# Patient Record
Sex: Male | Born: 1965
Health system: Southern US, Community
[De-identification: ages and names within clinical notes are randomized; demographics above are authoritative.]

## PROBLEM LIST (undated history)

## (undated) DIAGNOSIS — I4891 Unspecified atrial fibrillation: Secondary | ICD-10-CM

## (undated) DIAGNOSIS — F1411 Cocaine abuse, in remission: Secondary | ICD-10-CM

## (undated) DIAGNOSIS — Z720 Tobacco use: Secondary | ICD-10-CM

## (undated) DIAGNOSIS — F1010 Alcohol abuse, uncomplicated: Secondary | ICD-10-CM

## (undated) DIAGNOSIS — F32A Depression, unspecified: Secondary | ICD-10-CM

## (undated) DIAGNOSIS — I480 Paroxysmal atrial fibrillation: Secondary | ICD-10-CM

## (undated) DIAGNOSIS — I639 Cerebral infarction, unspecified: Secondary | ICD-10-CM

## (undated) DIAGNOSIS — I10 Essential (primary) hypertension: Secondary | ICD-10-CM

## (undated) DIAGNOSIS — E119 Type 2 diabetes mellitus without complications: Secondary | ICD-10-CM

## (undated) DIAGNOSIS — F319 Bipolar disorder, unspecified: Secondary | ICD-10-CM

## (undated) DIAGNOSIS — I209 Angina pectoris, unspecified: Secondary | ICD-10-CM

## (undated) DIAGNOSIS — F329 Major depressive disorder, single episode, unspecified: Secondary | ICD-10-CM

## (undated) DIAGNOSIS — E7800 Pure hypercholesterolemia, unspecified: Secondary | ICD-10-CM

## (undated) HISTORY — PX: INGUINAL HERNIA REPAIR: SUR1180

## (undated) HISTORY — PX: CYSTECTOMY: SUR359

---

## 2002-06-18 DIAGNOSIS — I639 Cerebral infarction, unspecified: Secondary | ICD-10-CM

## 2002-06-18 HISTORY — DX: Cerebral infarction, unspecified: I63.9

## 2002-08-27 ENCOUNTER — Ambulatory Visit (HOSPITAL_BASED_OUTPATIENT_CLINIC_OR_DEPARTMENT_OTHER): Admission: RE | Admit: 2002-08-27 | Discharge: 2002-08-27 | Payer: Self-pay | Admitting: General Surgery

## 2002-08-27 ENCOUNTER — Encounter (INDEPENDENT_AMBULATORY_CARE_PROVIDER_SITE_OTHER): Payer: Self-pay | Admitting: Specialist

## 2004-03-26 ENCOUNTER — Emergency Department (HOSPITAL_COMMUNITY): Admission: EM | Admit: 2004-03-26 | Discharge: 2004-03-26 | Payer: Self-pay | Admitting: Emergency Medicine

## 2005-08-18 ENCOUNTER — Ambulatory Visit: Payer: Self-pay | Admitting: Cardiology

## 2005-08-18 ENCOUNTER — Inpatient Hospital Stay (HOSPITAL_COMMUNITY): Admission: EM | Admit: 2005-08-18 | Discharge: 2005-08-22 | Payer: Self-pay | Admitting: Emergency Medicine

## 2005-08-20 ENCOUNTER — Encounter (INDEPENDENT_AMBULATORY_CARE_PROVIDER_SITE_OTHER): Payer: Self-pay | Admitting: *Deleted

## 2005-12-14 ENCOUNTER — Emergency Department (HOSPITAL_COMMUNITY): Admission: EM | Admit: 2005-12-14 | Discharge: 2005-12-14 | Payer: Self-pay | Admitting: Emergency Medicine

## 2010-03-09 ENCOUNTER — Emergency Department (HOSPITAL_COMMUNITY): Admission: EM | Admit: 2010-03-09 | Discharge: 2010-03-09 | Payer: Self-pay | Admitting: Emergency Medicine

## 2010-05-24 ENCOUNTER — Emergency Department (HOSPITAL_COMMUNITY)
Admission: EM | Admit: 2010-05-24 | Discharge: 2010-05-24 | Payer: Self-pay | Source: Home / Self Care | Admitting: Family Medicine

## 2010-06-25 ENCOUNTER — Observation Stay (HOSPITAL_COMMUNITY)
Admission: EM | Admit: 2010-06-25 | Discharge: 2010-06-28 | Payer: Self-pay | Source: Home / Self Care | Attending: Internal Medicine | Admitting: Internal Medicine

## 2010-07-03 LAB — POCT I-STAT, CHEM 8
BUN: 15 mg/dL (ref 6–23)
Calcium, Ion: 1.1 mmol/L — ABNORMAL LOW (ref 1.12–1.32)
Chloride: 101 mEq/L (ref 96–112)
Creatinine, Ser: 1.1 mg/dL (ref 0.4–1.5)
Glucose, Bld: 104 mg/dL — ABNORMAL HIGH (ref 70–99)
HCT: 54 % — ABNORMAL HIGH (ref 39.0–52.0)
Hemoglobin: 18.4 g/dL — ABNORMAL HIGH (ref 13.0–17.0)
Potassium: 4.1 mEq/L (ref 3.5–5.1)
Sodium: 137 mEq/L (ref 135–145)
TCO2: 30 mmol/L (ref 0–100)

## 2010-07-03 LAB — BASIC METABOLIC PANEL
BUN: 14 mg/dL (ref 6–23)
BUN: 7 mg/dL (ref 6–23)
CO2: 31 mEq/L (ref 19–32)
CO2: 27 mEq/L (ref 19–32)
Calcium: 9.4 mg/dL (ref 8.4–10.5)
Calcium: 9.1 mg/dL (ref 8.4–10.5)
Chloride: 100 mEq/L (ref 96–112)
Chloride: 106 mEq/L (ref 96–112)
Creatinine, Ser: 0.82 mg/dL (ref 0.4–1.5)
Creatinine, Ser: 0.74 mg/dL (ref 0.4–1.5)
GFR calc Af Amer: >60 mL/min (ref >60)
GFR calc Af Amer: >60 mL/min (ref >60)
GFR calc non Af Amer: >60 mL/min (ref >60)
GFR calc non Af Amer: >60 mL/min (ref >60)
Glucose, Bld: 148 mg/dL — ABNORMAL HIGH (ref 70–99)
Glucose, Bld: 115 mg/dL — ABNORMAL HIGH (ref 70–99)
Potassium: 3.6 mEq/L (ref 3.5–5.1)
Potassium: 4 mEq/L (ref 3.5–5.1)
Sodium: 139 mEq/L (ref 135–145)
Sodium: 140 mEq/L (ref 135–145)

## 2010-07-03 LAB — COMPREHENSIVE METABOLIC PANEL
ALT: 16 U/L (ref 0–53)
ALT: 17 U/L (ref 0–53)
AST: 18 U/L (ref 0–37)
AST: 18 U/L (ref 0–37)
Albumin: 4.1 g/dL (ref 3.5–5.2)
Albumin: 4 g/dL (ref 3.5–5.2)
Alkaline Phosphatase: 66 U/L (ref 39–117)
Alkaline Phosphatase: 63 U/L (ref 39–117)
BUN: 12 mg/dL (ref 6–23)
BUN: 11 mg/dL (ref 6–23)
CO2: 26 mEq/L (ref 19–32)
CO2: 28 mEq/L (ref 19–32)
Calcium: 9.5 mg/dL (ref 8.4–10.5)
Calcium: 9.3 mg/dL (ref 8.4–10.5)
Chloride: 99 mEq/L (ref 96–112)
Chloride: 102 mEq/L (ref 96–112)
Creatinine, Ser: 0.98 mg/dL (ref 0.4–1.5)
Creatinine, Ser: 0.86 mg/dL (ref 0.4–1.5)
GFR calc Af Amer: >60 mL/min (ref >60)
GFR calc Af Amer: >60 mL/min (ref >60)
GFR calc non Af Amer: >60 mL/min (ref >60)
GFR calc non Af Amer: >60 mL/min (ref >60)
Glucose, Bld: 102 mg/dL — ABNORMAL HIGH (ref 70–99)
Glucose, Bld: 115 mg/dL — ABNORMAL HIGH (ref 70–99)
Potassium: 4.2 mEq/L (ref 3.5–5.1)
Potassium: 4.1 mEq/L (ref 3.5–5.1)
Sodium: 134 mEq/L — ABNORMAL LOW (ref 135–145)
Sodium: 138 mEq/L (ref 135–145)
Total Bilirubin: 0.6 mg/dL (ref 0.3–1.2)
Total Bilirubin: 0.9 mg/dL (ref 0.3–1.2)
Total Protein: 7.3 g/dL (ref 6.0–8.3)
Total Protein: 7 g/dL (ref 6.0–8.3)

## 2010-07-03 LAB — CBC
HCT: 50.5 % (ref 39.0–52.0)
HCT: 50.4 % (ref 39.0–52.0)
HCT: 48.6 % (ref 39.0–52.0)
HCT: 47.8 % (ref 39.0–52.0)
Hemoglobin: 17.6 g/dL — ABNORMAL HIGH (ref 13.0–17.0)
Hemoglobin: 17.2 g/dL — ABNORMAL HIGH (ref 13.0–17.0)
Hemoglobin: 16.6 g/dL (ref 13.0–17.0)
Hemoglobin: 16 g/dL (ref 13.0–17.0)
MCH: 31.5 pg (ref 26.0–34.0)
MCH: 31 pg (ref 26.0–34.0)
MCH: 31.1 pg (ref 26.0–34.0)
MCH: 30.7 pg (ref 26.0–34.0)
MCHC: 34.9 g/dL (ref 30.0–36.0)
MCHC: 34.1 g/dL (ref 30.0–36.0)
MCHC: 34.2 g/dL (ref 30.0–36.0)
MCHC: 33.5 g/dL (ref 30.0–36.0)
MCV: 90.5 fL (ref 78.0–100.0)
MCV: 91 fL (ref 78.0–100.0)
MCV: 91.2 fL (ref 78.0–100.0)
MCV: 91.6 fL (ref 78.0–100.0)
Platelets: 293 10*3/uL (ref 150–400)
Platelets: 270 10*3/uL (ref 150–400)
Platelets: 234 10*3/uL (ref 150–400)
Platelets: 247 10*3/uL (ref 150–400)
RBC: 5.58 MIL/uL (ref 4.22–5.81)
RBC: 5.54 MIL/uL (ref 4.22–5.81)
RBC: 5.33 MIL/uL (ref 4.22–5.81)
RBC: 5.22 MIL/uL (ref 4.22–5.81)
RDW: 13 % (ref 11.5–15.5)
RDW: 13.2 % (ref 11.5–15.5)
RDW: 13.1 % (ref 11.5–15.5)
RDW: 12.9 % (ref 11.5–15.5)
WBC: 18.2 10*3/uL — ABNORMAL HIGH (ref 4.0–10.5)
WBC: 12.6 10*3/uL — ABNORMAL HIGH (ref 4.0–10.5)
WBC: 11 10*3/uL — ABNORMAL HIGH (ref 4.0–10.5)
WBC: 11.6 10*3/uL — ABNORMAL HIGH (ref 4.0–10.5)

## 2010-07-03 LAB — LIPASE, BLOOD: Lipase: 19 U/L (ref 11–59)

## 2010-07-03 LAB — URINALYSIS, ROUTINE W REFLEX MICROSCOPIC
Bilirubin Urine: NEGATIVE
Hgb urine dipstick: NEGATIVE
Ketones, ur: NEGATIVE mg/dL
Nitrite: NEGATIVE
Protein, ur: NEGATIVE mg/dL
Specific Gravity, Urine: 1.018 (ref 1.005–1.030)
Urine Glucose, Fasting: NEGATIVE mg/dL
Urobilinogen, UA: 1 mg/dL (ref 0.0–1.0)
pH: 6 (ref 5.0–8.0)

## 2010-07-03 LAB — CK TOTAL AND CKMB (NOT AT ARMC)
CK, MB: 1.6 ng/mL (ref 0.3–4.0)
CK, MB: 2.6 ng/mL (ref 0.3–4.0)
Relative Index: INVALID (ref 0.0–2.5)
Relative Index: INVALID (ref 0.0–2.5)
Total CK: 76 U/L (ref 7–232)
Total CK: 79 U/L (ref 7–232)

## 2010-07-03 LAB — CARDIAC PANEL(CRET KIN+CKTOT+MB+TROPI)
CK, MB: 2.4 ng/mL (ref 0.3–4.0)
CK, MB: 1.8 ng/mL (ref 0.3–4.0)
Relative Index: INVALID (ref 0.0–2.5)
Relative Index: INVALID (ref 0.0–2.5)
Total CK: 68 U/L (ref 7–232)
Total CK: 64 U/L (ref 7–232)
Troponin I: 0.16 ng/mL — ABNORMAL HIGH (ref 0.00–0.06)
Troponin I: 0.14 ng/mL — ABNORMAL HIGH (ref 0.00–0.06)

## 2010-07-03 LAB — RAPID URINE DRUG SCREEN, HOSP PERFORMED
Amphetamines: NOT DETECTED
Barbiturates: NOT DETECTED
Benzodiazepines: NOT DETECTED
Cocaine: NOT DETECTED
Opiates: NOT DETECTED
Tetrahydrocannabinol: POSITIVE — AB

## 2010-07-03 LAB — LIPID PANEL
Cholesterol: 225 mg/dL — ABNORMAL HIGH (ref 0–200)
HDL: 44 mg/dL (ref >39)
LDL Cholesterol: 159 mg/dL — ABNORMAL HIGH (ref 0–99)
Total CHOL/HDL Ratio: 5.1 RATIO
Triglycerides: 108 mg/dL (ref <150)
VLDL: 22 mg/dL (ref 0–40)

## 2010-07-03 LAB — HEPARIN LEVEL (UNFRACTIONATED)
Heparin Unfractionated: <0.1 IU/mL — ABNORMAL LOW (ref 0.30–0.70)
Heparin Unfractionated: 0.15 IU/mL — ABNORMAL LOW (ref 0.30–0.70)
Heparin Unfractionated: <0.1 IU/mL — ABNORMAL LOW (ref 0.30–0.70)

## 2010-07-03 LAB — POCT CARDIAC MARKERS
CKMB, poc: <1 ng/mL — ABNORMAL LOW (ref 1.0–8.0)
Myoglobin, poc: 36.3 ng/mL (ref 12–200)
Troponin i, poc: <0.05 ng/mL (ref 0.00–0.09)

## 2010-07-03 LAB — TROPONIN I
Troponin I: 0.06 ng/mL (ref 0.00–0.06)
Troponin I: 0.15 ng/mL — ABNORMAL HIGH (ref 0.00–0.06)

## 2010-07-03 LAB — HEMOGLOBIN A1C
Hgb A1c MFr Bld: 6.1 % — ABNORMAL HIGH (ref <5.7)
Mean Plasma Glucose: 128 mg/dL — ABNORMAL HIGH (ref <117)

## 2010-07-03 LAB — PROTIME-INR
INR: 0.92 (ref 0.00–1.49)
Prothrombin Time: 12.6 seconds (ref 11.6–15.2)

## 2010-07-03 LAB — TSH: TSH: 0.934 u[IU]/mL (ref 0.350–4.500)

## 2010-07-03 LAB — GLUCOSE, CAPILLARY: Glucose-Capillary: 171 mg/dL — ABNORMAL HIGH (ref 70–99)

## 2010-07-06 ENCOUNTER — Emergency Department (HOSPITAL_COMMUNITY)
Admission: EM | Admit: 2010-07-06 | Discharge: 2010-07-07 | Payer: Self-pay | Source: Home / Self Care | Admitting: Emergency Medicine

## 2010-07-10 LAB — DIFFERENTIAL
Basophils Absolute: 0 10*3/uL (ref 0.0–0.1)
Basophils Relative: 0 % (ref 0–1)
Eosinophils Absolute: 0.2 10*3/uL (ref 0.0–0.7)
Eosinophils Relative: 1 % (ref 0–5)
Lymphocytes Relative: 34 % (ref 12–46)
Lymphs Abs: 5.2 10*3/uL — ABNORMAL HIGH (ref 0.7–4.0)
Monocytes Absolute: 1.2 10*3/uL — ABNORMAL HIGH (ref 0.1–1.0)
Monocytes Relative: 8 % (ref 3–12)
Neutro Abs: 8.8 10*3/uL — ABNORMAL HIGH (ref 1.7–7.7)
Neutrophils Relative %: 57 % (ref 43–77)

## 2010-07-10 LAB — POCT I-STAT, CHEM 8
BUN: 14 mg/dL (ref 6–23)
Calcium, Ion: 1.09 mmol/L — ABNORMAL LOW (ref 1.12–1.32)
Chloride: 97 mEq/L (ref 96–112)
Creatinine, Ser: 1 mg/dL (ref 0.4–1.5)
Glucose, Bld: 112 mg/dL — ABNORMAL HIGH (ref 70–99)
HCT: 52 % (ref 39.0–52.0)
Hemoglobin: 17.7 g/dL — ABNORMAL HIGH (ref 13.0–17.0)
Potassium: 3.6 mEq/L (ref 3.5–5.1)
Sodium: 135 mEq/L (ref 135–145)
TCO2: 30 mmol/L (ref 0–100)

## 2010-07-10 LAB — CBC
HCT: 49.1 % (ref 39.0–52.0)
Hemoglobin: 16.7 g/dL (ref 13.0–17.0)
MCH: 30.6 pg (ref 26.0–34.0)
MCHC: 34 g/dL (ref 30.0–36.0)
MCV: 90.1 fL (ref 78.0–100.0)
Platelets: 303 10*3/uL (ref 150–400)
RBC: 5.45 MIL/uL (ref 4.22–5.81)
RDW: 13 % (ref 11.5–15.5)
WBC: 15.5 10*3/uL — ABNORMAL HIGH (ref 4.0–10.5)

## 2010-07-10 LAB — POCT CARDIAC MARKERS
CKMB, poc: <1 ng/mL — ABNORMAL LOW (ref 1.0–8.0)
CKMB, poc: <1 ng/mL — ABNORMAL LOW (ref 1.0–8.0)
CKMB, poc: <1 ng/mL — ABNORMAL LOW (ref 1.0–8.0)
Myoglobin, poc: 29.9 ng/mL (ref 12–200)
Myoglobin, poc: 28.3 ng/mL (ref 12–200)
Myoglobin, poc: 34.3 ng/mL (ref 12–200)
Troponin i, poc: <0.05 ng/mL (ref 0.00–0.09)
Troponin i, poc: <0.05 ng/mL (ref 0.00–0.09)
Troponin i, poc: <0.05 ng/mL (ref 0.00–0.09)

## 2010-07-27 NOTE — Procedures (Signed)
  NAME:  Gary Buck, Gary Buck NO.:  [id number]  MEDICAL RECORD NO.:  [id number]          PATIENT TYPE:  OBV  LOCATION:  3740                         FACILITY:  [facility]  PHYSICIAN:  Marca Ancona, MD      DATE OF BIRTH:  Jan 29, 1966  DATE OF PROCEDURE: DATE OF DISCHARGE:                           CARDIAC CATHETERIZATION   PROCEDURE: 1. Left heart catheterization. 2. Coronary angiography. 3. Left ventriculography.  INDICATIONS:  This is a 45 year old with history of cocaine and alcohol abuse as well as smoking who presented with syncope and chest pain and had a mildly elevated troponin level consistent with possible subendocardial MI.  PROCEDURE NOTE:  After informed consent was obtained, the patient underwent Allen testing on his right wrist.  This was positive, signifying good collateral circulation from the ulnar artery to the radial side of the hand.  The right wrist was then sterilely prepped and draped; 1% lidocaine was used to locally anesthetize the right radial area.  The right radial artery was entered using modified Seldinger technique, and a 5-French arterial sheath was placed.  The right coronary artery was engaged using the JR-4 catheter.  The left coronary artery was engaged using the JL-3.5 catheter, and the left ventricle was entered using angled pigtail catheter.  There were no complications.  FINDINGS: 1. Hemodynamics.  Aorta 133/92, LV 134/18. 2. Left ventriculography.  EF was estimated to be 60% with no regional     wall motion abnormalities. 3. Right coronary artery.  The right coronary was a dominant vessel     with only luminal irregularities. 4. Left main.  Left main had no angiographic disease. 5. Left circumflex system.  The left circumflex gave rise to a large     first obtuse marginal, and the remainder of the AV circumflex was     small.  There were mild luminal irregularities in the circumflex     system. 6. LAD system.  There was a  smooth 30% proximal LAD stenosis,     otherwise mild luminal irregularities in the LAD.  IMPRESSION:  Nonobstructive coronary disease.  The elevated troponin may be secondary to cocaine-induced vasospasm.  We will continue aspirin and avoid beta blocker.     Marca Ancona, MD     DM/MEDQ  D:  06/27/2010  T:  06/28/2010  Job:  [id number]  Electronically Signed by Marca Ancona MD on 07/27/2010 08:29:06 AM

## 2010-08-31 LAB — BASIC METABOLIC PANEL
BUN: 12 mg/dL (ref 6–23)
CO2: 24 mEq/L (ref 19–32)
Calcium: 9.1 mg/dL (ref 8.4–10.5)
Chloride: 100 mEq/L (ref 96–112)
Creatinine, Ser: 1.09 mg/dL (ref 0.4–1.5)
GFR calc Af Amer: >60 mL/min (ref >60)
GFR calc non Af Amer: >60 mL/min (ref >60)
Glucose, Bld: 103 mg/dL — ABNORMAL HIGH (ref 70–99)
Potassium: 3.7 mEq/L (ref 3.5–5.1)
Sodium: 136 mEq/L (ref 135–145)
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
## 2010-11-03 NOTE — Consult Note (Signed)
NAME:  Gary Buck, Gary Buck NO.:  [id number]   MEDICAL RECORD NO.:  [id number]          PATIENT TYPE:  INP   LOCATION:  1408                         FACILITY:  [facility]   PHYSICIAN:  Rollene Rotunda, M.D.   DATE OF BIRTH:  05/16/1966   DATE OF CONSULTATION:  DATE OF DISCHARGE:                                   CONSULTATION   Gary Buck is a 45 year old male with no known history of coronary artery  disease and with a previous normal myocardial perfusion study in 2003, now  referred for evaluation of chest pain. The patient is awaiting cardiac  clearance prior to being transferred to [facility].   The patient presents with an admitted history of polysubstance abuse  including cocaine and was found to have positive cocaine by urine drug study  this admission.   The patient reports a history of essentially a stable pattern of angina for  the past year. He describes substernal chest pressure which is moderate in  intensity with radiation down the left arm, and which happens on a  consistent basis if the crack is pure. He does admit that the pain goes  once he stops using the cocaine.   The patient also describes a history of chest discomfort with either extreme  emotional or physical exertion. However, he denies any resting chest pain.  His chest pain does resolve with rest and has no associated symptoms.  Additionally, there has been no increase in frequency or severity in the  recent past.   The patient denies any dyspnea, paroxysmal nocturnal dyspnea, palpitations,  presyncope, or syncope.   The patient was admitted after a recent binge and was to have gone to  [facility] facility, but complained of chest pain. Admission EKG  suggests early repolarization pattern with no acute changes. Serial cardiac  markers have been negative.   ALLERGIES:  No known drug allergies.   MEDICATIONS PRIOR TO ADMISSION:  None.   PAST MEDICAL HISTORY:  1.   Hypertension.  2.  Bipolar disorder.  3.  Asthma.   SOCIAL HISTORY:  The patient is not married but does have two daughters. He  is currently unemployed. He has been smoking tobacco since a young age and  drinks as often as he can. He also admits to crack cocaine use as well as  marijuana.   FAMILY HISTORY:  The patient does not know his biological father. His mother  is still alive and has diabetes with no known heart disease. He has a  brother who has a heart murmur.   REVIEW OF SYSTEMS:  Negative except as stated in HPI.   LABORATORY DATA:  Sodium 139, potassium 3.4, glucose 139, BUN 7, creatinine  0.8. Liver enzymes normal. CPK-MB and troponin I markers normal (two sets),  and one set of negative (POC) cardiac markers. Lipid profile:  Total  cholesterol 133, triglyceride 80, HDL 45, LDL 72. Urine drug screen:  Positive for cocaine and cannabinoid. Alcohol level less than 5. Admission  chest x-ray:  Cardiomegaly, no acute cardiopulmonary disease.   PHYSICAL EXAMINATION:  VITAL SIGNS:  Blood pressure  138/85, temperature 98,  pulse 62, respirations 20, saturations 99% on room air.  GENERAL:  A 45 year old male, sitting upright, in no apparent.  HEENT:  Normocephalic, atraumatic. PERRL. Sclerae are clear.  NECK:  Preserved bilateral carotid pulses without bruit; no thyromegaly; no  JVD.  LYMPHATIC:  No adenopathy.  LUNGS:  No wheezes or crackles.  HEART:  Regular rate and rhythm (S1, S2). No murmurs, rubs, or gallops.  Physiologic split S2.  ABDOMEN:  Soft, nontender, intact bowel sounds. No rebound, guarding, or  organomegaly.  EXTREMITIES:  Show 2+ bilateral femoral pulses without bruits, no peripheral  edema.  MUSCULOSKELETAL:  No CVA tenderness.  NEUROLOGIC:  Cranial nerves II-XII grossly intact. Motor function intact  bilaterally. Alert and oriented.   IMPRESSION:  Gary Buck is a 45 year old male with no known coronary  artery disease and a normal adenosine perfusion  study in 2003 who is an  admitted crack cocaine user, who now is referred for evaluation of chest  pain.   Since admission, serial cardiac markers have been normal and  electrocardiogram is suggestive of early repolarization with no acute  changes.   PLAN:  The patient's chest pain is associated with crack cocaine use but  has a stable pattern. We reviewed the FAX results of the adenosine stress  test from 2003 [facility]) which showed normal perfusion and normal  left ventricular function. Given that his symptoms remain stable and have  not progressed, I would not suggest any further stress testing at this time.  However, he is at very high risk for a myocardial infarction in the future  with continued cocaine use. We also advise avoiding beta blockers.   Admission chest x-ray did suggest cardiomegaly and we will therefore check a  2-D echocardiogram.      Gary Buck, P.A. [facility]    ______________________________  Rollene Rotunda, M.D.    GS/MEDQ  D:  08/20/2005  T:  08/20/2005  Job:  [id number]   cc:   Lonia Blood, M.D.

## 2010-11-03 NOTE — Op Note (Signed)
   NAME:  Gary Buck, Gary Buck NO.:  [id number]   MEDICAL RECORD NO.:  [id number]                   PATIENT TYPE:  AMB   LOCATION:  DSC                                  FACILITY:  [facility]   PHYSICIAN:  Ollen Gross. Vernell Morgans, M.D.              DATE OF BIRTH:  May 21, 1966   DATE OF PROCEDURE:  08/27/2002  DATE OF DISCHARGE:                                 OPERATIVE REPORT   PREOPERATIVE DIAGNOSIS:  Mass on the forehead.   POSTOPERATIVE DIAGNOSIS:  Mass on the forehead.   PROCEDURE:  Excision of mass on the forehead.   SURGEON:  Ollen Gross. Carolynne Edouard, M.D.   ANESTHESIA:  General via LMA.   DESCRIPTION OF PROCEDURE:  After informed consent was obtained the patient  was brought to the operating room and placed in the supine position on the  operating table. After adequate induction  of general anesthesia the  patient's forehead was scrubbed with Betadine and draped in the usual  sterile manner. The area in question was infiltrated with 1% Lidocaine with  epinephrine.   An elliptical incision was made overlying the mass. This was incision was  carried down through the skin and subcutaneous tissues sharply using a #15  blade knife and  Metzenbaum scissors. The mass was then excised sharply with  the scissors until it was completely free from the patient. The mass was  then sent to pathology for further evaluation.   The wound was inspected and found to be hemostatic. The incision was then  closed with a running 3-0 nylon stitch. Neosporin and sterile dressings were  applied.   The patient tolerated the procedure well. All sponge, instrument and needle  count at the end of the case. The patient was  then awakened and taken to  the recovery room in stable condition.                                               Ollen Gross. Vernell Morgans, M.D.    PST/MEDQ  D:  08/28/2002  T:  08/29/2002  Job:  [id number]

## 2014-12-21 ENCOUNTER — Emergency Department (HOSPITAL_COMMUNITY): Payer: 59

## 2014-12-21 ENCOUNTER — Encounter (HOSPITAL_COMMUNITY): Payer: Self-pay | Admitting: Emergency Medicine

## 2014-12-21 ENCOUNTER — Observation Stay (HOSPITAL_COMMUNITY)
Admission: EM | Admit: 2014-12-21 | Discharge: 2014-12-24 | Disposition: A | Discharge status: Home / Self Care | Payer: 59 | Attending: Internal Medicine | Admitting: Internal Medicine

## 2014-12-21 DIAGNOSIS — I10 Essential (primary) hypertension: Secondary | ICD-10-CM | POA: Insufficient documentation

## 2014-12-21 DIAGNOSIS — I25118 Atherosclerotic heart disease of native coronary artery with other forms of angina pectoris: Secondary | ICD-10-CM

## 2014-12-21 DIAGNOSIS — I2510 Atherosclerotic heart disease of native coronary artery without angina pectoris: Secondary | ICD-10-CM | POA: Diagnosis present

## 2014-12-21 DIAGNOSIS — R778 Other specified abnormalities of plasma proteins: Secondary | ICD-10-CM | POA: Insufficient documentation

## 2014-12-21 DIAGNOSIS — Z8673 Personal history of transient ischemic attack (TIA), and cerebral infarction without residual deficits: Secondary | ICD-10-CM

## 2014-12-21 DIAGNOSIS — R0602 Shortness of breath: Secondary | ICD-10-CM | POA: Diagnosis not present

## 2014-12-21 DIAGNOSIS — F1910 Other psychoactive substance abuse, uncomplicated: Secondary | ICD-10-CM | POA: Diagnosis present

## 2014-12-21 DIAGNOSIS — Z720 Tobacco use: Secondary | ICD-10-CM | POA: Insufficient documentation

## 2014-12-21 DIAGNOSIS — G930 Cerebral cysts: Secondary | ICD-10-CM | POA: Diagnosis not present

## 2014-12-21 DIAGNOSIS — R7989 Other specified abnormal findings of blood chemistry: Secondary | ICD-10-CM

## 2014-12-21 DIAGNOSIS — I4891 Unspecified atrial fibrillation: Secondary | ICD-10-CM

## 2014-12-21 DIAGNOSIS — Z79899 Other long term (current) drug therapy: Secondary | ICD-10-CM | POA: Insufficient documentation

## 2014-12-21 DIAGNOSIS — R5383 Other fatigue: Secondary | ICD-10-CM | POA: Insufficient documentation

## 2014-12-21 DIAGNOSIS — R072 Precordial pain: Secondary | ICD-10-CM

## 2014-12-21 DIAGNOSIS — R079 Chest pain, unspecified: Principal | ICD-10-CM

## 2014-12-21 HISTORY — DX: Depression, unspecified: F32.A

## 2014-12-21 HISTORY — DX: Alcohol abuse, uncomplicated: F10.10

## 2014-12-21 HISTORY — DX: Cerebral infarction, unspecified: I63.9

## 2014-12-21 HISTORY — DX: Essential (primary) hypertension: I10

## 2014-12-21 HISTORY — DX: Cocaine abuse, in remission: F14.11

## 2014-12-21 HISTORY — DX: Pure hypercholesterolemia, unspecified: E78.00

## 2014-12-21 HISTORY — DX: Type 2 diabetes mellitus without complications: E11.9

## 2014-12-21 HISTORY — DX: Tobacco use: Z72.0

## 2014-12-21 HISTORY — DX: Major depressive disorder, single episode, unspecified: F32.9

## 2014-12-21 HISTORY — DX: Paroxysmal atrial fibrillation: I48.0

## 2014-12-21 HISTORY — DX: Angina pectoris, unspecified: I20.9

## 2014-12-21 HISTORY — DX: Bipolar disorder, unspecified: F31.9

## 2014-12-21 LAB — URINALYSIS, ROUTINE W REFLEX MICROSCOPIC
Bilirubin Urine: NEGATIVE
GLUCOSE, UA: NEGATIVE mg/dL
Ketones, ur: 40 mg/dL — AB
LEUKOCYTES UA: NEGATIVE
NITRITE: NEGATIVE
PROTEIN: 100 mg/dL — AB
Specific Gravity, Urine: 1.012 (ref 1.005–1.030)
Urobilinogen, UA: 0.2 mg/dL (ref 0.0–1.0)
pH: 7 (ref 5.0–8.0)

## 2014-12-21 LAB — URINE MICROSCOPIC-ADD ON

## 2014-12-21 LAB — BASIC METABOLIC PANEL
Anion gap: 15 (ref 5–15)
BUN: 10 mg/dL (ref 6–20)
CHLORIDE: 106 mmol/L (ref 101–111)
CO2: 21 mmol/L — ABNORMAL LOW (ref 22–32)
Calcium: 9.1 mg/dL (ref 8.9–10.3)
Creatinine, Ser: 1.45 mg/dL — ABNORMAL HIGH (ref 0.61–1.24)
GFR calc non Af Amer: 56 mL/min — ABNORMAL LOW (ref >60)
GLUCOSE: 86 mg/dL (ref 65–99)
Potassium: 4 mmol/L (ref 3.5–5.1)
Sodium: 142 mmol/L (ref 135–145)

## 2014-12-21 LAB — CBC WITH DIFFERENTIAL/PLATELET
Basophils Absolute: 0 10*3/uL (ref 0.0–0.1)
Basophils Relative: 0 % (ref 0–1)
Eosinophils Absolute: 0.1 10*3/uL (ref 0.0–0.7)
Eosinophils Relative: 0 % (ref 0–5)
HEMATOCRIT: 53 % — AB (ref 39.0–52.0)
HEMOGLOBIN: 17.9 g/dL — AB (ref 13.0–17.0)
LYMPHS ABS: 2.1 10*3/uL (ref 0.7–4.0)
LYMPHS PCT: 10 % — AB (ref 12–46)
MCH: 30.8 pg (ref 26.0–34.0)
MCHC: 33.8 g/dL (ref 30.0–36.0)
MCV: 91.1 fL (ref 78.0–100.0)
MONO ABS: 1.6 10*3/uL — AB (ref 0.1–1.0)
Monocytes Relative: 8 % (ref 3–12)
NEUTROS ABS: 16.4 10*3/uL — AB (ref 1.7–7.7)
Neutrophils Relative %: 82 % — ABNORMAL HIGH (ref 43–77)
Platelets: 338 10*3/uL (ref 150–400)
RBC: 5.82 MIL/uL — ABNORMAL HIGH (ref 4.22–5.81)
RDW: 14 % (ref 11.5–15.5)
WBC: 20.1 10*3/uL — AB (ref 4.0–10.5)

## 2014-12-21 LAB — RAPID URINE DRUG SCREEN, HOSP PERFORMED
Amphetamines: NOT DETECTED
Barbiturates: NOT DETECTED
Benzodiazepines: NOT DETECTED
COCAINE: NOT DETECTED
OPIATES: NOT DETECTED
Tetrahydrocannabinol: NOT DETECTED

## 2014-12-21 LAB — I-STAT TROPONIN, ED: TROPONIN I, POC: 0.04 ng/mL (ref 0.00–0.08)

## 2014-12-21 LAB — ETHANOL

## 2014-12-21 LAB — TSH: TSH: 0.53 u[IU]/mL (ref 0.350–4.500)

## 2014-12-21 LAB — TROPONIN I: Troponin I: 0.37 ng/mL — ABNORMAL HIGH (ref <0.031)

## 2014-12-21 MED ORDER — SODIUM CHLORIDE 0.9 % IV SOLN: 250.0000 mL | INTRAVENOUS | Status: DC

## 2014-12-21 MED ORDER — SODIUM CHLORIDE 0.9 % IJ SOLN
3.0000 mL | Freq: Two times a day (BID) | INTRAMUSCULAR | Status: DC
  Administered 2014-12-22 – 2014-12-24 (×4): 3 mL via INTRAVENOUS

## 2014-12-21 MED ORDER — ACETAMINOPHEN 325 MG PO TABS
650.0000 mg | ORAL_TABLET | Freq: Four times a day (QID) | ORAL | Status: DC | PRN
  Administered 2014-12-22: 650 mg via ORAL
  Filled 2014-12-21: qty 2

## 2014-12-21 MED ORDER — ONDANSETRON HCL 4 MG/2ML IJ SOLN: 4.0000 mg | Freq: Four times a day (QID) | INTRAMUSCULAR | Status: DC | PRN

## 2014-12-21 MED ORDER — NITROGLYCERIN 0.4 MG SL SUBL
SUBLINGUAL_TABLET | SUBLINGUAL | Status: AC
  Administered 2014-12-21: 0.4 mg
  Filled 2014-12-21: qty 1

## 2014-12-21 MED ORDER — SIMVASTATIN 20 MG PO TABS
20.0000 mg | ORAL_TABLET | Freq: Every day | ORAL | Status: DC
  Administered 2014-12-22: 20 mg via ORAL
  Filled 2014-12-21 (×2): qty 1

## 2014-12-21 MED ORDER — APIXABAN 5 MG PO TABS
5.0000 mg | ORAL_TABLET | Freq: Two times a day (BID) | ORAL | Status: DC
  Administered 2014-12-21 – 2014-12-22 (×2): 5 mg via ORAL
  Filled 2014-12-21 (×2): qty 1

## 2014-12-21 MED ORDER — DILTIAZEM LOAD VIA INFUSION
20.0000 mg | Freq: Once | INTRAVENOUS | Status: AC
  Administered 2014-12-21: 20 mg via INTRAVENOUS
  Filled 2014-12-21: qty 20

## 2014-12-21 MED ORDER — SODIUM CHLORIDE 0.9 % IJ SOLN
3.0000 mL | INTRAMUSCULAR | Status: DC | PRN
  Administered 2014-12-22: 3 mL via INTRAVENOUS
  Filled 2014-12-21: qty 3

## 2014-12-21 MED ORDER — DILTIAZEM HCL 100 MG IV SOLR
5.0000 mg/h | INTRAVENOUS | Status: DC
  Administered 2014-12-21: 10 mg/h via INTRAVENOUS
  Administered 2014-12-21: 15 mg/h via INTRAVENOUS

## 2014-12-21 MED ORDER — HYDROCODONE-ACETAMINOPHEN 7.5-325 MG/15ML PO SOLN: 15.0000 mL | Freq: Three times a day (TID) | ORAL | Status: DC | PRN

## 2014-12-21 NOTE — ED Provider Notes (Signed)
CSN: [id number]     Arrival date & time 12/21/14  1306 History   First MD Initiated Contact with Patient 12/21/14 1310     Chief Complaint  Patient presents with  . Chest Pain     (Consider location/radiation/quality/duration/timing/severity/associated sxs/prior Treatment) HPI Comments: Patient with history of hypertension and stroke presents to the emergency department with chief complaint of chest pain, shortness of breath, and weakness that started early this morning. He states that he got up to use the bathroom, but felt very weak. He states that he has some associated nausea and diaphoresis. He has not vomited. His symptoms worsen with exertion. He denies any aggravating or alleviating factors. He states that he has not been taking his lisinopril.  The history is provided by the patient. No language interpreter was used.    Past Medical History  Diagnosis Date  . Hypertension   . Stroke 2004    pt sts about 12 years ago.   . Cyst of brain     removed   Past Surgical History  Procedure Laterality Date  . Brain surgery      cyst removed    History reviewed. No pertinent family history. History  Substance Use Topics  . Smoking status: Current Every Day Smoker  . Smokeless tobacco: Not on file  . Alcohol Use: Yes    Review of Systems  Constitutional: Positive for fatigue. Negative for fever and chills.  Respiratory: Positive for shortness of breath.   Cardiovascular: Positive for chest pain.  Gastrointestinal: Negative for nausea, vomiting, diarrhea and constipation.  Genitourinary: Negative for dysuria.  All other systems reviewed and are negative.     Allergies  Review of patient's allergies indicates no known allergies.  Home Medications   Prior to Admission medications   Medication Sig Start Date End Date Taking? Authorizing Provider  lisinopril-hydrochlorothiazide (PRINZIDE,ZESTORETIC) 20-25 MG per tablet Take 1 tablet by mouth daily.   Yes Historical  Provider, MD  simvastatin (ZOCOR) 20 MG tablet Take 20 mg by mouth daily.   Yes Historical Provider, MD   BP 129/105 mmHg  Pulse 135  Resp 27  SpO2 97% Physical Exam  Constitutional: He is oriented to person, place, and time. He appears well-developed and well-nourished.  HENT:  Head: Normocephalic and atraumatic.  Eyes: Conjunctivae and EOM are normal. Pupils are equal, round, and reactive to light. Right eye exhibits no discharge. Left eye exhibits no discharge. No scleral icterus.  Neck: Normal range of motion. Neck supple. No JVD present.  Cardiovascular: Normal heart sounds.  Exam reveals no gallop and no friction rub.   No murmur heard. Irregularly irregular, tachycardic  Pulmonary/Chest: Effort normal and breath sounds normal. No respiratory distress. He has no wheezes. He has no rales. He exhibits no tenderness.  Abdominal: Soft. He exhibits no distension and no mass. There is no tenderness. There is no rebound and no guarding.  Musculoskeletal: Normal range of motion. He exhibits no edema or tenderness.  Neurological: He is alert and oriented to person, place, and time.  Skin: Skin is warm and dry.  Psychiatric: He has a normal mood and affect. His behavior is normal. Judgment and thought content normal.  Nursing note and vitals reviewed.   ED Course  Procedures (including critical care time) Labs Review Labs Reviewed  CBC WITH DIFFERENTIAL/PLATELET  BASIC METABOLIC PANEL  Rosezena Sensor, ED    Imaging Review Dg Chest Port 1 View  12/21/2014   CLINICAL DATA:  49 year old male with chest pain  since last night. Nausea and diaphoresis. Initial encounter.  EXAM: PORTABLE CHEST - 1 VIEW  COMPARISON:  07/07/2010.  FINDINGS: Portable AP semi upright view at 1337 hrs. Lordotic view. Stable cardiac and mediastinal contours allowing for positioning and portable technique. Allowing for portable technique, the lungs are clear. No pneumothorax.  IMPRESSION: No acute cardiopulmonary  abnormality.   Electronically Signed   By: Odessa FlemingH  Hall M.D.   On: 12/21/2014 13:44     EKG Interpretation None      MDM   Final diagnoses:  Chest pain  Atrial fibrillation, unspecified    Patient with chest pain, SOB, and fatigue.  Will check labs, EKG, and CXR.  EKG remarkable for a-fib.  Likely new onset.  No hx of the same.  Not anticoagulated.  Hx of stroke and htn.  Will start cardizem drip.  Patient discussed with Dr. Jeraldine LootsLockwood, who agrees with the plan.   Medications  diltiazem (CARDIZEM) 1 mg/mL load via infusion 20 mg (20 mg Intravenous Given 12/21/14 1354)    And  diltiazem (CARDIZEM) 100 mg in dextrose 5 % 100 mL (1 mg/mL) infusion (10 mg/hr Intravenous New Bag/Given 12/21/14 1354)     CRITICAL CARE Performed by: Roxy HorsemanBROWNING, Abdulraheem Pineo   Total critical care time: 45  Critical care time was exclusive of separately billable procedures and treating other patients.  Critical care was necessary to treat or prevent imminent or life-threatening deterioration.  Critical care was time spent personally by me on the following activities: development of treatment plan with patient and/or surrogate as well as nursing, discussions with consultants, evaluation of patient's response to treatment, examination of patient, obtaining history from patient or surrogate, ordering and performing treatments and interventions, ordering and review of laboratory studies, ordering and review of radiographic studies, pulse oximetry and re-evaluation of patient's condition.    Roxy Horsemanobert Christinia Lambeth, PA-C 12/21/14 1455  Roxy Horsemanobert Yacob Wilkerson, PA-C 12/21/14 1456  Gerhard Munchobert Lockwood, MD 12/22/14 [phone]

## 2014-12-21 NOTE — ED Notes (Signed)
Pt reports chest pain that began last night.  Pain worsens with movement.  Pt also reports some nausea and diaphoresis.  Pt has hx of htn but reports he has been out of his Lisinopril.

## 2014-12-21 NOTE — ED Notes (Signed)
Port X-ray at bedside.  

## 2014-12-21 NOTE — ED Notes (Signed)
Pt given sprite. Md at bedside

## 2014-12-21 NOTE — H&P (Signed)
Patient ID: Gary Buck MRN: [id number], DOB/AGE: [birth year]   Admit date: 12/21/2014   Primary Physician: No primary care provider on file. Primary Cardiologist: new  Pt. Profile:  Gary Buck is obese 49 year old African-American male with past medical history of HTN, HLD, stroke 12 yrs ago, however no past cardiac history who present with CP and newly diagnosed a-fib with RVR since AM of 7/5.  Problem List  Past Medical History  Diagnosis Date  . Hypertension   . Stroke 2004    pt sts about 12 years ago.   . Cyst of brain     removed  . ETOH abuse   . Tobacco abuse   . H/O cocaine abuse     Past Surgical History  Procedure Laterality Date  . Brain surgery      cyst removed   . Hernia repair       Allergies  No Known Allergies  HPI  Gary Buck is obese 49 year old African-American male with past medical history of HTN, HLD, stroke 12 yrs ago, however no past cardiac history. He takes 20/25mg  lisinopril/hydrochlorothiazide combo and 20 mg simvastatin at home. Patient also have history of polysubstance abuse. He drinks about 2-3 beers per day on work days, and additional 6 24 ounce beers on weekends. He occasionally does cocaine, the last use was 2-3 weeks ago during a party. He smokes on a daily basis. His family history was significant for DM on his mother's side, however he does not know his biological father. Patient is obese and does not exercise regularly. He does however go to party frequently. 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 Several CT of brain in 2005, 2011 and 2012 did not reveal intracranial etiology, stroke or vascular abnormalities. He denies any recent exertional chest pain, lower extremity edema, orthopnea or paroxysmal nocturnal dyspnea.  He went to his friend's party on Independence Day  July 4 and had unknown amount of alcohol. He did not take his blood pressure medication that day. He was in his usual state of health and felt he was doing well until the morning after on 12/21/2014. He started noticing exertional shortness breath, fatigue, and intermittent left-sided substernal chest burning sensation around 10:00. The symptom seems to be related to exertion. He also endorsed dizziness, presyncope, nausea and diaphoresis. He tried to take his blood pressure medication and the simvastatin this morning without relief.   He eventually sought medical attention at [facility]. On arrival significant laboratory finding include creatinine of 1.45, negative troponin, white blood cell count 20.1, hemoglobin 17.9. Chest x-ray showed no acute process. CT showed atrial fibrillation with RVR, elevated J-point in anterior lead likely early report, mild ST elevation in lead 1 and aVL with ST depression in V5 through V6 while the heart rate was 130s. Cardiology has been consulted for newly diagnosed atrial fibrillation with RVR.   Home Medications  Prior to Admission medications   Medication Sig Start Date End Date Taking? Authorizing Provider  acetaminophen (TYLENOL) 325 MG tablet Take 650 mg by mouth every 6 (six) hours as needed for mild pain or headache.   Yes Historical Provider, MD  ibuprofen (ADVIL,MOTRIN) 200 MG tablet Take 200 mg by mouth every 6 (six) hours as needed for fever or moderate pain.   Yes Historical Provider,  MD  lisinopril-hydrochlorothiazide (PRINZIDE,ZESTORETIC) 20-25 MG per tablet Take 1 tablet by mouth daily.   Yes Historical Provider, MD  simvastatin (ZOCOR) 20 MG tablet Take 20 mg by mouth daily.   Yes Historical Provider, MD    Family History  Family History  Problem Relation Age of Onset  . Diabetes Mellitus II Mother     Social History  History   Social History  . Marital Status: Single    Spouse Name: N/A  . Number of Children: N/A  . Years of  Education: N/A   Occupational History  . Not on file.   Social History Main Topics  . Smoking status: Current Every Day Smoker  . Smokeless tobacco: Not on file  . Alcohol Use: 0.0 oz/week    0 Standard drinks or equivalent per week     Comment: 2-3 beers per night during work nights, 6 24oz beer per day on the weekends  . Drug Use: Yes    Special: Cocaine     Comment: occasional cocaine  . Sexual Activity: Not on file   Other Topics Concern  . Not on file   Social History Narrative  . No narrative on file     Review of Systems General:  No chills, fever, night sweats or weight changes.  Cardiovascular:  No edema, orthopnea, palpitations, paroxysmal nocturnal dyspnea. +chest pain, dyspnea on exertion Dermatological: No rash, lesions/masses Respiratory: No cough Urologic: No hematuria, dysuria Abdominal:   No vomiting, diarrhea, bright red blood per rectum, melena, or hematemesis +nausea Neurologic:  No visual changes, changes in mental status. +fatigue, weakness, dizziness this morning.  All other systems reviewed and are otherwise negative except as noted above.  Physical Exam  Blood pressure 135/76, pulse 81, temperature 97.9 F (36.6 C), temperature source Oral, resp. rate 23, SpO2 99 %.  General: Pleasant, NAD Psych: Normal affect. Neuro: Alert and oriented X 3. Moves all extremities spontaneously. HEENT: Normal  Neck: Supple without bruits or JVD. Lungs:  Resp regular and unlabored, CTA. Heart: irregular. no s3, s4, or murmurs. Abdomen: Soft, non-tender, non-distended, BS + x 4.  Extremities: No clubbing, cyanosis or edema. DP/PT/Radials 2+ and equal bilaterally.  Labs  Troponin [facility](Point of Care Test)  Recent Labs  12/21/14 1416  TROPIPOC 0.04   No results for input(s): CKTOTAL, CKMB, TROPONINI in the last 72 hours. Lab Results  Component Value Date   WBC 20.1* 12/21/2014   HGB 17.9* 12/21/2014   HCT 53.0* 12/21/2014   MCV 91.1 12/21/2014   PLT 338  12/21/2014     Recent Labs Lab 12/21/14 1412  NA 142  K 4.0  CL 106  CO2 21*  BUN 10  CREATININE 1.45*  CALCIUM 9.1  GLUCOSE 86   Lab Results  Component Value Date   CHOL * 06/26/2010    225        ATP III CLASSIFICATION:  <200     mg/dL   Desirable  [id number]  mg/dL   Borderline High  >=914>=240    mg/dL   High          HDL 44 06/26/2010   LDLCALC * 06/26/2010    159        Total Cholesterol/HDL:CHD Risk Coronary Heart Disease Risk Table                     Men   Women  1/2 Average Risk   3.4   3.3  Average Risk  5.0   4.4  2 X Average Risk   9.6   7.1  3 X Average Risk  23.4   11.0        Use the calculated Patient Ratio above and the CHD Risk Table to determine the patient's CHD Risk.        ATP III CLASSIFICATION (LDL):  <100     mg/dL   Optimal  161-096  mg/dL   Near or Above                    Optimal  130-159  mg/dL   Borderline  045-409  mg/dL   High  >811     mg/dL   Very High   TRIG 914 06/26/2010    Radiology/Studies  Dg Chest Port 1 View  12/21/2014   CLINICAL DATA:  49 year old male with chest pain since last night. Nausea and diaphoresis. Initial encounter.  EXAM: PORTABLE CHEST - 1 VIEW  COMPARISON:  07/07/2010.  FINDINGS: Portable AP semi upright view at 1337 hrs. Lordotic view. Stable cardiac and mediastinal contours allowing for positioning and portable technique. Allowing for portable technique, the lungs are clear. No pneumothorax.  IMPRESSION: No acute cardiopulmonary abnormality.   Electronically Signed   By: Odessa Fleming M.D.   On: 12/21/2014 13:44    ECG  A-fib with RVR with diffuse ST changes improved on repeat EKG after IV diltiazem  Echocardiogram 08/20/2005  Normal EF, LV wall thickness upper limits of normal, no significant valvular dx    ASSESSMENT AND PLAN  1. Newly diagnosed PAF with RVR  - obtain TSH and echo. Urine drug test  - CHA2DS2-VASC score  1-3 (HTN +/- h/o stroke), potentially start on NOAC  - continue diltiazem gtt,  transition to diltiazem vs BB tomorrow.   2. Chest pain with EKG changes during a-fib with RVR  - unclear if CP is related to a-fib  - trend trop for now, likely need ischemic workup given ST changes during a-fib. Need UDT to make sure he has not consumed cocaine which may cause similar ST changes  3. Polysubstance abuse: ETOH abuse, tobacco and occasional cocaine  - heavy party and drinking on 7/4 may have contributed further to afib onset  - CIWA protocol  4. ?dehydration: hydrate overnight, check BMET in AM  - Cr 1.45, it was normal 4 yrs ago, will try to hydrate to see if any improvement  5. HTN: previously on lisinopril/HCTZ at home, now on diltiazem gtt  6. Leukocytosis: unclear etiology, check Echo for pericardial effusion. Recheck in AM to see if going down  Signed, Azalee Course, PA-C 12/21/2014, 3:18 PM

## 2014-12-22 ENCOUNTER — Encounter (HOSPITAL_COMMUNITY): Payer: Self-pay | Admitting: Certified Registered Nurse Anesthetist

## 2014-12-22 ENCOUNTER — Observation Stay (HOSPITAL_COMMUNITY): Payer: 59

## 2014-12-22 ENCOUNTER — Ambulatory Visit (HOSPITAL_COMMUNITY): Payer: 59

## 2014-12-22 DIAGNOSIS — I2510 Atherosclerotic heart disease of native coronary artery without angina pectoris: Secondary | ICD-10-CM

## 2014-12-22 DIAGNOSIS — R7989 Other specified abnormal findings of blood chemistry: Secondary | ICD-10-CM

## 2014-12-22 DIAGNOSIS — R55 Syncope and collapse: Secondary | ICD-10-CM

## 2014-12-22 DIAGNOSIS — R0602 Shortness of breath: Secondary | ICD-10-CM | POA: Diagnosis not present

## 2014-12-22 DIAGNOSIS — R079 Chest pain, unspecified: Secondary | ICD-10-CM | POA: Diagnosis not present

## 2014-12-22 DIAGNOSIS — I10 Essential (primary) hypertension: Secondary | ICD-10-CM | POA: Diagnosis not present

## 2014-12-22 DIAGNOSIS — I4891 Unspecified atrial fibrillation: Secondary | ICD-10-CM | POA: Diagnosis not present

## 2014-12-22 DIAGNOSIS — R778 Other specified abnormalities of plasma proteins: Secondary | ICD-10-CM | POA: Insufficient documentation

## 2014-12-22 DIAGNOSIS — R001 Bradycardia, unspecified: Secondary | ICD-10-CM

## 2014-12-22 LAB — CBC
HEMATOCRIT: 50.7 % (ref 39.0–52.0)
Hemoglobin: 17.1 g/dL — ABNORMAL HIGH (ref 13.0–17.0)
MCH: 30.5 pg (ref 26.0–34.0)
MCHC: 33.7 g/dL (ref 30.0–36.0)
MCV: 90.4 fL (ref 78.0–100.0)
Platelets: 349 10*3/uL (ref 150–400)
RBC: 5.61 MIL/uL (ref 4.22–5.81)
RDW: 14 % (ref 11.5–15.5)
WBC: 16.7 10*3/uL — ABNORMAL HIGH (ref 4.0–10.5)

## 2014-12-22 LAB — TROPONIN I
TROPONIN I: 0.48 ng/mL — AB (ref <0.031)
Troponin I: 0.39 ng/mL — ABNORMAL HIGH (ref <0.031)

## 2014-12-22 LAB — BASIC METABOLIC PANEL
ANION GAP: 10 (ref 5–15)
BUN: 16 mg/dL (ref 6–20)
CALCIUM: 8.9 mg/dL (ref 8.9–10.3)
CHLORIDE: 103 mmol/L (ref 101–111)
CO2: 26 mmol/L (ref 22–32)
Creatinine, Ser: 1.13 mg/dL (ref 0.61–1.24)
GFR calc Af Amer: >60 mL/min (ref >60)
GFR calc non Af Amer: >60 mL/min (ref >60)
GLUCOSE: 143 mg/dL — AB (ref 65–99)
Potassium: 3.4 mmol/L — ABNORMAL LOW (ref 3.5–5.1)
Sodium: 139 mmol/L (ref 135–145)

## 2014-12-22 LAB — LIPID PANEL
CHOL/HDL RATIO: 3.6 ratio
CHOLESTEROL: 169 mg/dL (ref 0–200)
HDL: 47 mg/dL (ref >40)
LDL CALC: 90 mg/dL (ref 0–99)
Triglycerides: 158 mg/dL — ABNORMAL HIGH (ref <150)
VLDL: 32 mg/dL (ref 0–40)

## 2014-12-22 MED ORDER — FOLIC ACID 1 MG PO TABS
1.0000 mg | ORAL_TABLET | Freq: Every day | ORAL | Status: DC
  Administered 2014-12-22 – 2014-12-24 (×3): 1 mg via ORAL
  Filled 2014-12-22 (×3): qty 1

## 2014-12-22 MED ORDER — ADULT MULTIVITAMIN W/MINERALS CH
1.0000 | ORAL_TABLET | Freq: Every day | ORAL | Status: DC
  Administered 2014-12-22 – 2014-12-24 (×3): 1 via ORAL
  Filled 2014-12-22 (×3): qty 1

## 2014-12-22 MED ORDER — DILTIAZEM HCL ER COATED BEADS 180 MG PO CP24
180.0000 mg | ORAL_CAPSULE | Freq: Every day | ORAL | Status: DC
  Administered 2014-12-23 – 2014-12-24 (×2): 180 mg via ORAL
  Filled 2014-12-22 (×2): qty 1

## 2014-12-22 MED ORDER — SODIUM CHLORIDE 0.9 % IV SOLN: INTRAVENOUS | Status: DC

## 2014-12-22 MED ORDER — LISINOPRIL 20 MG PO TABS
20.0000 mg | ORAL_TABLET | Freq: Every day | ORAL | Status: DC
  Administered 2014-12-23 – 2014-12-24 (×2): 20 mg via ORAL
  Filled 2014-12-22 (×2): qty 1

## 2014-12-22 MED ORDER — DILTIAZEM HCL 30 MG PO TABS
30.0000 mg | ORAL_TABLET | Freq: Four times a day (QID) | ORAL | Status: AC
  Administered 2014-12-22 – 2014-12-23 (×4): 30 mg via ORAL
  Filled 2014-12-22 (×4): qty 1

## 2014-12-22 MED ORDER — LORAZEPAM 2 MG/ML IJ SOLN
1.0000 mg | Freq: Four times a day (QID) | INTRAMUSCULAR | Status: DC | PRN
  Administered 2014-12-24: 1 mg via INTRAVENOUS
  Filled 2014-12-22: qty 1

## 2014-12-22 MED ORDER — VITAMIN B-1 100 MG PO TABS
100.0000 mg | ORAL_TABLET | Freq: Every day | ORAL | Status: DC
  Administered 2014-12-22 – 2014-12-24 (×3): 100 mg via ORAL
  Filled 2014-12-22 (×3): qty 1

## 2014-12-22 MED ORDER — TECHNETIUM TC 99M SESTAMIBI GENERIC - CARDIOLITE
30.0000 | Freq: Once | INTRAVENOUS | Status: AC | PRN
  Administered 2014-12-22: 30 via INTRAVENOUS

## 2014-12-22 MED ORDER — THIAMINE HCL 100 MG/ML IJ SOLN
100.0000 mg | Freq: Every day | INTRAMUSCULAR | Status: DC
  Filled 2014-12-22: qty 2

## 2014-12-22 MED ORDER — LISINOPRIL 10 MG PO TABS
10.0000 mg | ORAL_TABLET | Freq: Once | ORAL | Status: AC
  Administered 2014-12-22: 10 mg via ORAL
  Filled 2014-12-22: qty 1

## 2014-12-22 MED ORDER — POTASSIUM CHLORIDE CRYS ER 20 MEQ PO TBCR
20.0000 meq | EXTENDED_RELEASE_TABLET | Freq: Once | ORAL | Status: AC
  Administered 2014-12-22: 20 meq via ORAL
  Filled 2014-12-22: qty 1

## 2014-12-22 MED ORDER — LORAZEPAM 1 MG PO TABS
1.0000 mg | ORAL_TABLET | Freq: Four times a day (QID) | ORAL | Status: DC | PRN
  Administered 2014-12-22: 1 mg via ORAL
  Filled 2014-12-22 (×2): qty 1

## 2014-12-22 NOTE — Progress Notes (Signed)
Patient Profile: Hilery Wintle is a 49 year old black male with a PMH of nonobstructive CAD, HTN, tobacco abuse, CVA with no focal deficits, and polysubstance abuse presenting with weakness, dyspnea, and mild chest pain.   Subjective: No acute complaints. Mild chest pain around 8 PM last night which lasted for about 10 minutes and spontaneously resolved. Otherwise felt well overnight.   Objective: Vital signs in last 24 hours: Temp:  [97.5 F (36.4 C)-98.6 F (37 C)] 97.9 F (36.6 C) (07/06 0715) Pulse Rate:  [56-135] 69 (07/06 0715) Resp:  [16-27] 19 (07/06 0715) BP: (117-180)/(69-107) 155/89 mmHg (07/06 0715) SpO2:  [97 %-100 %] 100 % (07/06 0715) Weight:  [146.829 kg (323 lb 11.2 oz)] 146.829 kg (323 lb 11.2 oz) (07/06 0426) Last BM Date: 12/20/14  Intake/Output from previous day: 07/05 0701 - 07/06 0700 In: 554.7 [I.V.:554.7] Out: -   Medications Current Facility-Administered Medications  Medication Dose Route Frequency Provider Last Rate Last Dose  . 0.9 %  sodium chloride infusion  250 mL Intravenous Continuous Chrystie Nose, MD 30 mL/hr at 12/21/14 2142 250 mL at 12/21/14 2142  . acetaminophen (TYLENOL) tablet 650 mg  650 mg Oral Q6H PRN Azalee Course, PA   650 mg at 12/22/14 0318  . apixaban (ELIQUIS) tablet 5 mg  5 mg Oral BID Azalee Course, PA   5 mg at 12/22/14 0800  . diltiazem (CARDIZEM) 100 mg in dextrose 5 % 100 mL (1 mg/mL) infusion  5-15 mg/hr Intravenous Continuous Roxy Horseman, PA-C 5 mL/hr at 12/22/14 0445 5 mg/hr at 12/22/14 0445  . ondansetron (ZOFRAN) injection 4 mg  4 mg Intravenous Q6H PRN Azalee Course, PA      . simvastatin (ZOCOR) tablet 20 mg  20 mg Oral q1800 Azalee Course, [place]      . sodium chloride 0.9 % injection 3 mL  3 mL Intravenous Q12H Chrystie Nose, MD   3 mL at 12/22/14 1000  . sodium chloride 0.9 % injection 3 mL  3 mL Intravenous PRN Chrystie Nose, MD   3 mL at 12/22/14 0800    PE: General appearance: alert, distracted and no distress Neck:  no adenopathy, no carotid bruit, no JVD, supple, symmetrical, trachea midline and thyroid not enlarged, symmetric, no tenderness/mass/nodules Lungs: clear to auscultation bilaterally Heart: regularly irregular rhythm and S1, S2 normal Abdomen: soft, non-tender; bowel sounds normal; no masses,  no organomegaly Extremities: extremities normal, atraumatic, no cyanosis or edema Pulses: 2+ and symmetric Skin: Skin color, texture, turgor normal. No rashes or lesions Neurologic: Grossly normal  Lab Results:   Recent Labs  12/21/14 1412 12/22/14 0500  WBC 20.1* 16.7*  HGB 17.9* 17.1*  HCT 53.0* 50.7  PLT 338 349   BMET  Recent Labs  12/21/14 1412 12/22/14 0500  NA 142 139  K 4.0 3.4*  CL 106 103  CO2 21* 26  GLUCOSE 86 143*  BUN 10 16  CREATININE 1.45* 1.13  CALCIUM 9.1 8.9   Cholesterol  Recent Labs  12/22/14 0500  CHOL 169   Cardiac Enzymes 7/5 @ 1935 - 0.37 7/5 @ 2313 - 0.48 7/6 @ 0500 - 0.39  TELE: NSR at rate of high 50s to 60s. Occasional PVCs.   Studies/Results: CXR 12/21/14: No acute cardiopulmonary abnormality.  Assessment/Plan Principal Problem:   Atrial fibrillation with RVR Active Problems:   Chest pain   Essential hypertension   Remote history of stroke   Polysubstance abuse   CAD (coronary artery  disease)   1. PAF with RVR: - No prior hx of AF.  Unknown duration.   - Now rate controlled on diltiazem gtt 5 mg/hr.  - Utox negative.  - ETOH negative, but evidence of alcoholic ketoacidosis on urinalysis.  - Appears hypovolemic on exam. HCT is hemoconcentrated and initial BMET showed AKI. - Start NS @ 100 mL/hr.  - CHA2DS2-VASc 3 (HTN, CVA). Eliquis 5 mg BID started yesterday. - Add Lopressor 25 mg BID. SBP 140-160. If HR < 60, can DC diltiazem gtt.    2. Chest pain with elevated troponin: - No active chest pain.  - Mild, flat troponin elevation in the setting of new a-fib and AKI.  - EKG showing a-fib with nonspecific repolarization  abnormalities. T-wave changes are not consistent between ECGs with varying HR.  - HEART score 6. Will need ischemic evaluation. ?Myoview prior to DC?     3. AKI: - Cr initially 1.45. Trending down today to 1.13.  - Likely related to hypovolemia secondary to binge drinking over the weekend.  - Starting NS @ 100 mL/hr.   4. Moderate ketoacidosis: - Urine ketones 40.  - Most likely related to binge drinking over the weekend, although possibly a hyperglycemic component.  - Check HgbA1c.  - Starting NS @ 100 mL/hr.   5. Essential HTN: - Adding Lopressor 25 mg BID.  - Was on lisinopril/HCTZ as an outpatient. Hold for now.   6. Polysubstance abuse: - Utox negative.  - Denies recent illegal drug use. Admits to binge drinking this past weekend.   Franne Fortsyler Crane, NP student   12/22/2014 8:26 AM   I have seen and examined the patient. Agreed with assessment and plan discussed by Franne Fortsyler Crane, NP student.  Correction made as necessary.  1. afib with RVR - TEE/DCCV cancelled today as patient had only two dose of Eliquis. Tele showed patient converted to NSR spontaneously at 8:50am 12/22/14. Will get 12 lead EKG.  - Discontinue dilt gtt and will start on Cardizem 30mg  q6 hours. If he tolerates this, will switch to long acting tomorrow.  - Continue Eliquis. At length discussion to avoid alcohol drinking.  - TSH, UDS and alcohol negative.   2. Chest pain - yesterday around 8pm pt had a 4/10 chest pressure and heaviness while laying in bed. Pain did not improved on 1 X SL nitro. Improved on oxygen. No episode since then. Trop trend 0.37-->0.49-->0.38. He will need ischemic eval. He as eaten today. Likely treadmill Myoview tomorrow vs outpatient. Md to decide.   3. AKI - Creatinine improved to 1.13 on hydration.   4. Hypokalemia - K of 3.4 today. Will give supplement.   5. Moderate ketoacidosis - Urine ketone of 40. Will get HgbA1C with hyperglycemia.   6. HTN - BP in 150s/80s. Added lopressor  25mg  BID on admission. Lisinopril/HCTZ on hold due to AKI, consider resuming.    Crystallynn Noorani, PA-C [facility]

## 2014-12-22 NOTE — Progress Notes (Signed)
HR 70s-80s, A-fib on telemetry.  Cardizem gtt weaned down to 5 mg/hr.  VSS.  NAD.  Denies pain.  Will continue to monitor.  Gary Buck, Gary Buck

## 2014-12-22 NOTE — Progress Notes (Signed)
Contacted by nursing re BP WIll Rx lisinopril 10 tonight IN AM switch dilt to 180 CD Add back lisinopril alone at 20.  Check BMET in AM

## 2014-12-22 NOTE — Progress Notes (Signed)
Pt c/o left CP 4/10, described as pressure and heaviness.  Lying in bed watching TV with NAD.  Denies nausea, diaphoresis, or SOB.  O2 applied via [place] @ 2L/min.  SL NTG given x 1 with no relief.  SBP dropped from 150s to 117 after NTG.  Pt reports relief from CP after being on O2 for 10 mins, and is requesting a [place] sandwich.  Troponin slightly elevated.  MD paged.  Will continue to monitor.  Alonza Bogusuvall, Cherish Runde Gray

## 2014-12-22 NOTE — Progress Notes (Signed)
  Echocardiogram 2D Echocardiogram has been performed.  Arvil ChacoFoster, Charnetta Wulff 12/22/2014, 1:49 PM

## 2014-12-22 NOTE — Interval H&P Note (Signed)
History and Physical Interval Note:  12/22/2014 7:50 AM  Gary Buck  has presented today for surgery, with the diagnosis of AFIB  The various methods of treatment have been discussed with the patient and family. After consideration of risks, benefits and other options for treatment, the patient has consented to  Procedure(s): CARDIOVERSION (N/A) as a surgical intervention .  The patient's history has been reviewed, patient examined, no change in status, stable for surgery.  I have reviewed the patient's chart and labs.  Questions were answered to the patient's satisfaction.     Charlton HawsPeter Bailie Christenbury

## 2014-12-22 NOTE — Progress Notes (Signed)
Patient ID: Gary Buck, male   DOB: 16-Sep-1965, 49 y.o.   MRN: [id number] Discussed with Dr Rennis GoldenHilty Not clear to me when afib started Only got his 2nd dose of Eliquis this am  Have scheduled TEE/DCC for 12/22/14 Ok to eat now And orders for TEE/DCC written  Charlton HawsPeter Nishan

## 2014-12-22 NOTE — Progress Notes (Signed)
Pt converted to NSR circa 9am. VSS. MD aware. Will continue to monitor

## 2014-12-23 ENCOUNTER — Encounter (HOSPITAL_COMMUNITY): Payer: 59

## 2014-12-23 ENCOUNTER — Encounter (HOSPITAL_COMMUNITY)
Admission: EM | Disposition: A | Discharge status: Home / Self Care | Payer: Self-pay | Source: Home / Self Care | Attending: Emergency Medicine

## 2014-12-23 DIAGNOSIS — R079 Chest pain, unspecified: Secondary | ICD-10-CM

## 2014-12-23 DIAGNOSIS — I4891 Unspecified atrial fibrillation: Secondary | ICD-10-CM | POA: Insufficient documentation

## 2014-12-23 LAB — BASIC METABOLIC PANEL WITH GFR
Anion gap: 7 (ref 5–15)
BUN: 10 mg/dL (ref 6–20)
CO2: 27 mmol/L (ref 22–32)
Calcium: 8.9 mg/dL (ref 8.9–10.3)
Chloride: 106 mmol/L (ref 101–111)
Creatinine, Ser: 0.67 mg/dL (ref 0.61–1.24)
GFR calc Af Amer: >60 mL/min
GFR calc non Af Amer: >60 mL/min
Glucose, Bld: 118 mg/dL — ABNORMAL HIGH (ref 65–99)
Potassium: 3.9 mmol/L (ref 3.5–5.1)
Sodium: 140 mmol/L (ref 135–145)

## 2014-12-23 LAB — NM MYOCAR MULTI W/SPECT W/WALL MOTION / EF
CHL CUP NUCLEAR SDS: 1
CHL CUP NUCLEAR SRS: 1
CHL CUP RESTING HR STRESS: 89 {beats}/min
CSEPPHR: 89 {beats}/min
LV sys vol: 116 mL
LVDIAVOL: 198 mL
RATE: 0.36
SSS: 2
TID: 1.06

## 2014-12-23 LAB — HEMOGLOBIN A1C
Hgb A1c MFr Bld: 6.2 % — ABNORMAL HIGH (ref 4.8–5.6)
MEAN PLASMA GLUCOSE: 131 mg/dL

## 2014-12-23 SURGERY — ECHOCARDIOGRAM, TRANSESOPHAGEAL
Anesthesia: Monitor Anesthesia Care

## 2014-12-23 MED ORDER — REGADENOSON 0.4 MG/5ML IV SOLN
INTRAVENOUS | Status: AC
  Administered 2014-12-23: 0.4 mg via INTRAVENOUS
  Filled 2014-12-23: qty 5

## 2014-12-23 MED ORDER — TECHNETIUM TC 99M SESTAMIBI GENERIC - CARDIOLITE
30.0000 | Freq: Once | INTRAVENOUS | Status: AC | PRN
  Administered 2014-12-23: 30 via INTRAVENOUS

## 2014-12-23 MED ORDER — NICOTINE 21 MG/24HR TD PT24
21.0000 mg | MEDICATED_PATCH | Freq: Every day | TRANSDERMAL | Status: DC
  Administered 2014-12-23: 21 mg via TRANSDERMAL
  Filled 2014-12-23: qty 1

## 2014-12-23 MED ORDER — ATORVASTATIN CALCIUM 10 MG PO TABS
10.0000 mg | ORAL_TABLET | Freq: Every day | ORAL | Status: DC
  Administered 2014-12-23: 10 mg via ORAL
  Filled 2014-12-23: qty 1

## 2014-12-23 MED ORDER — METOPROLOL TARTRATE 12.5 MG HALF TABLET
12.5000 mg | ORAL_TABLET | Freq: Two times a day (BID) | ORAL | Status: DC
  Administered 2014-12-23 – 2014-12-24 (×2): 12.5 mg via ORAL
  Filled 2014-12-23 (×3): qty 1

## 2014-12-23 MED ORDER — REGADENOSON 0.4 MG/5ML IV SOLN
0.4000 mg | Freq: Once | INTRAVENOUS | Status: AC
  Administered 2014-12-23: 0.4 mg via INTRAVENOUS

## 2014-12-23 MED ORDER — LISINOPRIL 20 MG PO TABS
20.0000 mg | ORAL_TABLET | Freq: Once | ORAL | Status: AC
  Administered 2014-12-23: 20 mg via ORAL
  Filled 2014-12-23: qty 1

## 2014-12-23 NOTE — Progress Notes (Signed)
BP 192/95, MD notified. pt asymtomatic. Will continue to monitor and await orders. Pt removed own IV circa 1745, IV team consulted

## 2014-12-23 NOTE — Progress Notes (Signed)
Patient Name: Gary Buck Date of Encounter: 12/23/2014  Principal Problem:   Atrial fibrillation with RVR Active Problems:   Chest pain   Essential hypertension   Remote history of stroke   Polysubstance abuse   CAD (coronary artery disease)   Elevated troponin  SUBJECTIVE  Feels better, slept well. Denies chest pain, sob or palpitation.   CURRENT MEDS . diltiazem  180 mg Oral Daily  . folic acid  1 mg Oral Daily  . lisinopril  20 mg Oral Daily  . multivitamin with minerals  1 tablet Oral Daily  . simvastatin  20 mg Oral q1800  . sodium chloride  3 mL Intravenous Q12H  . thiamine  100 mg Oral Daily   Or  . thiamine  100 mg Intravenous Daily    OBJECTIVE  Filed Vitals:   12/22/14 1112 12/22/14 2031 12/22/14 2357 12/23/14 0605  BP: 169/91 189/102 158/86 166/91  Pulse: 73 65 66 76  Temp: 98.2 F (36.8 C) 98.5 F (36.9 C) 98.3 F (36.8 C) 98.1 F (36.7 C)  TempSrc: Oral Oral Oral Oral  Resp: Height:      Weight:    327 lb 4.8 oz (148.462 kg)  SpO2: 100% 100% 98% 97%    Intake/Output Summary (Last 24 hours) at 12/23/14 0829 Last data filed at 12/23/14 0000  Gross per 24 hour  Intake   1220 ml  Output      3 ml  Net   1217 ml   Filed Weights   12/22/14 0426 12/23/14 0605  Weight: 323 lb 11.2 oz (146.829 kg) 327 lb 4.8 oz (148.462 kg)    PHYSICAL EXAM  General: Pleasant, NAD. Neuro: Alert and oriented X 3. Moves all extremities spontaneously. Psych: Normal affect. HEENT:  Normal  Neck: Supple without bruits or JVD. Lungs:  Resp regular and unlabored, CTA. Heart: RRR no s3, s4, or murmurs. Abdomen: Soft, non-tender, non-distended, BS + x 4.  Extremities: No clubbing, cyanosis or edema. DP/PT/Radials 2+ and equal bilaterally.  Accessory Clinical Findings  CBC  Recent Labs  12/21/14 1412 12/22/14 0500  WBC 20.1* 16.7*  NEUTROABS 16.4*  --   HGB 17.9* 17.1*  HCT 53.0* 50.7  MCV 91.1 90.4  PLT 338 349   Basic Metabolic  Panel  Recent Labs  16/10/96 1412 12/22/14 0500  NA 142 139  K 4.0 3.4*  CL 106 103  CO2 21* 26  GLUCOSE 86 143*  BUN 10 16  CREATININE 1.45* 1.13  CALCIUM 9.1 8.9   Cardiac Enzymes  Recent Labs  12/21/14 1935 12/21/14 2313 12/22/14 0500  TROPONINI 0.37* 0.48* 0.39*   Hemoglobin A1C  Recent Labs  12/22/14 0500  HGBA1C 6.2*   Fasting Lipid Panel  Recent Labs  12/22/14 0500  CHOL 169  HDL 47  LDLCALC 90  TRIG 158*  CHOLHDL 3.6   Thyroid Function Tests  Recent Labs  12/21/14 1935  TSH 0.530    TELE  NSR at rate of 60-80s.   Radiology/Studies  Dg Chest Port 1 View  12/21/2014   CLINICAL DATA:  49 year old male with chest pain since last night. Nausea and diaphoresis. Initial encounter.  EXAM: PORTABLE CHEST - 1 VIEW  COMPARISON:  07/07/2010.  FINDINGS: Portable AP semi upright view at 1337 hrs. Lordotic view. Stable cardiac and mediastinal contours allowing for positioning and portable technique. Allowing for portable technique, the lungs are clear. No pneumothorax.  IMPRESSION: No acute cardiopulmonary abnormality.   Electronically  Signed   By: Odessa FlemingH  Hall M.D.   On: 12/21/2014 13:44    Echo 12/22/14 LV EF: 60% -  65%  ------------------------------------------------------------------- Indications:   Atrial fibrillation - 427.31.  ------------------------------------------------------------------- History:  PMH:  Chest pain. Coronary artery disease. Stroke. Risk factors: Hypertension.  ------------------------------------------------------------------- Study Conclusions  - Left ventricle: The cavity size was normal. There was moderate concentric hypertrophy. Systolic function was normal. The estimated ejection fraction was in the range of 60% to 65%. Although no diagnostic regional wall motion abnormality was identified, this possibility cannot be completely excluded on the basis of this study. - Left atrium: The atrium was  moderately dilated.  Impressions:  - Compared to the prior study, there has been no significant interval change.  ASSESSMENT AND PLAN    1. Afib with RVR - Converted to NSR spontaneously at 8:50am yesterday 12/22/14, maintaining NSR.  - Started on Cardizem CD 180mg  today.    - TSH, UDS and alcohol negative.  - Echo 12/22/14 showed LV EF of 60-65%,moderate LVH.   2. Chest pain - No further episode. Trop trend 0.37-->0.49-->0.38. Day two of Myoview study. Eliquis on hold for possible cath if indicated. If his stress test is negative, would need to resume Eliquis on discharge.   3. AKI - Creatinine improved to 1.13 on hydration. Will get BEMT today.   4. Hypokalemia - K of 3.4 yesterday, supplement given. Pending Bmet today.   5. Moderate ketoacidosis - Urine ketone of 40. HgbA1C of 6.2. Encouraged lifestyle modification. F/u with PCP after discharge.   6. HTN - BP was 189/102 yesterday. Given lisinopril 10mg  last night and today schedule to lisinopril 20mg . Pending BMET. BP of 166/91 this AM. will continue to monitor.  Manson PasseyBhagat,Cylus Douville, PA-C Pager [phone]

## 2014-12-23 NOTE — Progress Notes (Signed)
The patient tolerated the Lexiscan well.  Alisyn Lequire, PAC 

## 2014-12-23 NOTE — Progress Notes (Signed)
Noted patient never started on BB as noted on 12/22/14 note. His BP is 174/103. Pulse of 60. Will add lopressor 12.5mg  BID including now. Plan to titrate further as pulse allows. Myoview low risk. May be able to discharge tomorrow.

## 2014-12-23 NOTE — Progress Notes (Signed)
No call back from MD about BP, paged PA, awaiting call back. Pt asymtomatic.

## 2014-12-23 NOTE — Progress Notes (Signed)
BP elevated 192/95.  The patient just pulled out his IV.  Will give another  lisinopril now.    Gary Buck, PAC

## 2014-12-24 DIAGNOSIS — I4891 Unspecified atrial fibrillation: Secondary | ICD-10-CM | POA: Diagnosis not present

## 2014-12-24 DIAGNOSIS — R079 Chest pain, unspecified: Secondary | ICD-10-CM | POA: Diagnosis not present

## 2014-12-24 DIAGNOSIS — R0602 Shortness of breath: Secondary | ICD-10-CM | POA: Diagnosis not present

## 2014-12-24 DIAGNOSIS — I10 Essential (primary) hypertension: Secondary | ICD-10-CM | POA: Diagnosis not present

## 2014-12-24 LAB — BASIC METABOLIC PANEL
Anion gap: 7 (ref 5–15)
BUN: 11 mg/dL (ref 6–20)
CO2: 27 mmol/L (ref 22–32)
CREATININE: 0.77 mg/dL (ref 0.61–1.24)
Calcium: 9 mg/dL (ref 8.9–10.3)
Chloride: 107 mmol/L (ref 101–111)
GFR calc Af Amer: >60 mL/min (ref >60)
GLUCOSE: 136 mg/dL — AB (ref 65–99)
POTASSIUM: 3.9 mmol/L (ref 3.5–5.1)
SODIUM: 141 mmol/L (ref 135–145)

## 2014-12-24 MED ORDER — APIXABAN 5 MG PO TABS: 5.0000 mg | ORAL_TABLET | Freq: Two times a day (BID) | ORAL | Status: DC

## 2014-12-24 MED ORDER — METOPROLOL TARTRATE 12.5 MG HALF TABLET
12.5000 mg | ORAL_TABLET | Freq: Once | ORAL | Status: AC
  Administered 2014-12-24: 12.5 mg via ORAL
  Filled 2014-12-24: qty 1

## 2014-12-24 MED ORDER — PANTOPRAZOLE SODIUM 40 MG PO TBEC: 40.0000 mg | DELAYED_RELEASE_TABLET | Freq: Every day | ORAL | Status: DC

## 2014-12-24 MED ORDER — METOPROLOL TARTRATE 25 MG PO TABS: 25.0000 mg | ORAL_TABLET | Freq: Two times a day (BID) | ORAL | Status: DC

## 2014-12-24 MED ORDER — DILTIAZEM HCL ER COATED BEADS 180 MG PO CP24: 180.0000 mg | ORAL_CAPSULE | Freq: Every day | ORAL | Status: DC

## 2014-12-24 MED ORDER — PANTOPRAZOLE SODIUM 40 MG PO TBEC
40.0000 mg | DELAYED_RELEASE_TABLET | Freq: Every day | ORAL | Status: DC
  Administered 2014-12-24: 40 mg via ORAL
  Filled 2014-12-24: qty 1

## 2014-12-24 NOTE — Hospital Discharge Follow-Up (Signed)
Received call from Tomi BambergerBrenda-Graves Bigelow, RN CM that hospital follow-up appointment needed for patient.  Patient uninsured and without a PCP.  Patient admitted for atrial fibrillation with RVR-discharging on Eliquis. Hospital follow-up appointment obtained on 01/04/15 at 0900 with Dr. Venetia NightAmao. Appointment placed on AVS.  Informed Tomi BambergerBrenda Graves-Bigelow, RN CM of appointment. She indicates patient will be given a card to get 30 days of Eliquis free. She indicates patient assistance form already started for medication. Spoke with Geraldine Contrasee, [facility] tech, who indicates [facility] and get medication as well as other medications filled.  Patient will then be given an appointment to meet [facility] Coord[facility].  Steward DroneBrenda updated. No other needs identified.

## 2014-12-24 NOTE — Care Management Note (Addendum)
Case Management Note  Patient Details  Name: Gary Buck MRN: [id number] Date of Birth: 1965-08-23  Subjective/Objective:     Pt admitted for Afib RVR. Plan for d/c home once medically stable. Pt had orange card and it expired. Pt is working at [employer]. He gets [facility] if needed for medications. Pt can be contacted @ [phone] sister Al DecantJuanita Gayle Gelardi #.      Action/Plan: CM did call the [facility] (Transitional Care Liaison) to see if can assist with Hospital F/u and PCP needs. Pt assistance form for Eliquis is on the front of chart to be completed by MD. RN aware to provide pt with the patient assistance form once completed. CM did discuss [facility] and [employer] for cheaper medications. MD please consider all generics ($4.00 Med list) if possible for d/c medications. CSW to provide pt with bus pass before d/c. Awaiting call back from the (Transitional Care Nurse). No further needs from CM at this time.   Expected Discharge Date:                  Expected Discharge Plan:  Home/Self Care  In-House Referral:     Discharge planning Services  CM Consult  Post Acute Care Choice:    Choice offered to:  NA  DME Arranged:  N/A DME Agency:  NA  HH Arranged:  NA HH Agency:  NA  Status of Service:  Completed, signed off  Medicare Important Message Given:    Date Medicare IM Given:    Medicare IM give by:    Date Additional Medicare IM Given:    Additional Medicare Important Message give by:     If discussed at Long Length of Stay Meetings, dates discussed:    Additional Comments:  1243 12-24-14 Tomi BambergerBrenda Graves-Bigelow, RN,BSN [phone] Transitional Care Nurse Peterson Lomb[place]Jessica Beck did call back in ref to: Hospital follow-up appointment obtained on 01/04/15 at 0900 with Dr. Venetia NightAmao. Pt will be [facility]. No further needs at this time.   Gala LewandowskyGraves-Bigelow, Avabella Wailes Kaye, RN 12/24/2014, 12:00 PM

## 2014-12-24 NOTE — Discharge Summary (Signed)
Discharge Summary   Patient ID: Gary Buck,  MRN: [id number], DOB/AGE: April 23, 1966 49 y.o.  Admit date: 12/21/2014 Discharge date: 12/24/2014  Primary Care Provider: No PCP Per Patient Primary Cardiologist: NEW (Dr. Rennis Golden)  Discharge Diagnoses Principal Problem:   Atrial fibrillation with RVR Active Problems:   Chest pain   Essential hypertension   Remote history of stroke   Polysubstance abuse   CAD (coronary artery disease)   Elevated troponin   Atrial fibrillation, unspecified   Allergies No Known Allergies  Procedures  Echo 12/22/14 LV EF: 60% -  65%  ------------------------------------------------------------------- Indications:   Atrial fibrillation - 427.31.  ------------------------------------------------------------------- History:  PMH:  Chest pain. Coronary artery disease. Stroke. Risk factors: Hypertension.  ------------------------------------------------------------------- Study Conclusions  - Left ventricle: The cavity size was normal. There was moderate concentric hypertrophy. Systolic function was normal. The estimated ejection fraction was in the range of 60% to 65%. Although no diagnostic regional wall motion abnormality was identified, this possibility cannot be completely excluded on the basis of this study. - Left atrium: The atrium was moderately dilated.  Impressions:  - Compared to the prior study, there has been no significant interval change.  Show images for NM Myocar Multi W/Spect W/Wall Motion / EF 12/22/14 - 12/23/14 - Two day study     Study Result      There was no ST segment deviation noted during stress.  No T wave inversion was noted during stress.  The study is normal.  This is a low risk study.  The left ventricular ejection fraction is moderately decreased (30-44%).  Low risk stress nuclear study with normal perfusion and mild to moderate reduction in left ventricular global systolic  function. 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 "Balanced ischemia" cannot be entirely excluded.     History of Present Illness  Gary Buck is obese 49 year old African-American male with past medical history of HTN, HLD, stroke 12 yrs ago, however no past cardiac history who presented to [facility] ED 12/21/14 for evaluation of SOB and chest pain.   In 2012, he had chest pain and troponin elevation in the setting of cocaine use, but LHC demonstrated only a 30% proximal LAD stenosis, otherwise no CAD.  He takes 20/25mg  lisinopril/hydrochlorothiazide combo and 20 mg simvastatin at home. Patient also have history of polysubstance abuse. He drinks about 2-3 beers per day on work days, and additional 6 24 ounce beers on weekends. He occasionally does cocaine, the last use was 2-3 weeks ago during a party. He smokes on a daily basis. His family history was significant for DM on his mother's side, however he does not know his biological father. Patient is obese and does not exercise regularly. He does however go to party frequently. 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 Several CT of brain in 2005, 2011 and 2012 did not reveal intracranial etiology, stroke or vascular abnormalities. He denies any recent exertional chest pain, lower extremity edema, orthopnea or paroxysmal nocturnal dyspnea.  He went to his friend's party on Independence Day July 4 and had unknown amount of alcohol. He did not take his blood pressure medication that day. He was in his usual state of health and felt he was doing well until the morning after on 12/21/2014. He started noticing exertional shortness breath, fatigue, and intermittent  left-sided substernal chest burning sensation around 10:00. The symptom seems to be related to exertion. He also endorsed  dizziness, presyncope, nausea and diaphoresis. He tried to take his blood pressure medication and the simvastatin this morning without relief.   He eventually sought medical attention at [facility] 12/21/14. On arrival significant laboratory finding include creatinine of 1.45, negative troponin, white blood cell count 20.1, hemoglobin 17.9. Chest x-ray showed no acute process. EKG showed atrial fibrillation with RVR, elevated J-point in anterior lead likely early report, mild ST elevation in lead 1 and aVL with ST depression in V5 through V6 while the heart rate was 130s. Cardiology has been consulted for newly diagnosed atrial fibrillation with RVR.  Hospital Course  He was started on eliquis for anticoagulation, dilt gtt for rate controlled and hydration for AKI. CHA2DS2-VASC score 1-3 (HTN +/- h/o stroke). He kept NPO for possible TEE/ cardioversion in morning. Over night patient had a 4/10 chest pain. SL NTG given x 1 with no relief. SBP dropped from 150s to 117 after NTG. Pt reports relief from CP after being on O2 for 10 mins.  12/22/14 - TEE/DCCV was got cancelled as patient only had 2 dose of eliquis. Patient converted to NSR spontaneously. Discontinued dilt gtt, started Cardizem 30mg  q6 hours. TSH, UDS and alcohol negative. No further chest pain. Troponin trend was 0.37-->0.49-->0.38.  Held Eliquis, given the short duration of a-fib to allow for possible cath in a couple of days. Started on 2 day Myoview. In evening his BP went up and giving lisinopril 10mg  and plan to resume home dose next day. Echo 12/22/14 showed LV EF of 60-65%, moderate LVH.  12/23/14 - Pt started on lisinopril 20mg  daily. Switched to Cardizem CD 180mg , maintaining NSR. Two day Myoview was low risk, EF of 30-44%. His weight is 238lb. His EF was 60-65% on echo. Supplement given for low potassium.  No further chest pain. Creatinine improved on IV hydration. Given lopressor 12.5mg  in evening and plan was to titrate further.  He was given extra dose of lisinopril 20mg  for BP of 192/95 later.   12/24/14- Patient had L sided chest pain, similar to initial presentation. Described as dull, like a "indigestion". He was started on Protonix. His Eliquis was resumed. His hypokalemia and acute kidney injury was resolved. This metoprolol increased to 25mg  BID.   He was discharged stably. Given script for 1 month supply of Eliquis. Advice to resume home Zocor and lisinopril/HCTZ, can be switch to generics if needed.  Plan to f/u with Cardiology APP in 4 week and then with Dr. Rennis Golden. [facility].   Discharge Vitals Blood pressure 162/76, pulse 65, temperature 97.7 F (36.5 C), temperature source Oral, resp. rate 18, height 6' 5.5" (1.969 m), weight 328 lb 1.6 oz (148.825 kg), SpO2 100 %.  Filed Weights   12/22/14 0426 12/23/14 0605 12/24/14 0511  Weight: 323 lb 11.2 oz (146.829 kg) 327 lb 4.8 oz (148.462 kg) 328 lb 1.6 oz (148.825 kg)    CBC  Recent Labs  12/21/14 1412 12/22/14 0500  WBC 20.1* 16.7*  NEUTROABS 16.4*  --   HGB 17.9* 17.1*  HCT 53.0* 50.7  MCV 91.1 90.4  PLT 338 349   Basic Metabolic Panel  Recent Labs  12/23/14 0950 12/24/14 0339  NA 140 141  K 3.9 3.9  CL 106 107  CO2 27 27  GLUCOSE 118* 136*  BUN 10 11  CREATININE 0.67 0.77  CALCIUM 8.9 9.0  Cardiac Enzymes  Recent Labs  12/21/14 1935 12/21/14 2313 12/22/14 0500  TROPONINI 0.37* 0.48* 0.39*   Hemoglobin A1C  Recent Labs  12/22/14 0500  HGBA1C 6.2*   Fasting Lipid Panel  Recent Labs  12/22/14 0500  CHOL 169  HDL 47  LDLCALC 90  TRIG 158*  CHOLHDL 3.6   Thyroid Function Tests  Recent Labs  12/21/14 1935  TSH 0.530    Disposition  Pt is being discharged home today in good condition.  Follow-up Plans & Appointments  Follow-up Information    Follow up with HAGER, BRYAN, PA-C On 01/31/2015.   Specialties:  Physician Assistant, Radiology, Interventional Cardiology    Why:  @9 :30 post hospital   Contact information:   [address] [place] [address] [phone]       [facility]     On 01/04/2015.   Why:  Appointment on 01/04/15 at 9:00 am with Dr. Venetia NightAmao.   Contact information:   [address] [place] [id number] [phone]      Follow up with PCP.   Contact information:   HgbA1C of 6.2 and hyperglycemia. needs significant lifestyle modification.          Discharge Instructions    Diet - low sodium heart healthy    Complete by:  As directed      Discharge instructions    Complete by:  As directed   Patients taking eliquis should generally stay away from medicines like ibuprofen, Advil, Motrin, naproxen, and Aleve due to risk of stomach bleeding. You may take Tylenol as directed or talk to your primary doctor about alternatives.  - Avoid drinking alcohol while taking eqliuis for bleeding risk.     Increase activity slowly    Complete by:  As directed          F/u Labs/Studies: None  Discharge Medications    Medication List    STOP taking these medications        ibuprofen 200 MG tablet  Commonly known as:  ADVIL,MOTRIN      TAKE these medications        acetaminophen 325 MG tablet  Commonly known as:  TYLENOL  Take 650 mg by mouth every 6 (six) hours as needed for mild pain or headache.     apixaban 5 MG Tabs tablet  Commonly known as:  ELIQUIS  Take 1 tablet (5 mg total) by mouth 2 (two) times daily.     diltiazem 180 MG 24 hr capsule  Commonly known as:  CARDIZEM CD  Take 1 capsule (180 mg total) by mouth daily.     lisinopril-hydrochlorothiazide 20-25 MG per tablet  Commonly known as:  PRINZIDE,ZESTORETIC  Take 1 tablet by mouth daily.     metoprolol tartrate 25 MG tablet  Commonly known as:  LOPRESSOR  Take 1 tablet (25 mg total) by mouth 2 (two) times daily.     pantoprazole 40 MG tablet  Commonly known as:  PROTONIX  Take 1  tablet (40 mg total) by mouth daily.     simvastatin 20 MG tablet  Commonly known as:  ZOCOR  Take 20 mg by mouth daily.        Duration of Discharge Encounter   Greater than 30 minutes including physician time.  Signed, Hady Niemczyk PA-C 12/24/2014, 1:22 PM

## 2014-12-24 NOTE — Progress Notes (Signed)
Patient Name: Gary Buck Date of Encounter: 12/24/2014  Principal Problem:   Atrial fibrillation with RVR Active Problems:   Chest pain   Essential hypertension   Remote history of stroke   Polysubstance abuse   CAD (coronary artery disease)   Elevated troponin  SUBJECTIVE  Patient had L sided chest pain, similar to initial presentation. Describes the pain as dull, like a "indigestion". Currently 2/10 chest pain. Stable SOB. Denies palpitation.   CURRENT MEDS . atorvastatin  10 mg Oral q1800  . diltiazem  180 mg Oral Daily  . folic acid  1 mg Oral Daily  . lisinopril  20 mg Oral Daily  . metoprolol tartrate  12.5 mg Oral BID  . multivitamin with minerals  1 tablet Oral Daily  . nicotine  21 mg Transdermal QHS  . sodium chloride  3 mL Intravenous Q12H  . thiamine  100 mg Oral Daily   Or  . thiamine  100 mg Intravenous Daily    OBJECTIVE  Filed Vitals:   12/24/14 0444 12/24/14 0511 12/24/14 0512 12/24/14 0535  BP: 178/95 174/93 174/93 159/97  Pulse:  65    Temp:  97.7 F (36.5 C)    TempSrc:  Oral    Resp:      Height:      Weight:  328 lb 1.6 oz (148.825 kg)    SpO2:  100%  98%    Intake/Output Summary (Last 24 hours) at 12/24/14 0838 Last data filed at 12/24/14 0029  Gross per 24 hour  Intake    720 ml  Output      0 ml  Net    720 ml   Filed Weights   12/22/14 0426 12/23/14 0605 12/24/14 0511  Weight: 323 lb 11.2 oz (146.829 kg) 327 lb 4.8 oz (148.462 kg) 328 lb 1.6 oz (148.825 kg)    PHYSICAL EXAM  General: Pleasant, NAD. Nasal cannula in place.  Neuro: Alert and oriented X 3. Moves all extremities spontaneously. Psych: Normal affect. HEENT:  Normal  Neck: Supple without bruits or JVD. Lungs:  Resp regular and unlabored, CTA. Heart: RRR no s3, s4, or murmurs. Abdomen: Soft, non-tender, non-distended, BS + x 4.  Extremities: No clubbing, cyanosis or edema. DP/PT/Radials 2+ and equal bilaterally.  Accessory Clinical  Findings  CBC  Recent Labs  12/21/14 1412 12/22/14 0500  WBC 20.1* 16.7*  NEUTROABS 16.4*  --   HGB 17.9* 17.1*  HCT 53.0* 50.7  MCV 91.1 90.4  PLT 338 349   Basic Metabolic Panel  Recent Labs  12/23/14 0950 12/24/14 0339  NA 140 141  K 3.9 3.9  CL 106 107  CO2 27 27  GLUCOSE 118* 136*  BUN 10 11  CREATININE 0.67 0.77  CALCIUM 8.9 9.0   Cardiac Enzymes  Recent Labs  12/21/14 1935 12/21/14 2313 12/22/14 0500  TROPONINI 0.37* 0.48* 0.39*   Hemoglobin A1C  Recent Labs  12/22/14 0500  HGBA1C 6.2*   Fasting Lipid Panel  Recent Labs  12/22/14 0500  CHOL 169  HDL 47  LDLCALC 90  TRIG 158*  CHOLHDL 3.6   Thyroid Function Tests  Recent Labs  12/21/14 1935  TSH 0.530    TELE  NSR at rate of 60-80s.   Radiology/Studies  Nm Myocar Multi W/spect W/wall Motion / Ef  12/23/2014    There was no ST segment deviation noted during stress.  No T wave inversion was noted during stress.  The study is normal.  This is  a low risk study.  The left ventricular ejection fraction is moderately decreased (30-44%).   Low risk stress nuclear study with normal perfusion and mild to moderate  reduction in left ventricular global systolic function. Findings appear  most consistent with nonischemic cardiomyopathy. "Balanced ischemia"  cannot be entirely excluded.    Dg Chest Port 1 View  12/21/2014   CLINICAL DATA:  49 year old male with chest pain since last night. Nausea and diaphoresis. Initial encounter.  EXAM: PORTABLE CHEST - 1 VIEW  COMPARISON:  07/07/2010.  FINDINGS: Portable AP semi upright view at 1337 hrs. Lordotic view. Stable cardiac and mediastinal contours allowing for positioning and portable technique. Allowing for portable technique, the lungs are clear. No pneumothorax.  IMPRESSION: No acute cardiopulmonary abnormality.   Electronically Signed   By: Odessa FlemingH  Hall M.D.   On: 12/21/2014 13:44    Echo 12/22/14 LV EF: 60% -   65%  ------------------------------------------------------------------- Indications:   Atrial fibrillation - 427.31.  ------------------------------------------------------------------- History:  PMH:  Chest pain. Coronary artery disease. Stroke. Risk factors: Hypertension.  ------------------------------------------------------------------- Study Conclusions  - Left ventricle: The cavity size was normal. There was moderate concentric hypertrophy. Systolic function was normal. The estimated ejection fraction was in the range of 60% to 65%. Although no diagnostic regional wall motion abnormality was identified, this possibility cannot be completely excluded on the basis of this study. - Left atrium: The atrium was moderately dilated.  Impressions:  - Compared to the prior study, there has been no significant interval change.  ASSESSMENT AND PLAN    1. Afib with RVR - Converted to NSR spontaneously at 8:50am7/6/16, maintaining NSR.  - Continue Cardizem CD 180mg  today.     - Echo 12/22/14 showed LV EF of 60-65%,moderate LVH.  - Eliquis on hold, plan to resume on discharge.   2. Chest pain - No further episode. Trop trend 0.37-->0.49-->0.39.  - Two day Myoview was low risk, EF of 30-44%. His weight is 238lb. His EF was 60-65% on echo. Eliquis on hold for possible cath if indicated. Plan resume Eliquis on discharge.  Repeat EKG this AM without acute abnormality. Further management per MD.  - He had left sided chest pain this AM, describes it as "indigestion". Will start Protonix.   3. AKI - Resolved.    4. Hypokalemia - Resolved  5. Moderate ketoacidosis - Urine ketone of 40. HgbA1C of 6.2. Encouraged lifestyle modification. F/u with PCP after discharge.   6. HTN - still elevated. 159/97. Given extra dose of lisinopril 20mg  last evening. Started on metoprolol 12.5mg  BID (see yesterday's progress note). Will give additional metoprolol 12.5 mg now. Pulse  high 50s to 70s. Further management per MD. Likely need metoprolol 25mg  BID.   Manson PasseyBhagat,Zamya Culhane, PA-C Pager [phone]

## 2014-12-27 ENCOUNTER — Telehealth: Payer: Self-pay | Admitting: Internal Medicine

## 2014-12-27 NOTE — Telephone Encounter (Signed)
Needs a D/ C phone call .Marland Kitchen. Appt on 01/31/15 with Wilburt FinlayBryan Hager.   Thanks

## 2014-12-27 NOTE — Telephone Encounter (Signed)
Pt. Has been called and is doing ok and his medications are good , Wilburt FinlayBryan Hager PA will see him on 01/31/15 at 9:30

## 2015-01-04 ENCOUNTER — Inpatient Hospital Stay: Payer: Self-pay | Admitting: Family Medicine

## 2015-01-26 ENCOUNTER — Telehealth: Payer: Self-pay | Admitting: Physician Assistant

## 2015-01-27 NOTE — Telephone Encounter (Signed)
Close encounter 

## 2015-01-31 ENCOUNTER — Ambulatory Visit: Payer: 59 | Admitting: Physician Assistant

## 2015-01-31 ENCOUNTER — Ambulatory Visit: Payer: Self-pay | Admitting: Physician Assistant

## 2015-05-20 DIAGNOSIS — I10 Essential (primary) hypertension: Secondary | ICD-10-CM

## 2015-05-24 DIAGNOSIS — I10 Essential (primary) hypertension: Secondary | ICD-10-CM

## 2015-05-30 NOTE — Congregational Nurse Program (Signed)
Congregational Nurse Program Note  Date of Encounter: 05/24/2015  Past Medical History: Past Medical History  Diagnosis Date  . Hypertension   . ETOH abuse   . Tobacco abuse   . H/O cocaine abuse   . Hypercholesterolemia   . Anginal pain   . Type II diabetes mellitus dx'd 12/21/2014  . PAF (paroxysmal atrial fibrillation)     Gary Buck/notes 12/21/2014  . Stroke 2004    pt sts about 12 years ago.   . Depression   . Bipolar disorder     Encounter Details:     CNP Questionnaire - 05/24/15 1543    Patient Demographics   Is this a new or existing patient? Existing   Patient is considered a/an Not Applicable   Patient Assistance   Patient's financial/insurance status Uninsured;Low Income   Patient referred to apply for the following financial assistance [employer]   Food insecurities addressed Provided food supplies   Transportation assistance No   Assistance securing medications No   Educational health offerings Not Applicable   Encounter Details   Primary purpose of visit Chronic Illness/Condition Visit   Was an Emergency Department visit averted? Not Applicable   Does patient have a medical provider? Yes   Patient referred to Not Applicable   Was a mental health screening completed? (GAINS tool) No   Does patient have dental issues? No   Since previous encounter, have you referred patient for abnormal blood pressure that resulted in a new diagnosis or medication change? No   Since previous encounter, have you referred patient for abnormal blood glucose that resulted in a new diagnosis or medication change? No   For Abstraction Use Only   Does patient have insurance? No     Clinic visit for follow-up with B/P.  Was able to obtain medications.  Instructed to RTC in one week to monitor B/P

## 2015-05-30 NOTE — Congregational Nurse Program (Signed)
Congregational Nurse Program Note  Date of Encounter: 05/20/2015  Past Medical History: Past Medical History  Diagnosis Date  . Hypertension   . ETOH abuse   . Tobacco abuse   . H/O cocaine abuse   . Hypercholesterolemia   . Anginal pain   . Type II diabetes mellitus dx'd 12/21/2014  . PAF (paroxysmal atrial fibrillation)     [name]/notes 12/21/2014  . Stroke 2004    pt sts about 12 years ago.   . Depression   . Bipolar disorder     Encounter Details:     CNP Questionnaire - 05/19/15 1534    Patient Demographics   Is this a new or existing patient? Existing   Patient is considered a/an Not Applicable   Patient Assistance   Patient's financial/insurance status Uninsured;Low Income   Patient referred to apply for the following financial assistance [employer]   Food insecurities addressed Provided food supplies   Transportation assistance No   Assistance securing medications No   Educational health offerings Not Applicable   Encounter Details   Primary purpose of visit Chronic Illness/Condition Visit   Was an Emergency Department visit averted? Yes   Does patient have a medical provider? Yes   Patient referred to Clinic   Was a mental health screening completed? (GAINS tool) No   Does patient have dental issues? No   Since previous encounter, have you referred patient for abnormal blood pressure that resulted in a new diagnosis or medication change? No   Since previous encounter, have you referred patient for abnormal blood glucose that resulted in a new diagnosis or medication change? No   For Abstraction Use Only   Does patient have insurance? No     clinic visit for B/P check.  States has not been taking B/P medication.  Is out of meds.  States will pick up today.  Discussed with him the importance of getting meds today.  Discussed S/S of stroke.  Provided bus passes to pick up meds

## 2015-06-16 ENCOUNTER — Inpatient Hospital Stay: Payer: 59 | Admitting: Family Medicine

## 2015-06-19 DIAGNOSIS — I4891 Unspecified atrial fibrillation: Secondary | ICD-10-CM

## 2015-06-19 HISTORY — DX: Unspecified atrial fibrillation: I48.91

## 2015-08-02 ENCOUNTER — Emergency Department (HOSPITAL_COMMUNITY): Payer: 59

## 2015-08-02 ENCOUNTER — Emergency Department (HOSPITAL_COMMUNITY)
Admission: EM | Admit: 2015-08-02 | Discharge: 2015-08-02 | Disposition: A | Discharge status: Home / Self Care | Payer: 59 | Attending: Emergency Medicine | Admitting: Emergency Medicine

## 2015-08-02 ENCOUNTER — Encounter (HOSPITAL_COMMUNITY): Payer: Self-pay

## 2015-08-02 DIAGNOSIS — R531 Weakness: Secondary | ICD-10-CM | POA: Insufficient documentation

## 2015-08-02 DIAGNOSIS — I10 Essential (primary) hypertension: Secondary | ICD-10-CM | POA: Insufficient documentation

## 2015-08-02 DIAGNOSIS — R079 Chest pain, unspecified: Secondary | ICD-10-CM | POA: Insufficient documentation

## 2015-08-02 DIAGNOSIS — E119 Type 2 diabetes mellitus without complications: Secondary | ICD-10-CM | POA: Insufficient documentation

## 2015-08-02 DIAGNOSIS — F17210 Nicotine dependence, cigarettes, uncomplicated: Secondary | ICD-10-CM | POA: Insufficient documentation

## 2015-08-02 DIAGNOSIS — I480 Paroxysmal atrial fibrillation: Secondary | ICD-10-CM | POA: Insufficient documentation

## 2015-08-02 DIAGNOSIS — I209 Angina pectoris, unspecified: Secondary | ICD-10-CM | POA: Insufficient documentation

## 2015-08-02 LAB — BASIC METABOLIC PANEL WITH GFR
Anion gap: 11 (ref 5–15)
BUN: 12 mg/dL (ref 6–20)
CO2: 29 mmol/L (ref 22–32)
Calcium: 9.5 mg/dL (ref 8.9–10.3)
Chloride: 102 mmol/L (ref 101–111)
Creatinine, Ser: 0.84 mg/dL (ref 0.61–1.24)
GFR calc Af Amer: >60 mL/min
GFR calc non Af Amer: >60 mL/min
Glucose, Bld: 126 mg/dL — ABNORMAL HIGH (ref 65–99)
Potassium: 3.4 mmol/L — ABNORMAL LOW (ref 3.5–5.1)
Sodium: 142 mmol/L (ref 135–145)

## 2015-08-02 LAB — CBC
HCT: 49.5 % (ref 39.0–52.0)
Hemoglobin: 16.7 g/dL (ref 13.0–17.0)
MCH: 30.5 pg (ref 26.0–34.0)
MCHC: 33.7 g/dL (ref 30.0–36.0)
MCV: 90.3 fL (ref 78.0–100.0)
PLATELETS: 228 10*3/uL (ref 150–400)
RBC: 5.48 MIL/uL (ref 4.22–5.81)
RDW: 13.5 % (ref 11.5–15.5)
WBC: 13.7 10*3/uL — ABNORMAL HIGH (ref 4.0–10.5)

## 2015-08-02 LAB — PROTIME-INR
INR: 1.11 (ref 0.00–1.49)
Prothrombin Time: 14.5 s (ref 11.6–15.2)

## 2015-08-02 LAB — I-STAT TROPONIN, ED: Troponin i, poc: 0 ng/mL (ref 0.00–0.08)

## 2015-08-02 NOTE — ED Notes (Signed)
Called X4 and no answer. Pt not in x-ray, called to verify.

## 2015-08-02 NOTE — ED Notes (Signed)
Patient here with weakness and chst tightness that started after climbing steps at work, now no pain but feels tired

## 2015-08-02 NOTE — ED Notes (Signed)
Called x 2 no answer

## 2015-08-04 DIAGNOSIS — I10 Essential (primary) hypertension: Secondary | ICD-10-CM

## 2015-08-10 NOTE — Congregational Nurse Program (Signed)
Congregational Nurse Program Note  Date of Encounter: 08/04/2015  Past Medical History: Past Medical History  Diagnosis Date  . Hypertension   . ETOH abuse   . Tobacco abuse   . H/O cocaine abuse   . Hypercholesterolemia   . Anginal pain (HCC)   . Type II diabetes mellitus (HCC) dx'd 12/21/2014  . PAF (paroxysmal atrial fibrillation) (HCC)     [name] 12/21/2014  . Stroke [facility]) 2004    pt sts about 12 years ago.   . Depression   . Bipolar disorder [facility])     Encounter Details:     CNP Questionnaire - 08/04/15 1931    Patient Demographics   Is this a new or existing patient? Existing   Patient is considered a/an Not Applicable   Patient Assistance   Location of Patient Assistance Not Applicable   Patient's financial/insurance status Low Income;Self-Pay   Uninsured Patient Yes   Interventions Counseled to make appt. with provider   Patient referred to apply for the following financial assistance [employer] insecurities addressed Provided food supplies   Transportation assistance No   Assistance securing medications No   Educational health offerings Not Applicable   Encounter Details   Primary purpose of visit Chronic Illness/Condition Visit   Was an Emergency Department visit averted? Not Applicable   Does patient have a medical provider? Yes   Patient referred to Not Applicable   Was a mental health screening completed? (GAINS tool) No   Does patient have dental issues? No   Does patient have vision issues? No   Since previous encounter, have you referred patient for abnormal blood pressure that resulted in a new diagnosis or medication change? No   Since previous encounter, have you referred patient for abnormal blood glucose that resulted in a new diagnosis or medication change? No   For Abstraction Use Only   Does patient have insurance? No     B/P check.  Missed appointment with Lavinia Sharps NP at the [facility].  Encouraged to reschedule

## 2016-06-28 ENCOUNTER — Encounter (HOSPITAL_COMMUNITY): Payer: Self-pay | Admitting: Emergency Medicine

## 2016-06-28 ENCOUNTER — Inpatient Hospital Stay (HOSPITAL_COMMUNITY)
Admission: AD | Admit: 2016-06-28 | Discharge: 2016-07-03 | DRG: 885 | Disposition: A | Discharge status: Home / Self Care | Payer: Federal, State, Local not specified - Other | Attending: Psychiatry | Admitting: Psychiatry

## 2016-06-28 ENCOUNTER — Encounter (HOSPITAL_COMMUNITY): Payer: Self-pay | Admitting: *Deleted

## 2016-06-28 ENCOUNTER — Emergency Department (HOSPITAL_COMMUNITY)
Admission: EM | Admit: 2016-06-28 | Discharge: 2016-06-28 | Disposition: A | Discharge status: Other Acute Inpatient Hospital | Payer: 59 | Attending: Emergency Medicine | Admitting: Emergency Medicine

## 2016-06-28 DIAGNOSIS — I10 Essential (primary) hypertension: Secondary | ICD-10-CM | POA: Insufficient documentation

## 2016-06-28 DIAGNOSIS — G4700 Insomnia, unspecified: Secondary | ICD-10-CM | POA: Diagnosis present

## 2016-06-28 DIAGNOSIS — F17210 Nicotine dependence, cigarettes, uncomplicated: Secondary | ICD-10-CM | POA: Insufficient documentation

## 2016-06-28 DIAGNOSIS — F419 Anxiety disorder, unspecified: Secondary | ICD-10-CM | POA: Diagnosis present

## 2016-06-28 DIAGNOSIS — Z833 Family history of diabetes mellitus: Secondary | ICD-10-CM

## 2016-06-28 DIAGNOSIS — F1910 Other psychoactive substance abuse, uncomplicated: Secondary | ICD-10-CM | POA: Diagnosis present

## 2016-06-28 DIAGNOSIS — F3160 Bipolar disorder, current episode mixed, unspecified: Secondary | ICD-10-CM | POA: Diagnosis present

## 2016-06-28 DIAGNOSIS — Z79899 Other long term (current) drug therapy: Secondary | ICD-10-CM

## 2016-06-28 DIAGNOSIS — F1010 Alcohol abuse, uncomplicated: Secondary | ICD-10-CM | POA: Diagnosis not present

## 2016-06-28 DIAGNOSIS — F3163 Bipolar disorder, current episode mixed, severe, without psychotic features: Secondary | ICD-10-CM | POA: Diagnosis present

## 2016-06-28 DIAGNOSIS — E119 Type 2 diabetes mellitus without complications: Secondary | ICD-10-CM | POA: Insufficient documentation

## 2016-06-28 DIAGNOSIS — R45850 Homicidal ideations: Secondary | ICD-10-CM | POA: Diagnosis present

## 2016-06-28 DIAGNOSIS — Z7901 Long term (current) use of anticoagulants: Secondary | ICD-10-CM | POA: Diagnosis not present

## 2016-06-28 DIAGNOSIS — Z8673 Personal history of transient ischemic attack (TIA), and cerebral infarction without residual deficits: Secondary | ICD-10-CM | POA: Insufficient documentation

## 2016-06-28 DIAGNOSIS — I2510 Atherosclerotic heart disease of native coronary artery without angina pectoris: Secondary | ICD-10-CM | POA: Diagnosis present

## 2016-06-28 DIAGNOSIS — Z9889 Other specified postprocedural states: Secondary | ICD-10-CM | POA: Diagnosis not present

## 2016-06-28 DIAGNOSIS — Z7982 Long term (current) use of aspirin: Secondary | ICD-10-CM | POA: Insufficient documentation

## 2016-06-28 DIAGNOSIS — Z590 Homelessness: Secondary | ICD-10-CM | POA: Diagnosis not present

## 2016-06-28 LAB — CBC
HEMATOCRIT: 46.8 % (ref 39.0–52.0)
Hemoglobin: 15.7 g/dL (ref 13.0–17.0)
MCH: 31 pg (ref 26.0–34.0)
MCHC: 33.5 g/dL (ref 30.0–36.0)
MCV: 92.3 fL (ref 78.0–100.0)
Platelets: 256 10*3/uL (ref 150–400)
RBC: 5.07 MIL/uL (ref 4.22–5.81)
RDW: 14 % (ref 11.5–15.5)
WBC: 12 10*3/uL — AB (ref 4.0–10.5)

## 2016-06-28 LAB — COMPREHENSIVE METABOLIC PANEL
ALT: 14 U/L — ABNORMAL LOW (ref 17–63)
ANION GAP: 7 (ref 5–15)
AST: 19 U/L (ref 15–41)
Albumin: 3.8 g/dL (ref 3.5–5.0)
Alkaline Phosphatase: 90 U/L (ref 38–126)
BUN: 13 mg/dL (ref 6–20)
CHLORIDE: 104 mmol/L (ref 101–111)
CO2: 26 mmol/L (ref 22–32)
Calcium: 8.6 mg/dL — ABNORMAL LOW (ref 8.9–10.3)
Creatinine, Ser: 0.67 mg/dL (ref 0.61–1.24)
GFR calc Af Amer: >60 mL/min (ref >60)
GFR calc non Af Amer: >60 mL/min (ref >60)
Glucose, Bld: 160 mg/dL — ABNORMAL HIGH (ref 65–99)
Potassium: 3.6 mmol/L (ref 3.5–5.1)
SODIUM: 137 mmol/L (ref 135–145)
TOTAL PROTEIN: 6.6 g/dL (ref 6.5–8.1)
Total Bilirubin: 0.7 mg/dL (ref 0.3–1.2)

## 2016-06-28 LAB — RAPID URINE DRUG SCREEN, HOSP PERFORMED
AMPHETAMINES: NOT DETECTED
BARBITURATES: NOT DETECTED
BENZODIAZEPINES: NOT DETECTED
Cocaine: NOT DETECTED
Opiates: NOT DETECTED
TETRAHYDROCANNABINOL: POSITIVE — AB

## 2016-06-28 LAB — SALICYLATE LEVEL: Salicylate Lvl: <7 mg/dL (ref 2.8–30.0)

## 2016-06-28 LAB — ACETAMINOPHEN LEVEL: Acetaminophen (Tylenol), Serum: <10 ug/mL — ABNORMAL LOW (ref 10–30)

## 2016-06-28 LAB — ETHANOL: Alcohol, Ethyl (B): <5 mg/dL (ref <5)

## 2016-06-28 MED ORDER — LISINOPRIL-HYDROCHLOROTHIAZIDE 20-25 MG PO TABS: 1.0000 | ORAL_TABLET | Freq: Every day | ORAL | Status: DC

## 2016-06-28 MED ORDER — OXCARBAZEPINE 300 MG PO TABS
300.0000 mg | ORAL_TABLET | Freq: Two times a day (BID) | ORAL | Status: DC
  Administered 2016-06-29 – 2016-07-03 (×9): 300 mg via ORAL
  Filled 2016-06-28: qty 1
  Filled 2016-06-28: qty 28
  Filled 2016-06-28 (×8): qty 1
  Filled 2016-06-28: qty 28
  Filled 2016-06-28 (×3): qty 1

## 2016-06-28 MED ORDER — METOPROLOL TARTRATE 25 MG PO TABS
25.0000 mg | ORAL_TABLET | Freq: Two times a day (BID) | ORAL | Status: DC
Start: 2016-06-28 — End: 2016-07-03
  Administered 2016-06-28 – 2016-07-03 (×10): 25 mg via ORAL
  Filled 2016-06-28 (×7): qty 1
  Filled 2016-06-28: qty 28
  Filled 2016-06-28 (×4): qty 1
  Filled 2016-06-28: qty 28
  Filled 2016-06-28 (×2): qty 1

## 2016-06-28 MED ORDER — METOPROLOL TARTRATE 25 MG PO TABS
25.0000 mg | ORAL_TABLET | Freq: Two times a day (BID) | ORAL | Status: DC
  Administered 2016-06-28: 25 mg via ORAL
  Filled 2016-06-28: qty 1

## 2016-06-28 MED ORDER — ATORVASTATIN CALCIUM 10 MG PO TABS
10.0000 mg | ORAL_TABLET | Freq: Every day | ORAL | Status: DC
  Administered 2016-06-29 – 2016-07-02 (×4): 10 mg via ORAL
  Filled 2016-06-28 (×3): qty 1
  Filled 2016-06-28: qty 14
  Filled 2016-06-28 (×2): qty 1

## 2016-06-28 MED ORDER — MAGNESIUM HYDROXIDE 400 MG/5ML PO SUSP: 30.0000 mL | Freq: Every day | ORAL | Status: DC | PRN

## 2016-06-28 MED ORDER — ALUM & MAG HYDROXIDE-SIMETH 200-200-20 MG/5ML PO SUSP: 30.0000 mL | ORAL | Status: DC | PRN

## 2016-06-28 MED ORDER — LISINOPRIL 20 MG PO TABS
20.0000 mg | ORAL_TABLET | Freq: Every day | ORAL | Status: DC
  Filled 2016-06-28: qty 1

## 2016-06-28 MED ORDER — PANTOPRAZOLE SODIUM 40 MG PO TBEC: 40.0000 mg | DELAYED_RELEASE_TABLET | Freq: Every day | ORAL | Status: DC

## 2016-06-28 MED ORDER — DILTIAZEM HCL ER COATED BEADS 180 MG PO CP24
180.0000 mg | ORAL_CAPSULE | Freq: Every day | ORAL | Status: DC
  Administered 2016-06-28: 180 mg via ORAL
  Filled 2016-06-28: qty 1

## 2016-06-28 MED ORDER — RISPERIDONE 1 MG PO TABS
1.0000 mg | ORAL_TABLET | Freq: Two times a day (BID) | ORAL | Status: DC
  Administered 2016-06-29 – 2016-07-03 (×9): 1 mg via ORAL
  Filled 2016-06-28 (×5): qty 1
  Filled 2016-06-28: qty 28
  Filled 2016-06-28 (×3): qty 1
  Filled 2016-06-28: qty 28
  Filled 2016-06-28 (×4): qty 1

## 2016-06-28 MED ORDER — SIMVASTATIN 20 MG PO TABS: 20.0000 mg | ORAL_TABLET | Freq: Every day | ORAL | Status: DC

## 2016-06-28 MED ORDER — RISPERIDONE 1 MG PO TABS
1.0000 mg | ORAL_TABLET | Freq: Two times a day (BID) | ORAL | Status: DC
  Administered 2016-06-28: 1 mg via ORAL
  Filled 2016-06-28: qty 1

## 2016-06-28 MED ORDER — ACETAMINOPHEN 325 MG PO TABS
650.0000 mg | ORAL_TABLET | Freq: Four times a day (QID) | ORAL | Status: DC | PRN
  Administered 2016-06-29 – 2016-07-02 (×4): 650 mg via ORAL
  Filled 2016-06-28 (×4): qty 2

## 2016-06-28 MED ORDER — ATORVASTATIN CALCIUM 10 MG PO TABS
10.0000 mg | ORAL_TABLET | Freq: Every day | ORAL | Status: DC
  Filled 2016-06-28: qty 1

## 2016-06-28 MED ORDER — OXCARBAZEPINE 300 MG PO TABS
300.0000 mg | ORAL_TABLET | Freq: Two times a day (BID) | ORAL | Status: DC
  Administered 2016-06-28: 300 mg via ORAL
  Filled 2016-06-28: qty 1

## 2016-06-28 MED ORDER — APIXABAN 5 MG PO TABS
5.0000 mg | ORAL_TABLET | Freq: Two times a day (BID) | ORAL | Status: DC
  Administered 2016-06-28: 5 mg via ORAL
  Filled 2016-06-28 (×2): qty 1

## 2016-06-28 MED ORDER — HYDROCHLOROTHIAZIDE 25 MG PO TABS
25.0000 mg | ORAL_TABLET | Freq: Every day | ORAL | Status: DC
  Filled 2016-06-28: qty 1

## 2016-06-28 MED ORDER — DILTIAZEM HCL ER COATED BEADS 180 MG PO CP24
180.0000 mg | ORAL_CAPSULE | Freq: Every day | ORAL | Status: DC
  Administered 2016-06-29 – 2016-07-03 (×5): 180 mg via ORAL
  Filled 2016-06-28 (×3): qty 1
  Filled 2016-06-28: qty 14
  Filled 2016-06-28 (×3): qty 1

## 2016-06-28 MED ORDER — APIXABAN 5 MG PO TABS
5.0000 mg | ORAL_TABLET | Freq: Two times a day (BID) | ORAL | Status: DC
  Administered 2016-06-29 – 2016-07-03 (×9): 5 mg via ORAL
  Filled 2016-06-28: qty 28
  Filled 2016-06-28 (×11): qty 1
  Filled 2016-06-28: qty 28
  Filled 2016-06-28: qty 1

## 2016-06-28 MED ORDER — NICOTINE 21 MG/24HR TD PT24
21.0000 mg | MEDICATED_PATCH | Freq: Every day | TRANSDERMAL | Status: DC
  Administered 2016-06-29 – 2016-07-03 (×6): 21 mg via TRANSDERMAL
  Filled 2016-06-28 (×2): qty 1
  Filled 2016-06-28: qty 14
  Filled 2016-06-28 (×4): qty 1

## 2016-06-28 NOTE — ED Notes (Signed)
Bed: [id number] Expected date:  Expected time:  Means of arrival:  [facility]

## 2016-06-28 NOTE — ED Notes (Signed)
Patient aware that urine sample is needed.
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
## 2016-06-28 NOTE — ED Notes (Signed)
Pharmacy notified that ED was unable to give medications due to meds not being verified.

## 2016-06-28 NOTE — ED Notes (Signed)
Charge nurse reported that patient was medically cleared and could go to behavioral health.  Report [facility], but [facility] needed to have [facility] results first.

## 2016-06-28 NOTE — H&P (Signed)
Behavioral Health Medical Screening Exam  Gary Buck is an 51 y.o. male.  Total Time spent with patient: 20 minutes  Psychiatric Specialty Exam: Physical Exam  Nursing note and vitals reviewed. Constitutional: He is oriented to person, place, and time. No distress.  HENT:  Head: Normocephalic.  Eyes: Conjunctivae are normal. Right eye exhibits no discharge. Left eye exhibits no discharge.  Neck: Normal range of motion. No JVD present. No tracheal deviation present. No thyromegaly present.  Cardiovascular: Normal rate, regular rhythm, normal heart sounds and intact distal pulses.  Exam reveals no gallop and no friction rub.   No murmur heard. Respiratory: Effort normal and breath sounds normal. No stridor. No respiratory distress. He has no wheezes. He has no rales. He exhibits no tenderness.  GI:  Deferred   Genitourinary:  Genitourinary Comments: deferred  Musculoskeletal: Normal range of motion. He exhibits edema. He exhibits no tenderness or deformity.  +2 Pitting edema left lower extremity   Lymphadenopathy:    He has no cervical adenopathy.  Neurological: He is alert and oriented to person, place, and time.  Skin: Skin is warm. No rash noted. He is not diaphoretic. No erythema. No pallor.    ROS  There were no vitals taken for this visit.There is no height or weight on file to calculate BMI.  General Appearance: Fairly Groomed  Eye Contact:  Good  Speech:  Clear and Coherent and Normal Rate  Volume:  Normal  Mood:  Angry and Irritable  Affect:  Congruent  Thought Process:  Coherent and Descriptions of Associations: Intact  Orientation:  Full (Time, Place, and Person)  Thought Content:  Hallucinations: Auditory  Suicidal Thoughts:  No  Homicidal Thoughts:  Yes.  without intent/plan  Memory:  Immediate;   Fair Recent;   Fair  Judgement:  Poor  Insight:  Lacking and Shallow  Psychomotor Activity:  Normal  Concentration: Concentration: Fair and Attention Span:  Fair  Recall:  [name]  Fund of Knowledge:Fair  Language: Good  Akathisia:  Negative  Handed:  Right  AIMS (if indicated):     Assets:  Communication Skills Desire for Improvement Leisure Time Resilience Social Support  Sleep:       Musculoskeletal: Strength & Muscle Tone: within normal limits Gait & Station: normal Patient leans: N/A  There were no vitals taken for this visit.  Recommendations:  Based on my evaluation the patient does not appear to have an emergency medical condition.  Denzil MagnusonLaShunda Kyandre Okray, NP 06/28/2016, 11:38 AM

## 2016-06-28 NOTE — BH Assessment (Signed)
[facility]  Per West CarboLashonda, NP - patient meets criteria for inpatient hospitalization.  [facility] Mard[facility] Layman(Lindsey) the Charge Nurse Misty Stanley(Stacey[facility] has been informed that the patient will be coming for medical clearance.

## 2016-06-28 NOTE — ED Notes (Signed)
Called report to behavioral health but patient's BP still not below 150/90.  Charge nurse notified.

## 2016-06-28 NOTE — ED Provider Notes (Addendum)
[facility]-EMERGENCY DEPT Provider Note   CSN: [id number] Arrival date & time: 06/28/16  1154     History   Chief Complaint Chief Complaint  Patient presents with  . Medical Clearance    HPI Gary Buck is a 51 y.o. male.  HPI 51 yo M with PMHx as below including AFib, T2DM, HTN, HLD who presents for medical clearance. Pt has h/o bipolar disorder and has been off meds for months. Using cocaine intermittently to "self-treat." Over the same time period, he has noticed increasing paranoia and depression. No current SI, HI, AVH. He has not been taking any other meds. He presented to [facility] today and was admitted, but sent here for medical eval.  Denies any CP, SOB, palpitations, HA, vision changes, flank pain.  Past Medical History:  Diagnosis Date  . Anginal pain (HCC)   . Bipolar disorder (HCC)   . Depression   . ETOH abuse   . H/O cocaine abuse   . Hypercholesterolemia   . Hypertension   . PAF (paroxysmal atrial fibrillation) (HCC)    [name] 12/21/2014  . Stroke [facility]) 2004   pt sts about 12 years ago.   . Tobacco abuse   . Type II diabetes mellitus (HCC) dx'd 12/21/2014    Patient Active Problem List   Diagnosis Date Noted  . Bipolar affective disorder, mixed, severe (HCC) 06/28/2016  . Atrial fibrillation, unspecified   . Elevated troponin   . Atrial fibrillation with RVR (HCC) 12/21/2014  . CAD (coronary artery disease) 12/21/2014  . Chest pain   . Essential hypertension   . Remote history of stroke   . Polysubstance abuse     Past Surgical History:  Procedure Laterality Date  . CYSTECTOMY  ~ 2004   "on the top of my forehead"  . INGUINAL HERNIA REPAIR Left ~ 2012       Home Medications    Prior to Admission medications   Medication Sig Start Date End Date Taking? Authorizing Provider  Aspirin-Acetaminophen-Caffeine (EXCEDRIN EXTRA STRENGTH PO) Take 2 tablets by mouth every 6 (six) hours as needed (For pain.).    Yes Historical Provider, MD  metoprolol  tartrate (LOPRESSOR) 25 MG tablet Take 1 tablet (25 mg total) by mouth 2 (two) times daily. 12/24/14  Yes Bhavinkumar Bhagat, PA  apixaban (ELIQUIS) 5 MG TABS tablet Take 1 tablet (5 mg total) by mouth 2 (two) times daily. 12/24/14   Bhavinkumar Bhagat, PA  diltiazem (CARDIZEM CD) 180 MG 24 hr capsule Take 1 capsule (180 mg total) by mouth daily. 12/24/14   Bhavinkumar Bhagat, PA  lisinopril-hydrochlorothiazide (PRINZIDE,ZESTORETIC) 20-25 MG per tablet Take 1 tablet by mouth daily.    Historical Provider, MD  simvastatin (ZOCOR) 20 MG tablet Take 20 mg by mouth daily.    Historical Provider, MD    Family History Family History  Problem Relation Age of Onset  . Diabetes Mellitus II Mother     Social History Social History  Substance Use Topics  . Smoking status: Current Every Day Smoker    Packs/day: 1.00    Years: 32.00    Types: Cigarettes  . Smokeless tobacco: Never Used  . Alcohol use 32.4 oz/week    54 Cans of beer per week     Comment: 12/21/2014 "2-3, 24oz  beers per night during work nights, 6 24oz beer per day on the weekends"     Allergies   Patient has no known allergies.   Review of Systems Review of Systems  Constitutional: Negative for  chills and fever.  HENT: Negative for ear pain and sore throat.   Eyes: Negative for pain and visual disturbance.  Respiratory: Negative for cough and shortness of breath.   Cardiovascular: Negative for chest pain and palpitations.  Gastrointestinal: Negative for abdominal pain and vomiting.  Genitourinary: Negative for dysuria and hematuria.  Musculoskeletal: Negative for arthralgias and back pain.  Skin: Negative for color change and rash.  Neurological: Negative for seizures and syncope.  All other systems reviewed and are negative.    Physical Exam Updated Vital Signs BP 194/90 (BP Location: Right Arm)   Pulse 79   Temp 98.4 F (36.9 C) (Oral)   Resp 20   Ht 6\' 5"  (1.956 m)   Wt 285 lb (129.3 kg)   SpO2 99%   BMI 33.80  kg/m   Physical Exam  Constitutional: He is oriented to person, place, and time. He appears well-developed and well-nourished. No distress.  HENT:  Head: Normocephalic and atraumatic.  Eyes: Conjunctivae are normal.  Neck: Neck supple.  Cardiovascular: Normal rate, regular rhythm and normal heart sounds.  Exam reveals no friction rub.   No murmur heard. Pulmonary/Chest: Effort normal and breath sounds normal. No respiratory distress. He has no wheezes. He has no rales.  Abdominal: He exhibits no distension.  Musculoskeletal: He exhibits no edema.  Neurological: He is alert and oriented to person, place, and time. He exhibits normal muscle tone.  Skin: Skin is warm. Capillary refill takes less than 2 seconds.  Psychiatric: He has a normal mood and affect. His speech is normal and behavior is normal. Thought content normal.  Nursing note and vitals reviewed.    ED Treatments / Results  Labs (all labs ordered are listed, but only abnormal results are displayed) Labs Reviewed  COMPREHENSIVE METABOLIC PANEL - Abnormal; Notable for the following:       Result Value   Glucose, Bld 160 (*)    Calcium 8.6 (*)    ALT 14 (*)    All other components within normal limits  ACETAMINOPHEN LEVEL - Abnormal; Notable for the following:    Acetaminophen (Tylenol), Serum <10 (*)    All other components within normal limits  CBC - Abnormal; Notable for the following:    WBC 12.0 (*)    All other components within normal limits  RAPID URINE DRUG SCREEN, HOSP PERFORMED - Abnormal; Notable for the following:    Tetrahydrocannabinol POSITIVE (*)    All other components within normal limits  ETHANOL  SALICYLATE LEVEL    EKG  EKG Interpretation  Date/Time:  Thursday June 28 2016 13:06:39 EST Ventricular Rate:  70 PR Interval:  190 QRS Duration: 112 QT Interval:  424 QTC Calculation: 457 R Axis:   -31 Text Interpretation:  Normal sinus rhythm with sinus arrhythmia Possible Left atrial  enlargement Left axis deviation Left ventricular hypertrophy Cannot rule out Septal infarct , age undetermined No significant change since last tracing Confirmed by Juno Alers MD, Sheria LangAMERON [phone]([id number]) on 06/28/2016 4:02:32 PM       Radiology No results found.  Procedures Procedures (including critical care time)  Medications Ordered in ED Medications  diltiazem (CARDIZEM CD) 24 hr capsule 180 mg (180 mg Oral Given 06/28/16 1522)  metoprolol tartrate (LOPRESSOR) tablet 25 mg (25 mg Oral Given 06/28/16 1601)  apixaban (ELIQUIS) tablet 5 mg (5 mg Oral Given 06/28/16 1522)  Oxcarbazepine (TRILEPTAL) tablet 300 mg (300 mg Oral Given 06/28/16 1602)  risperiDONE (RISPERDAL) tablet 1 mg (1 mg Oral Given 06/28/16  1602)  atorvastatin (LIPITOR) tablet 10 mg (not administered)     Initial Impression / Assessment and Plan / ED Course  I have reviewed the triage vital signs and the nursing notes.  Pertinent labs & imaging results that were available during my care of the patient were reviewed by me and considered in my medical decision making (see chart for details).  Clinical Course     51 yo M with PMHx as above including pAF, HTN, HLD, BPAD here for medical clearance. On arrival, pt hypertensive but o/w HDS. Currently asymptomatic - no CP, SOB, palpitations, HA, vision changes, or s/s hypertensive emergency or urgency. Lab work unremarkable. Of note, pt has not been on anti-HTN meds "for months." Will restart dilt and metoprolol for combined HTN/rate control, restart eliquis (no recent trauma, no contraindications), and clear once BP improved. Can re-start outpt meds as needed.  ADDENDUM: BP improving slowly on dilt and metoprolol. Goal SBP is 25% reduction, so he is now at goal. Will plan to continue these meds today, re-start lisinopril tomorrow if BP remains elevated.   Final Clinical Impressions(s) / ED Diagnoses   Final diagnoses:  Bipolar affective disorder, mixed, severe (HCC)  Asymptomatic  hypertension      Shaune Pollack, MD 06/28/16 1604    Shaune Pollack, MD 06/28/16 1711

## 2016-06-28 NOTE — Progress Notes (Signed)
Nursing Progress Note 7p-7a  D) Patient presents pleasant and cooperative. Patient denies SI/HI/AVH or pain. Patient contracts for safety at this time. Patient is seen interacting in the milieu. Patient states he is "doing okay so far".  A) Emotional support given. PM medications held due to patient receiving [facility] the ED [facility]. Lopressor given due to elevated BP 165/97. Medications reviewed with patient. Patient on q15 min safety checks. Opportunities for questions or concerns presented to patient. Patient encouraged to work on treatment goals.  R) Patient receptive to interaction with nurse. Patient remains safe on the unit at this time. Patient is resting in bed without complaints. Will continue to monitor.

## 2016-06-28 NOTE — Discharge Instructions (Signed)
We restarted Mr. Gary Buck metoprolol and diltiazem.  If blood pressure remains elevated tomorrow (>150/90), you can resume lisinopril 10 mg daily. Consider addition of HCTZ if this does not improve over the next 1-2 days, although his potassium will need to be monitored closely.

## 2016-06-28 NOTE — ED Provider Notes (Signed)
Patient is medically cleared. Pt has hx of HTN. He appears to have been non compliant with his meds. Pt also has hx of afib.  Pt came in with elevated BP. Dr. Erma HeritageIsaacs started pt on his anti-hypertensive. Labs show no new renal failure. The EKG has no new changes. Pt's BP has improved slowly after the meds started.  Pt can be restarted on his home BP meds. It seems like his lisinopril was held, and we will start that as well. Literature has never supported aggressive BP management in a patient without end organ damage. In fact it is dangerous to give iv meds to treat a number, and not the patient - as we place patients at risk for stroke by correcting BP too quickly.  This patient is medically cleared. We will not give iv antihypertensives, as appeasement of that request can lead to patient harm.   Derwood KaplanAnkit Dorion Petillo, MD 06/28/16 [phone]

## 2016-06-28 NOTE — ED Triage Notes (Signed)
Patient to the ED to get medical clearance for a bed waiting for him at behavioral health.  Patient reports he is easily agitated recently.  Patient reports he has bipolar disorder but has not taken medications for it in 15 years.  Patient calm and cooperative now.

## 2016-06-28 NOTE — Plan of Care (Signed)
Problem: Safety: Goal: Periods of time without injury will increase Outcome: Progressing Patient remains safe on the unit at this time. Patient contracts for safety and is on q15 minute safety checks. Patient is a low falls risk; fall safety reviewed with patient. Patient able to express needs.   

## 2016-06-28 NOTE — ED Notes (Signed)
Notified EDP concerning patient's BP.  EDP said patient was medically cleared and could be discharged to behavioral health hospital.  [facility]Lindsey [facility] and she okayed for patient to be sent over.  Charge nurse notified.
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
## 2016-06-28 NOTE — Tx Team (Signed)
Initial Treatment Plan 06/28/2016 9:25 PM Gary Buck ZOX:[id number]    PATIENT STRESSORS: Financial difficulties Occupational concerns   PATIENT STRENGTHS: Ability for insight Communication skills Motivation for treatment/growth Supportive family/friends   PATIENT IDENTIFIED PROBLEMS: Depression  Anxiety  Anger  "Be able to live life"  "Get back to some normalcy"             DISCHARGE CRITERIA:  Ability to meet basic life and health needs Adequate post-discharge living arrangements Improved stabilization in mood, thinking, and/or behavior Motivation to continue treatment in a less acute level of care Need for constant or close observation no longer present Verbal commitment to aftercare and medication compliance  PRELIMINARY DISCHARGE PLAN: Outpatient therapy  PATIENT/FAMILY INVOLVEMENT: This treatment plan has been presented to and reviewed with the patient, Gary Buck.  The patient and family have been given the opportunity to ask questions and make suggestions.  Gary Buck, Gary Brunetti Buck, Gary Buck 06/28/2016, 9:25 PM

## 2016-06-28 NOTE — ED Notes (Signed)
Patient denies suicidal or homicidal ideation.  Charge nurse reported that patient has a bed at behavioral health and just needs to be medically cleared then discharged.

## 2016-06-29 DIAGNOSIS — I4891 Unspecified atrial fibrillation: Secondary | ICD-10-CM

## 2016-06-29 DIAGNOSIS — F1010 Alcohol abuse, uncomplicated: Secondary | ICD-10-CM

## 2016-06-29 MED ORDER — ONDANSETRON 4 MG PO TBDP: 4.0000 mg | ORAL_TABLET | Freq: Four times a day (QID) | ORAL | Status: AC | PRN

## 2016-06-29 MED ORDER — LOPERAMIDE HCL 2 MG PO CAPS: 2.0000 mg | ORAL_CAPSULE | ORAL | Status: AC | PRN

## 2016-06-29 MED ORDER — ADULT MULTIVITAMIN W/MINERALS CH
1.0000 | ORAL_TABLET | Freq: Every day | ORAL | Status: DC
  Administered 2016-06-29 – 2016-07-03 (×5): 1 via ORAL
  Filled 2016-06-29 (×8): qty 1

## 2016-06-29 MED ORDER — LORAZEPAM 1 MG PO TABS: 1.0000 mg | ORAL_TABLET | Freq: Four times a day (QID) | ORAL | Status: AC | PRN

## 2016-06-29 MED ORDER — VITAMIN B-1 100 MG PO TABS
100.0000 mg | ORAL_TABLET | Freq: Every day | ORAL | Status: DC
  Administered 2016-06-30 – 2016-07-03 (×4): 100 mg via ORAL
  Filled 2016-06-29: qty 1
  Filled 2016-06-29: qty 14
  Filled 2016-06-29 (×4): qty 1

## 2016-06-29 MED ORDER — VITAMIN B-1 100 MG PO TABS
100.0000 mg | ORAL_TABLET | Freq: Once | ORAL | Status: AC
  Administered 2016-06-29: 100 mg via ORAL
  Filled 2016-06-29 (×2): qty 1

## 2016-06-29 MED ORDER — HYDROXYZINE HCL 25 MG PO TABS: 25.0000 mg | ORAL_TABLET | Freq: Four times a day (QID) | ORAL | Status: AC | PRN

## 2016-06-29 MED ORDER — THIAMINE HCL 100 MG/ML IJ SOLN: 100.0000 mg | Freq: Once | INTRAMUSCULAR | Status: DC

## 2016-06-29 NOTE — Progress Notes (Signed)
Pt's blood pressure continues to be elevated. Pt has no signs or symptoms of distress. Notified NP. No new orders at this time. Will continue to monitor. Safety maintained.

## 2016-06-29 NOTE — Progress Notes (Signed)
Adult Psychoeducational Group Note  Date:  06/29/2016 Time:  8:00 PM  Group Topic/Focus:  Wrap-Up Group:   The focus of this group is to help patients review their daily goal of treatment and discuss progress on daily workbooks.   Participation Level:  Active  Participation Quality:  Appropriate  Affect:  Appropriate  Cognitive:  Alert  Insight: Good  Engagement in Group:  Engaged  Modes of Intervention:  Discussion and Education  Additional Comments:  Patient reported that he wanted on communicating further with the Social Worker to obtain appropriate accommodations after his discharge.    Elmore GuiseSLOAN, Samad Thon N 06/29/2016, 9:55 PM

## 2016-06-29 NOTE — Consult Note (Signed)
Phone only Consult for medical management  51 year old male history of atrial fibrillation, coronary disease, hypertension, polysubstance abuse, noncompliance of medications, [facility] and alcohol abuse. It seems the patient has not been taking his medications for quite some time, length unknown. Patient does have uncontrolled hypertension, 170/89. Currently not hypoxic not tachycardic or tachypneic. No reason to believe the patient could have PE or DVT. Suspect patient was taking Eliquis for his atrial fibrillation. Given that patient is using this for prophylaxis, the dose will be 5 mg twice daily. Patient does not have any renal impairment. Patient may restart his Cardizem, metoprolol, lisinopril/HCTZ at his home doses.  Time spent: 10 minutes  Alexah Kivett D.O. [facility] Pager [phone]  If 7PM-7AM, please contact night-coverage [email or web address] Password [email or web address] 06/29/2016, 4:26 PM

## 2016-06-29 NOTE — Progress Notes (Signed)
Pt's blood glucose 160 from admission. Reported to MD. Pt states that he does not have diabetes. No new orders. Safety maintained.
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
## 2016-06-29 NOTE — Progress Notes (Signed)
Patient ID: Gary Buck, male   DOB: 1966-02-12, 51 y.o.   MRN: [id number]  D: Patient observed watching TV and interacting well with peers in the dayroom. Pt reports he had a good day. Pt c/o lower right toothache. Medication administered with relieve. Denies SI/HI/AVH.No behavioral issues noted.  A: Support and encouragement offered as needed. Medications administered as prescribed.  R: Patient cooperative and appropriate on unit. Pt attended evening wrap up group.

## 2016-06-29 NOTE — Progress Notes (Signed)
Recreation Therapy Notes  Date: 06/29/16 Time: 0930 Location: 300 Hall Dayroom  Group Topic: Stress Management  Goal Area(s) Addresses:  Patient will verbalize importance of using healthy stress management.  Patient will identify positive emotions associated with healthy stress management.   Intervention: Stress Management  Activity :  Choice Meditation.  LRT introduced the stress management technique of meditation.  LRT played a meditation focused on choices to allow patients the opportunity to engage in the activity.  Patients were to follow along with the meditation as it played.  Education:  Stress Management, Discharge Planning.   Education Outcome: Acknowledges edcuation/In group clarification offered/Needs additional education  Clinical Observations/Feedback: Pt did not attend group.    Elvie Maines, LRT/CTRS         Castulo Scarpelli A 06/29/2016 12:32 PM 
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
## 2016-06-29 NOTE — H&P (Addendum)
Psychiatric Admission Assessment Adult  Patient Identification: Gary Buck MRN:  [id number] Date of Evaluation:  06/29/2016 Chief Complaint:  " I get these episodes " Principal Diagnosis:  Bipolar Disorder Mixed , Alcohol Abuse  Patient Active Problem List   Diagnosis Date Noted  . Bipolar affective disorder, mixed, severe ([place]) [F31.63] 06/28/2016  . Bipolar affective disorder, current episode mixed, without psychotic features ([place]) [F31.60] 06/28/2016  . Atrial fibrillation, unspecified [I48.91]   . Elevated troponin [R74.8]   . Atrial fibrillation with RVR ([place]) [I48.91] 12/21/2014  . CAD (coronary artery disease) [I25.10] 12/21/2014  . Chest pain [R07.9]   . Essential hypertension [I10]   . Remote history of stroke [Z86.73]   . Polysubstance abuse [F19.10]    History of Present Illness: 51 year old male . Reports " I get these episodes where I get really irritable, angry". He presented to hospital voluntarily with family member, and reported homicidal ideations . At this time denies wanting to hurt anyone Specifically states " a long list of people I don't like ,but really  I just want to be left alone". States " I know I need help". Reports symptoms such as feeling vaguely agitated, " being confrontational with people". Of note , reports neuro-vegetative symptoms , feels subjectively depressed and endorses anhedonia .  Associated Signs/Symptoms: Depression Symptoms:  depressed mood, anhedonia, insomnia, suicidal thoughts without plan, loss of energy/fatigue, (Hypo) Manic Symptoms:  Irritability, lability, expresses sense of racing thoughts  Anxiety Symptoms:  Does not endorse  Psychotic Symptoms: occasionally hears his name being called- no current hallucinations, no delusions PTSD Symptoms:  At this time does not endorse any current PTSD type symptoms  Total Time spent with patient: 45 minutes  Past Psychiatric History: one prior psychiatric admission about 10 years  ago to [facility], for " agitation , flipping out, and cocaine " In the past has been diagnosed with Bipolar Disorder , endorses episodes of depression and endorses episodes of irritability and feeling vaguely agitated . He also reports history of explosiveness, angry outbursts , which have caused relationship, employment, legal issues  Denies history of psychosis, other than occasionally hearing his name being called   Is the patient at risk to self? Yes.    Has the patient been a risk to self in the past 6 months? Yes.    Has the patient been a risk to self within the distant past? No.  Is the patient a risk to others? Yes.    Has the patient been a risk to others in the past 6 months? Yes.    Has the patient been a risk to others within the distant past? No.   Prior Inpatient Therapy: Prior Inpatient Therapy: No Prior Therapy Dates: NA Prior Therapy Facilty/Provider(s): NA Reason for Treatment: NA Prior Outpatient Therapy: Prior Outpatient Therapy: No Prior Therapy Dates: NA Prior Therapy Facilty/Provider(s): NA Reason for Treatment: NA Does patient have an ACCT team?: No Does patient have Intensive In-House Services?  : No Does patient have [facility] services? : No Does patient have P4CC services?: No  Alcohol Screening: 1. How often do you have a drink containing alcohol?: 2 to 3 times a week 2. How many drinks containing alcohol do you have on a typical day when you are drinking?: 7, 8, or 9 3. How often do you have six or more drinks on one occasion?: Weekly Preliminary Score: 6 4. How often during the last year have you found that you were not able to stop drinking  once you had started?: Never 5. How often during the last year have you failed to do what was normally expected from you becasue of drinking?: Never 6. How often during the last year have you needed a first drink in the morning to get yourself going after a heavy drinking session?: Never 7. How often during the last year  have you had a feeling of guilt of remorse after drinking?: Weekly 8. How often during the last year have you been unable to remember what happened the night before because you had been drinking?: Never 9. Have you or someone else been injured as a result of your drinking?: No 10. Has a relative or friend or a doctor or another health worker been concerned about your drinking or suggested you cut down?: No Alcohol Use Disorder Identification Test Final Score (AUDIT): 12 Brief Intervention: Yes Substance Abuse History in the last 12 months:  History of cocaine abuse, states he has been sober x 8 months. He does endorse alcohol abuse - up to 4 40 ounces of beer several times a week.  Consequences of Substance Abuse:  Previous Psychotropic Medications: in the past has been on Depakote, Risperidone. He states he has not been on any psychiatric medications for years . States he felt Risperidone was helpful but thinks he may have had side effects on Depakote.  Psychological Evaluations:  No  Past Medical History:  Past Medical History:  Diagnosis Date  . Anginal pain ([place])   . Bipolar disorder ([place])   . Depression   . ETOH abuse   . H/O cocaine abuse   . Hypercholesterolemia   . Hypertension   . PAF (paroxysmal atrial fibrillation) ([place])    Archie Endo 12/21/2014  . Stroke [facility]) 2004   pt sts about 12 years ago.   . Tobacco abuse   . Type II diabetes mellitus ([place]) dx'd 12/21/2014    Past Surgical History:  Procedure Laterality Date  . CYSTECTOMY  ~ 2004   "on the top of my forehead"  . INGUINAL HERNIA REPAIR Left ~ 2012   Family History: mother is alive, no contact with/ knowledge of  biological father since childhood. Has two siblings .   Family History  Problem Relation Age of Onset  . Diabetes Mellitus II Mother    Family Psychiatric  History: states he has no knowledge of family psychiatric history - does state father was alcoholic . Tobacco Screening: Have you used any form of tobacco  in the last 30 days? (Cigarettes, Smokeless Tobacco, Cigars, and/or Pipes): Yes Tobacco use, Select all that apply: 5 or more cigarettes per day Are you interested in Tobacco Cessation Medications?: Yes, will notify MD for an order Counseled patient on smoking cessation including recognizing danger situations, developing coping skills and basic information about quitting provided: Yes Social History: single, has two adult daughters, currently homeless , denies legal issues, does temporary work at times.  History  Alcohol Use  . 32.4 oz/week  . 13 Cans of beer per week    Comment: 12/21/2014 "2-3, 24oz  beers per night during work nights, 6 24oz beer per day on the weekends"     History  Drug Use  . Types: Cocaine    Comment: 12/21/2014 "last cocaine was ~ 2 months ago"    Additional Social History: Marital status: Single    History of alcohol / drug use?: Yes Longest period of sobriety (when/how long): 1-2 days Negative Consequences of Use: Financial, Personal relationships, Work / [employer]  Withdrawal Symptoms:  (None Repoted) Name of Substance 1: Alcohol 1 - Age of First Use: 16 1 - Amount (size/oz): pt reports using as much as he can  1 - Frequency: Daily 1 - Duration: For the past 10 years  1 - Last Use / Amount: Last night patient does not remember how much he drank  Allergies:  No Known Allergies Lab Results:  Results for orders placed or performed during the hospital encounter of 06/28/16 (from the past 48 hour(s))  Comprehensive metabolic panel     Status: Abnormal   Collection Time: 06/28/16 12:53 PM  Result Value Ref Range   Sodium 137 135 - 145 mmol/L   Potassium 3.6 3.5 - 5.1 mmol/L   Chloride 104 101 - 111 mmol/L   CO2 26 22 - 32 mmol/L   Glucose, Bld 160 (H) 65 - 99 mg/dL   BUN 13 6 - 20 mg/dL   Creatinine, Ser 0.67 0.61 - 1.24 mg/dL   Calcium 8.6 (L) 8.9 - 10.3 mg/dL   Total Protein 6.6 6.5 - 8.1 g/dL   Albumin 3.8 3.5 - 5.0 g/dL   AST 19 15 - 41 U/L   ALT 14  (L) 17 - 63 U/L   Alkaline Phosphatase 90 38 - 126 U/L   Total Bilirubin 0.7 0.3 - 1.2 mg/dL   GFR calc non Af Amer >60 >60 mL/min   GFR calc Af Amer >60 >60 mL/min    Comment: (NOTE) The eGFR has been calculated using the CKD EPI equation. This calculation has not been validated in all clinical situations. eGFR's persistently <60 mL/min signify possible Chronic Kidney Disease.    Anion gap 7 5 - 15  Ethanol     Status: None   Collection Time: 06/28/16 12:53 PM  Result Value Ref Range   Alcohol, Ethyl (B) <5 <5 mg/dL    Comment:        LOWEST DETECTABLE LIMIT FOR SERUM ALCOHOL IS 5 mg/dL FOR MEDICAL PURPOSES ONLY   Salicylate level     Status: None   Collection Time: 06/28/16 12:53 PM  Result Value Ref Range   Salicylate Lvl <1.9 2.8 - 30.0 mg/dL  Acetaminophen level     Status: Abnormal   Collection Time: 06/28/16 12:53 PM  Result Value Ref Range   Acetaminophen (Tylenol), Serum <10 (L) 10 - 30 ug/mL    Comment:        THERAPEUTIC CONCENTRATIONS VARY SIGNIFICANTLY. A RANGE OF 10-30 ug/mL MAY BE AN EFFECTIVE CONCENTRATION FOR MANY PATIENTS. HOWEVER, SOME ARE BEST TREATED AT CONCENTRATIONS OUTSIDE THIS RANGE. ACETAMINOPHEN CONCENTRATIONS >150 ug/mL AT 4 HOURS AFTER INGESTION AND >50 ug/mL AT 12 HOURS AFTER INGESTION ARE OFTEN ASSOCIATED WITH TOXIC REACTIONS.   cbc     Status: Abnormal   Collection Time: 06/28/16 12:53 PM  Result Value Ref Range   WBC 12.0 (H) 4.0 - 10.5 K/uL   RBC 5.07 4.22 - 5.81 MIL/uL   Hemoglobin 15.7 13.0 - 17.0 g/dL   HCT 46.8 39.0 - 52.0 %   MCV 92.3 78.0 - 100.0 fL   MCH 31.0 26.0 - 34.0 pg   MCHC 33.5 30.0 - 36.0 g/dL   RDW 14.0 11.5 - 15.5 %   Platelets 256 150 - 400 K/uL  Rapid urine drug screen (hospital performed)     Status: Abnormal   Collection Time: 06/28/16  2:09 PM  Result Value Ref Range   Opiates NONE DETECTED NONE DETECTED   Cocaine NONE DETECTED NONE DETECTED  Benzodiazepines NONE DETECTED NONE DETECTED    Amphetamines NONE DETECTED NONE DETECTED   Tetrahydrocannabinol POSITIVE (A) NONE DETECTED   Barbiturates NONE DETECTED NONE DETECTED    Comment:        DRUG SCREEN FOR MEDICAL PURPOSES ONLY.  IF CONFIRMATION IS NEEDED FOR ANY PURPOSE, NOTIFY LAB WITHIN 5 DAYS.        LOWEST DETECTABLE LIMITS FOR URINE DRUG SCREEN Drug Class       Cutoff (ng/mL) Amphetamine      1000 Barbiturate      200 Benzodiazepine   938 Tricyclics       101 Opiates          300 Cocaine          300 THC              50     Blood Alcohol level:  Lab Results  Component Value Date   ETH <5 06/28/2016   ETH <5 75/03/2584    Metabolic Disorder Labs:  Lab Results  Component Value Date   HGBA1C 6.2 (H) 12/22/2014   MPG 131 12/22/2014   MPG 128 (H) 06/26/2010   No results found for: PROLACTIN Lab Results  Component Value Date   CHOL 169 12/22/2014   TRIG 158 (H) 12/22/2014   HDL 47 12/22/2014   CHOLHDL 3.6 12/22/2014   VLDL 32 12/22/2014   LDLCALC 90 12/22/2014   LDLCALC (H) 06/26/2010    159        Total Cholesterol/HDL:CHD Risk Coronary Heart Disease Risk Table                     Men   Women  1/2 Average Risk   3.4   3.3  Average Risk       5.0   4.4  2 X Average Risk   9.6   7.1  3 X Average Risk  23.4   11.0        Use the calculated Patient Ratio above and the CHD Risk Table to determine the patient's CHD Risk.        ATP III CLASSIFICATION (LDL):  <100     mg/dL   Optimal  100-129  mg/dL   Near or Above                    Optimal  130-159  mg/dL   Borderline  160-189  mg/dL   High  >190     mg/dL   Very High    Current Medications: Current Facility-Administered Medications  Medication Dose Route Frequency Provider Last Rate Last Dose  . acetaminophen (TYLENOL) tablet 650 mg  650 mg Oral Q6H PRN Patrecia Pour, NP      . alum & mag hydroxide-simeth (MAALOX/MYLANTA) 200-200-20 MG/5ML suspension 30 mL  30 mL Oral Q4H PRN Patrecia Pour, NP      . apixaban (ELIQUIS) tablet 5 mg   5 mg Oral BID Patrecia Pour, NP   5 mg at 06/29/16 0826  . atorvastatin (LIPITOR) tablet 10 mg  10 mg Oral q1800 Patrecia Pour, NP      . diltiazem (CARDIZEM CD) 24 hr capsule 180 mg  180 mg Oral Daily Patrecia Pour, NP   180 mg at 06/29/16 0826  . magnesium hydroxide (MILK OF MAGNESIA) suspension 30 mL  30 mL Oral Daily PRN Patrecia Pour, NP      . metoprolol tartrate (LOPRESSOR) tablet 25 mg  25 mg Oral BID Patrecia Pour, NP   25 mg at 06/29/16 4765  . nicotine (NICODERM CQ - dosed in mg/24 hours) patch 21 mg  21 mg Transdermal Daily Rozetta Nunnery, NP   21 mg at 06/29/16 0826  . Oxcarbazepine (TRILEPTAL) tablet 300 mg  300 mg Oral BID Patrecia Pour, NP   300 mg at 06/29/16 0826  . risperiDONE (RISPERDAL) tablet 1 mg  1 mg Oral BID Patrecia Pour, NP   1 mg at 06/29/16 4650   PTA Medications: Prescriptions Prior to Admission  Medication Sig Dispense Refill Last Dose  . apixaban (ELIQUIS) 5 MG TABS tablet Take 1 tablet (5 mg total) by mouth 2 (two) times daily. 60 tablet 12 6 months ago  . Aspirin-Acetaminophen-Caffeine (EXCEDRIN EXTRA STRENGTH PO) Take 2 tablets by mouth every 6 (six) hours as needed (For pain.).    2 weeks ago  . diltiazem (CARDIZEM CD) 180 MG 24 hr capsule Take 1 capsule (180 mg total) by mouth daily. 30 capsule 11 6 months ago  . lisinopril-hydrochlorothiazide (PRINZIDE,ZESTORETIC) 20-25 MG per tablet Take 1 tablet by mouth daily.   6 months ago  . metoprolol tartrate (LOPRESSOR) 25 MG tablet Take 1 tablet (25 mg total) by mouth 2 (two) times daily. 60 tablet 11 6 months ago  . simvastatin (ZOCOR) 20 MG tablet Take 20 mg by mouth daily.   6 months ago    Musculoskeletal: Strength & Muscle Tone: within normal limits Gait & Station: normal Patient leans: N/A  Psychiatric Specialty Exam: Physical Exam  Review of Systems  Constitutional: Negative.   HENT: Negative.   Eyes: Positive for blurred vision.  Respiratory: Negative.   Cardiovascular: Negative.    Gastrointestinal: Negative.   Genitourinary: Negative.   Musculoskeletal: Positive for joint pain.       Knee pain   Skin: Negative.   Neurological: Negative for seizures.  Endo/Heme/Allergies: Negative.   Psychiatric/Behavioral: Positive for depression.    Blood pressure (!) 170/89, pulse 62, temperature 98.7 F (37.1 C), temperature source Oral, resp. rate 18, height 6' 5.5" (1.969 m), weight 133.4 kg (294 lb).Body mass index is 34.41 kg/m.  General Appearance: Fairly Groomed  Eye Contact:  Good  Speech:  Normal Rate  Volume:  loud at times   Mood:  states he is feeling better today  Affect:  somewhat expansive, vaguely irritable at times   Thought Process:  Linear  Orientation:  Other:  fully alert and attentive   Thought Content:  Does not endorse any current hallucinations , does not appear internally preoccupied, no delusions expressed   Suicidal Thoughts:  No denies any suicidal or self injurious ideations, denies any homicidal or violent ideations  Homicidal Thoughts:  No  Memory:  recent and remote grossly intact   Judgement:  Fair  Insight:  Fair  Psychomotor Activity:  no psychomotor restlessness   Concentration:  Concentration: Good and Attention Span: Good  Recall:  Good  Fund of Knowledge:  Good  Language:  Good  Akathisia:  Negative  Handed:  Right  AIMS (if indicated):     Assets:  Desire for Improvement Resilience  ADL's:  Intact  Cognition:  WNL  Sleep:       Treatment Plan Summary: Daily contact with patient to assess and evaluate symptoms and progress in treatment, Medication management, Plan inpatient admission  and medications as below  Observation Level/Precautions:  15 minute checks  Laboratory: as needed - Hgb A1C, Lipid Panel, Prolactin,  EKG, TSH  Psychotherapy:  Group therapy, support   Medications:  Patient has been started on Trileptal 300 mgrs BID for mood disorder, and has been started on Risperidone 1 mgr BID for mood disorder, reported  halls. He denies side effects   Patient has history of CAD, A Fib. I have reviewed medication management with Hospitalist Consultant, recommended to continue current regimen at this time  Consultations:  As needed   Discharge Concerns:  - homelessness  Estimated LOS:- 6 days   Other:     Physician Treatment Plan for Primary Diagnosis:  Bipolar Disorder, Mixed  Long Term Goal(s): Improvement in symptoms so as ready for discharge  Short Term Goals: Ability to verbalize feelings will improve, Ability to disclose and discuss suicidal ideas, Ability to demonstrate self-control will improve, Ability to identify and develop effective coping behaviors will improve and Ability to maintain clinical measurements within normal limits will improve  Physician Treatment Plan for Secondary Diagnosis: Active Problems:   Bipolar affective disorder, current episode mixed, without psychotic features ([place])  Long Term Goal(s): Improvement in symptoms so as ready for discharge  Short Term Goals: Ability to verbalize feelings will improve, Ability to disclose and discuss suicidal ideas, Ability to demonstrate self-control will improve, Ability to identify and develop effective coping behaviors will improve, Ability to maintain clinical measurements within normal limits will improve, Compliance with prescribed medications will improve and Ability to identify triggers associated with substance abuse/mental health issues will improve  I certify that inpatient services furnished can reasonably be expected to improve the patient's condition.    Neita Garnet, MD 1/12/20181:02 PM

## 2016-06-29 NOTE — Plan of Care (Signed)
Problem: Activity: Goal: Sleeping patterns will improve Outcome: Progressing Pt reports sleeping fair last night.  Problem: Health Behavior/Discharge Planning: Goal: Compliance with treatment plan for underlying cause of condition will improve Outcome: Progressing Pt is taking medications as prescribed and maintaining self control on the unit.  Problem: Safety: Goal: Periods of time without injury will increase Outcome: Progressing No self injurious behaviors observed or expressed

## 2016-06-29 NOTE — Tx Team (Signed)
Interdisciplinary Treatment and Diagnostic Plan Update  06/29/2016 Time of Session: 5:42 PM  Gary Buck MRN: [id number]  Principal Diagnosis: Bipolar affective disorder, current episode mixed, without psychotic features ([place])  Secondary Diagnoses: Principal Problem:   Bipolar affective disorder, current episode mixed, without psychotic features ([place])   Current Medications:  Current Facility-Administered Medications  Medication Dose Route Frequency Provider Last Rate Last Dose  . acetaminophen (TYLENOL) tablet 650 mg  650 mg Oral Q6H PRN Patrecia Pour, NP   650 mg at 06/29/16 1411  . alum & mag hydroxide-simeth (MAALOX/MYLANTA) 200-200-20 MG/5ML suspension 30 mL  30 mL Oral Q4H PRN Patrecia Pour, NP      . apixaban (ELIQUIS) tablet 5 mg  5 mg Oral BID Patrecia Pour, NP   5 mg at 06/29/16 1701  . atorvastatin (LIPITOR) tablet 10 mg  10 mg Oral q1800 Patrecia Pour, NP   10 mg at 06/29/16 1702  . diltiazem (CARDIZEM CD) 24 hr capsule 180 mg  180 mg Oral Daily Patrecia Pour, NP   180 mg at 06/29/16 8416  . hydrOXYzine (ATARAX/VISTARIL) tablet 25 mg  25 mg Oral Q6H PRN Jenne Campus, MD      . loperamide (IMODIUM) capsule 2-4 mg  2-4 mg Oral PRN Jenne Campus, MD      . LORazepam (ATIVAN) tablet 1 mg  1 mg Oral Q6H PRN Myer Peer Cobos, MD      . magnesium hydroxide (MILK OF MAGNESIA) suspension 30 mL  30 mL Oral Daily PRN Patrecia Pour, NP      . metoprolol tartrate (LOPRESSOR) tablet 25 mg  25 mg Oral BID Patrecia Pour, NP   25 mg at 06/29/16 1702  . multivitamin with minerals tablet 1 tablet  1 tablet Oral Daily Jenne Campus, MD   1 tablet at 06/29/16 1700  . nicotine (NICODERM CQ - dosed in mg/24 hours) patch 21 mg  21 mg Transdermal Daily Rozetta Nunnery, NP   21 mg at 06/29/16 0826  . ondansetron (ZOFRAN-ODT) disintegrating tablet 4 mg  4 mg Oral Q6H PRN Jenne Campus, MD      . Oxcarbazepine (TRILEPTAL) tablet 300 mg  300 mg Oral BID Patrecia Pour, NP   300 mg at  06/29/16 1701  . risperiDONE (RISPERDAL) tablet 1 mg  1 mg Oral BID Patrecia Pour, NP   1 mg at 06/29/16 1701  . [START ON 06/30/2016] thiamine (VITAMIN B-1) tablet 100 mg  100 mg Oral Daily Jenne Campus, MD        PTA Medications: Prescriptions Prior to Admission  Medication Sig Dispense Refill Last Dose  . apixaban (ELIQUIS) 5 MG TABS tablet Take 1 tablet (5 mg total) by mouth 2 (two) times daily. 60 tablet 12 6 months ago  . Aspirin-Acetaminophen-Caffeine (EXCEDRIN EXTRA STRENGTH PO) Take 2 tablets by mouth every 6 (six) hours as needed (For pain.).    2 weeks ago  . diltiazem (CARDIZEM CD) 180 MG 24 hr capsule Take 1 capsule (180 mg total) by mouth daily. 30 capsule 11 6 months ago  . lisinopril-hydrochlorothiazide (PRINZIDE,ZESTORETIC) 20-25 MG per tablet Take 1 tablet by mouth daily.   6 months ago  . metoprolol tartrate (LOPRESSOR) 25 MG tablet Take 1 tablet (25 mg total) by mouth 2 (two) times daily. 60 tablet 11 6 months ago  . simvastatin (ZOCOR) 20 MG tablet Take 20 mg by mouth daily.   6 months ago  Treatment Modalities: Medication Management, Group therapy, Case management,  1 to 1 session with clinician, Psychoeducation, Recreational therapy.  Patient Stressors: Financial difficulties Occupational concerns  Patient Strengths: Ability for Estate manager/land agent for treatment/growth Supportive family/friends  Physician Treatment Plan for Primary Diagnosis: Bipolar affective disorder, current episode mixed, without psychotic features ([place]) Long Term Goal(s): Improvement in symptoms so as ready for discharge  Short Term Goals: Ability to verbalize feelings will improve Ability to disclose and discuss suicidal ideas Ability to demonstrate self-control will improve Ability to identify and develop effective coping behaviors will improve Ability to maintain clinical measurements within normal limits will improve Ability to verbalize feelings will  improve Ability to disclose and discuss suicidal ideas Ability to demonstrate self-control will improve Ability to identify and develop effective coping behaviors will improve Ability to maintain clinical measurements within normal limits will improve Compliance with prescribed medications will improve Ability to identify triggers associated with substance abuse/mental health issues will improve  Medication Management: Evaluate patient's response, side effects, and tolerance of medication regimen.  Therapeutic Interventions: 1 to 1 sessions, Unit Group sessions and Medication administration.  Evaluation of Outcomes: Not Met  Physician Treatment Plan for Secondary Diagnosis: Principal Problem:   Bipolar affective disorder, current episode mixed, without psychotic features ([place])   Long Term Goal(s): Improvement in symptoms so as ready for discharge  Short Term Goals: Ability to verbalize feelings will improve Ability to disclose and discuss suicidal ideas Ability to demonstrate self-control will improve Ability to identify and develop effective coping behaviors will improve Ability to maintain clinical measurements within normal limits will improve Ability to verbalize feelings will improve Ability to disclose and discuss suicidal ideas Ability to demonstrate self-control will improve Ability to identify and develop effective coping behaviors will improve Ability to maintain clinical measurements within normal limits will improve Compliance with prescribed medications will improve Ability to identify triggers associated with substance abuse/mental health issues will improve  Medication Management: Evaluate patient's response, side effects, and tolerance of medication regimen.  Therapeutic Interventions: 1 to 1 sessions, Unit Group sessions and Medication administration.  Evaluation of Outcomes: Not Met   RN Treatment Plan for Primary Diagnosis: Bipolar affective disorder,  current episode mixed, without psychotic features ([place]) Long Term Goal(s): Knowledge of disease and therapeutic regimen to maintain health will improve  Short Term Goals: Ability to verbalize feelings will improve, Ability to disclose and discuss suicidal ideas and Ability to identify and develop effective coping behaviors will improve  Medication Management: RN will administer medications as ordered by provider, will assess and evaluate patient's response and provide education to patient for prescribed medication. RN will report any adverse and/or side effects to prescribing provider.  Therapeutic Interventions: 1 on 1 counseling sessions, Psychoeducation, Medication administration, Evaluate responses to treatment, Monitor vital signs and CBGs as ordered, Perform/monitor CIWA, COWS, AIMS and Fall Risk screenings as ordered, Perform wound care treatments as ordered.  Evaluation of Outcomes: Not Met   LCSW Treatment Plan for Primary Diagnosis: Bipolar affective disorder, current episode mixed, without psychotic features ([place]) Long Term Goal(s): Safe transition to appropriate next level of care at discharge, Engage patient in therapeutic group addressing interpersonal concerns.  Short Term Goals: Engage patient in aftercare planning with referrals and resources, Facilitate patient progression through stages of change regarding substance use diagnoses and concerns and Increase skills for wellness and recovery  Therapeutic Interventions: Assess for all discharge needs, 1 to 1 time with Social worker, Explore available resources and support systems, Assess  for adequacy in community support network, Educate family and significant other(s) on suicide prevention, Complete Psychosocial Assessment, Interpersonal group therapy.  Evaluation of Outcomes: Not Met   Progress in Treatment: Attending groups: Pt is new to milieu, continuing to assess  Participating in groups: Pt is new to milieu, continuing to  assess  Taking medication as prescribed: Yes, MD continues to assess for medication changes as needed Toleration medication: Yes, no side effects reported at this time Family/Significant other contact made: No, CSW assessing for appropriate contact Patient understands diagnosis: Continuing to assess Discussing patient identified problems/goals with staff: Yes Medical problems stabilized or resolved: Yes Denies suicidal/homicidal ideation: Yes Issues/concerns per patient self-inventory: None Other: N/A  New problem(s) identified: None identified at this time.   New Short Term/Long Term Goal(s): None identified at this time.   Discharge Plan or Barriers: CSW will assess for appropriate discharge plan and relevant barriers.   Reason for Continuation of Hospitalization: Depression Hallucinations Medication stabilization Suicidal ideation Withdrawal symptoms  Estimated Length of Stay: 3-5 days  Attendees: Patient: 06/29/2016  5:42 PM  Physician: Dr. Parke Poisson 06/29/2016  5:42 PM  Nursing: Tilford Pillar, RN 06/29/2016  5:42 PM  RN Care Manager: Lars Pinks, RN 06/29/2016  5:42 PM  Social Worker: Adriana Reams, LCSW; [place], LCSW 06/29/2016  5:42 PM  Recreational Therapist:  06/29/2016  5:42 PM  Other: Lindell Spar, NP; Priscille Loveless, NP 06/29/2016  5:42 PM  Other:  06/29/2016  5:42 PM  Other: 06/29/2016  5:42 PM    Scribe for Treatment Team: Gladstone Lighter, LCSW 06/29/2016 5:42 PM
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
## 2016-06-30 LAB — LIPID PANEL
Cholesterol: 169 mg/dL (ref 0–200)
HDL: 62 mg/dL (ref >40)
LDL Cholesterol: 90 mg/dL (ref 0–99)
Total CHOL/HDL Ratio: 2.7 RATIO
Triglycerides: 83 mg/dL (ref <150)
VLDL: 17 mg/dL (ref 0–40)

## 2016-06-30 LAB — TSH: TSH: 2.104 u[IU]/mL (ref 0.350–4.500)

## 2016-06-30 NOTE — [facility] Group Notes (Signed)
Adult Therapy Group Note  Date:  06/30/2016 Time:  10:00-11:00AM Group Topic/Focus: Compare and contrast patients' current "I am...." statements to the visions patients identified as desirable for their lives one year from now.  An emphasis was placed on how negative self-talk significantly affects how we think, which influences how we behave. For a few minutes, patients were paired up to discuss what "I am..." statements they are hopeful they are making a year from now.   Many expressions of similarities and mutual support were provided among group members.    Patients were left with the task of observing themselves for the day as to any "I am...." statements they make.  Participation Level:  Active  Participation Quality:  Inattentive and Drowsy  Affect:  Blunted  Cognitive:  Oriented  Insight: Limited  Engagement in Group:  Limited  Modes of Intervention:  Activity, Discussion and Support  Additional Comments:  Gary Buck was not engaged in the discussion, had his eyes closed most of the time and left when CSW introduced the small group activity.  [name] 06/30/2016, 1:00 PM

## 2016-06-30 NOTE — Progress Notes (Signed)
[facility] Group Notes:  (Nursing/MHT/Case Management/Adjunct)  Date:  06/30/2016  Time:  10:28 PM  Type of Therapy:  Psychoeducational Skills  Participation Level:  Active  Participation Quality:  Attentive  Affect:  Appropriate  Cognitive:  Appropriate  Insight:  Appropriate  Engagement in Group:  Developing/Improving  Modes of Intervention:  Education  Summary of Progress/Problems: Patient states that he had a good family visit this evening. He also states that he is feeling better since he can focus enough to read. As for the theme of the day, his coping skill will be to use "deep breathing".   Gary Buck, Alder Murri S 06/30/2016, 10:28 PM

## 2016-06-30 NOTE — [facility] Group Notes (Signed)
Goals group  Date:  06/30/2016  Time:  0930  Type of Therapy:  Nurse Education  /  Goals group The group is focused on teaching patients how to set realistic and attainable goals that will help them in their walk to recovery.l  icipation Level:  Active  Participation Quality:  Appropriate  Affect:  Excited  Cognitive:  Oriented  Insight:  Improving  Engagement in Group:  Engaged  Modes of Intervention:  Education  Summary of Progress/Problems:  Rich BraveDuke, Gianluca Chhim Lynn 06/30/2016, 2:38 PM
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
## 2016-06-30 NOTE — [facility] Group Notes (Signed)
Life SKills  Date:  06/30/2016  Time:  1100  Type of Therapy:  Nurse Education  /  Life SKills :  The group focuses on teaching patients  How to identify their needs and how to develop skills needed to get their needs  Met.  Participation Level:  Active  Participation Quality:  Appropriate  Affect:  Irritable  Cognitive:  Disorganized  Insight:  Improving  Engagement in Group:  Monopolizing  Modes of Intervention:  Education  Summary of Progress/Problems:  ,  Lynn 06/30/2016, 5:00 PM 

## 2016-06-30 NOTE — Progress Notes (Signed)
[facility] MD Progress Note  06/30/2016 3:09 PM Gary Buck  MRN:  [id number] Subjective:  Patient states he is feeling better. He reports less sensation of racing thoughts and feeling " calmer". He denies any suicidal ideations, he endorses recent homicidal ideations, not directed at any one specific person, but rather a feeling that he could end up hurting people due to his irritability, anger, explosiveness . At this time , as above, feels calmer, and denies any HI. Denies medication side effects, except for some mild sedation. Objective : I have discussed case with RN staff and have met with patient. Partial improvement compared to admission - he reports feeling calmer, not as irritable, and does present with a less pressured, less loud speech. Staff reports he still presents irritable at times, expressing frustration about slight , minimal issues, but not psycho motorically agitated , not threatening or disruptive. Has been visible in day room, going to groups. Appreciate Hospitalist consultation regarding medication management issues  Labs- TSH and Lipid Panel WNL.  Principal Problem: Bipolar affective disorder, current episode mixed, without psychotic features ([place]) Diagnosis:   Patient Active Problem List   Diagnosis Date Noted  . Bipolar affective disorder, mixed, severe ([place]) [F31.63] 06/28/2016  . Bipolar affective disorder, current episode mixed, without psychotic features ([place]) [F31.60] 06/28/2016  . Atrial fibrillation, unspecified [I48.91]   . Elevated troponin [R74.8]   . Atrial fibrillation with RVR ([place]) [I48.91] 12/21/2014  . CAD (coronary artery disease) [I25.10] 12/21/2014  . Chest pain [R07.9]   . Essential hypertension [I10]   . Remote history of stroke [Z86.73]   . Polysubstance abuse [F19.10]    Total Time spent with patient: 20 minutes  Past Medical History:  Past Medical History:  Diagnosis Date  . Anginal pain ([place])   . Bipolar disorder ([place])   . Depression    . ETOH abuse   . H/O cocaine abuse   . Hypercholesterolemia   . Hypertension   . PAF (paroxysmal atrial fibrillation) ([place])    Archie Endo 12/21/2014  . Stroke [facility]) 2004   pt sts about 12 years ago.   . Tobacco abuse   . Type II diabetes mellitus ([place]) dx'd 12/21/2014    Past Surgical History:  Procedure Laterality Date  . CYSTECTOMY  ~ 2004   "on the top of my forehead"  . INGUINAL HERNIA REPAIR Left ~ 2012   Family History:  Family History  Problem Relation Age of Onset  . Diabetes Mellitus II Mother     Social History:  History  Alcohol Use  . 32.4 oz/week  . 53 Cans of beer per week    Comment: 12/21/2014 "2-3, 24oz  beers per night during work nights, 6 24oz beer per day on the weekends"     History  Drug Use  . Types: Cocaine    Comment: 12/21/2014 "last cocaine was ~ 2 months ago"    Social History   Social History  . Marital status: Single    Spouse name: N/A  . Number of children: N/A  . Years of education: N/A   Social History Main Topics  . Smoking status: Current Every Day Smoker    Packs/day: 1.00    Years: 32.00    Types: Cigarettes  . Smokeless tobacco: Never Used  . Alcohol use 32.4 oz/week    54 Cans of beer per week     Comment: 12/21/2014 "2-3, 24oz  beers per night during work nights, 6 24oz beer per day on the weekends"  .  Drug use:     Types: Cocaine     Comment: 12/21/2014 "last cocaine was ~ 2 months ago"  . Sexual activity: Yes   Other Topics Concern  . None   Social History Narrative  . None   Additional Social History:    History of alcohol / drug use?: Yes Longest period of sobriety (when/how long): 1-2 days Negative Consequences of Use: Financial, Personal relationships, Work / Youth worker Withdrawal Symptoms:  (None Repoted) Name of Substance 1: Alcohol 1 - Age of First Use: 16 1 - Amount (size/oz): pt reports using as much as he can  1 - Frequency: Daily 1 - Duration: For the past 10 years  1 - Last Use / Amount: Last night patient  does not remember how much he drank  Sleep: Fair  Appetite:  Good  Current Medications: Current Facility-Administered Medications  Medication Dose Route Frequency Provider Last Rate Last Dose  . acetaminophen (TYLENOL) tablet 650 mg  650 mg Oral Q6H PRN Patrecia Pour, NP   650 mg at 06/30/16 1050  . alum & mag hydroxide-simeth (MAALOX/MYLANTA) 200-200-20 MG/5ML suspension 30 mL  30 mL Oral Q4H PRN Patrecia Pour, NP      . apixaban (ELIQUIS) tablet 5 mg  5 mg Oral BID Patrecia Pour, NP   5 mg at 06/30/16 0801  . atorvastatin (LIPITOR) tablet 10 mg  10 mg Oral q1800 Patrecia Pour, NP   10 mg at 06/29/16 1702  . diltiazem (CARDIZEM CD) 24 hr capsule 180 mg  180 mg Oral Daily Patrecia Pour, NP   180 mg at 06/30/16 0801  . hydrOXYzine (ATARAX/VISTARIL) tablet 25 mg  25 mg Oral Q6H PRN Jenne Campus, MD      . loperamide (IMODIUM) capsule 2-4 mg  2-4 mg Oral PRN Jenne Campus, MD      . LORazepam (ATIVAN) tablet 1 mg  1 mg Oral Q6H PRN Myer Peer Cobos, MD      . magnesium hydroxide (MILK OF MAGNESIA) suspension 30 mL  30 mL Oral Daily PRN Patrecia Pour, NP      . metoprolol tartrate (LOPRESSOR) tablet 25 mg  25 mg Oral BID Patrecia Pour, NP   25 mg at 06/30/16 0801  . multivitamin with minerals tablet 1 tablet  1 tablet Oral Daily Jenne Campus, MD   1 tablet at 06/30/16 0801  . nicotine (NICODERM CQ - dosed in mg/24 hours) patch 21 mg  21 mg Transdermal Daily Rozetta Nunnery, NP   21 mg at 06/30/16 0800  . ondansetron (ZOFRAN-ODT) disintegrating tablet 4 mg  4 mg Oral Q6H PRN Jenne Campus, MD      . Oxcarbazepine (TRILEPTAL) tablet 300 mg  300 mg Oral BID Patrecia Pour, NP   300 mg at 06/30/16 0801  . risperiDONE (RISPERDAL) tablet 1 mg  1 mg Oral BID Patrecia Pour, NP   1 mg at 06/30/16 0801  . thiamine (VITAMIN B-1) tablet 100 mg  100 mg Oral Daily Jenne Campus, MD   100 mg at 06/30/16 0800    Lab Results:  Results for orders placed or performed during the hospital  encounter of 06/28/16 (from the past 48 hour(s))  Lipid panel     Status: None   Collection Time: 06/30/16  6:22 AM  Result Value Ref Range   Cholesterol 169 0 - 200 mg/dL   Triglycerides 83 <150 mg/dL   HDL 62 >40  mg/dL   Total CHOL/HDL Ratio 2.7 RATIO   VLDL 17 0 - 40 mg/dL   LDL Cholesterol 90 0 - 99 mg/dL    Comment:        Total Cholesterol/HDL:CHD Risk Coronary Heart Disease Risk Table                     Men   Women  1/2 Average Risk   3.4   3.3  Average Risk       5.0   4.4  2 X Average Risk   9.6   7.1  3 X Average Risk  23.4   11.0        Use the calculated Patient Ratio above and the CHD Risk Table to determine the patient's CHD Risk.        ATP III CLASSIFICATION (LDL):  <100     mg/dL   Optimal  100-129  mg/dL   Near or Above                    Optimal  130-159  mg/dL   Borderline  160-189  mg/dL   High  >190     mg/dL   Very High Performed at [facility]   TSH     Status: None   Collection Time: 06/30/16  6:22 AM  Result Value Ref Range   TSH 2.104 0.350 - 4.500 uIU/mL    Comment: Performed by a 3rd Generation assay with a functional sensitivity of <=0.01 uIU/mL. Performed at [facility]     Blood Alcohol level:  Lab Results  Component Value Date   [facility] <5 06/28/2016   ETH <5 26/71/2458    Metabolic Disorder Labs: Lab Results  Component Value Date   HGBA1C 6.2 (H) 12/22/2014   MPG 131 12/22/2014   MPG 128 (H) 06/26/2010   No results found for: PROLACTIN Lab Results  Component Value Date   CHOL 169 06/30/2016   TRIG 83 06/30/2016   HDL 62 06/30/2016   CHOLHDL 2.7 06/30/2016   VLDL 17 06/30/2016   LDLCALC 90 06/30/2016   LDLCALC 90 12/22/2014    Physical Findings: AIMS: Facial and Oral Movements Muscles of Facial Expression: None, normal Lips and Perioral Area: None, normal Jaw: None, normal Tongue: None, normal,Extremity Movements Upper (arms, wrists, hands, fingers): None, normal Lower (legs, knees,  ankles, toes): None, normal, Trunk Movements Neck, shoulders, hips: None, normal, Overall Severity Severity of abnormal movements (highest score from questions above): None, normal Incapacitation due to abnormal movements: None, normal Patient's awareness of abnormal movements (rate only patient's report): No Awareness, Dental Status Current problems with teeth and/or dentures?: No Does patient usually wear dentures?: No  CIWA:  CIWA-Ar Total: 0 COWS:     Musculoskeletal: Strength & Muscle Tone: within normal limits Gait & Station: normal Patient leans: N/A  Psychiatric Specialty Exam: Physical Exam  ROS denies chest pain, denies shortness of breath, no nausea or vomiting   Blood pressure (!) 185/89, pulse 71, temperature 98.7 F (37.1 C), resp. rate 18, height 6' 5.5" (1.969 m), weight 133.4 kg (294 lb).Body mass index is 34.41 kg/m.  General Appearance: Fairly Groomed  Eye Contact:  Good  Speech:  Normal Rate- not pressured or loud at this time  Volume:  Normal  Mood:  states he is feeling better, more " mellow"  Affect:  improving, less expansive, less irritable   Thought Process:  Linear  Orientation:  Full (Time, Place,  and Person)  Thought Content:  denies hallucinations, no delusions, not internally preoccupied   Suicidal Thoughts:  No- at this time denies suicidal or self injurious ideations, denies any homicidal or violent ideations, contracts for safety on unit   Homicidal Thoughts:  No  Memory:  recent and remote grossly intact   Judgement:  Other:  improving   Insight:  improving   Psychomotor Activity:  Normal  Concentration:  Concentration: Good and Attention Span: Good  Recall:  Good  Fund of Knowledge:  Good  Language:  Good  Akathisia:  Negative  Handed:  Right  AIMS (if indicated):     Assets:  Desire for Improvement Resilience  ADL's:  Intact  Cognition:  WNL  Sleep:  Number of Hours: 5   Assessment - patient is presenting with improvement compared  to admission, less irritable, reports decrease in racing thoughts , decrease in angry outbursts.Currently denying SI or HI.  Nursing staff reports indicate that patient improved but still labile and irritable at times, but no overtly disruptive behaviors on unit and redirectable.  Denies medication side effects  Treatment Plan Summary: Daily contact with patient to assess and evaluate symptoms and progress in treatment, Medication management, Plan inpatient admission  and medications as below Encourage group and milieu participation to work on coping skills and symptom reduction  Continue Vistaril 25 mgrs Q 6 hours PRN for anxiety  Continue Trileptal 300 mgrs BID for mood disorder Continue Risperidone 1 mgr BID for mood disorder Continue Ativan PRNs as per CIWA protocol for potential alcohol withdrawal symptoms Treatment team working on disposition planning   Neita Garnet, MD 06/30/2016, 3:09 PM

## 2016-07-01 ENCOUNTER — Other Ambulatory Visit: Payer: Self-pay

## 2016-07-01 DIAGNOSIS — I10 Essential (primary) hypertension: Secondary | ICD-10-CM

## 2016-07-01 LAB — HEMOGLOBIN A1C
Hgb A1c MFr Bld: 5.5 % (ref 4.8–5.6)
MEAN PLASMA GLUCOSE: 111 mg/dL

## 2016-07-01 LAB — PROLACTIN: PROLACTIN: 15.6 ng/mL — AB (ref 4.0–15.2)

## 2016-07-01 MED ORDER — HYDRALAZINE HCL 25 MG PO TABS
25.0000 mg | ORAL_TABLET | Freq: Four times a day (QID) | ORAL | Status: DC | PRN
  Administered 2016-07-01 – 2016-07-03 (×3): 25 mg via ORAL
  Filled 2016-07-01 (×3): qty 1

## 2016-07-01 MED ORDER — LISINOPRIL 20 MG PO TABS
40.0000 mg | ORAL_TABLET | Freq: Every day | ORAL | Status: DC
  Administered 2016-07-01 – 2016-07-03 (×3): 40 mg via ORAL
  Filled 2016-07-01: qty 1
  Filled 2016-07-01: qty 2
  Filled 2016-07-01: qty 1
  Filled 2016-07-01: qty 28
  Filled 2016-07-01 (×2): qty 1

## 2016-07-01 NOTE — [facility] Group Notes (Signed)
[facility] Group Notes:  (Clinical Social Work)  07/01/2016  10:00-11:00AM  Summary of Progress/Problems:   The main focus of today's process group was to   1)  demonstrate the importance of adding supports  2)  discuss 4 definitions of support  3)  identify the patient's current level of healthy support and   4)  elicit commitments to add one healthy support  An emphasis was placed on using counselor, doctor, therapy groups, 12-step groups, and problem-specific support groups to expand supports.    The patient expressed full comprehension of the concepts presented, and agreed that there is a need to add more supports.  The patient stated his current level of support is "none to good" and explained that people he had thought would support him did not, while people he did not expect to support him did.  He was quite talkative throughout group, not always on topic, but could be redirected with some effort.  Type of Therapy:  Process Group  Participation Level:  Active  Participation Quality:  Attentive, Monopolizing and Redirectable  Affect:  Blunted  Cognitive:  Appropriate and Oriented  Insight:  Developing/Improving  Engagement in Therapy:  Engaged  Modes of Intervention:   Education, Support and Processing, Activity  Ambrose MantleMareida Grossman-Orr, LCSW 07/01/2016  12:19 PM

## 2016-07-01 NOTE — Plan of Care (Signed)
Problem: Coping: Goal: Ability to demonstrate self-control will improve Outcome: Progressing Patient has been irritable at times, however, he has maintained self control.

## 2016-07-01 NOTE — Progress Notes (Signed)
D: Pt denies SI/HI/AVH. Pt is pleasant and cooperative. Pt goal for today is to on his anxiety. A: Pt was offered support and encouragement.  Pt was encourage to attend groups. Q 15 minute checks were done for safety.  R:Pt attends groups and interacts well with peers and staff.  Pt has no complaints.Pt receptive to treatment and safety maintained on unit.

## 2016-07-01 NOTE — Progress Notes (Signed)
D: Patient has been pleasant with a slight edge of irritability.  He reports decreased depressive symptoms since admission and denies any thoughts of self harm.  Patient is observed in day room talking with his peers.  Patient usually leads the conversation among his peers.  His blood pressure has been elevated.  Orders placed by hospitalist and prn hydralazine given.  Patient can be irritable at times and talks inappropriately.  His affect is bright.  Observed him cursing in the hallway and asked if he was okay.  He stated, "it's not you."  He rates his depression and anxiety as a 5; hopelessness as a 4.  His goal today is "me."  His sleep is fair; appetite good; energy is normal and concentration is poor.   A: Continue to monitor medication management and MD orders.  Safety checks completed every 15 minutes per protocol.  Offer support and encouragement as needed. R: Patient is receptive to staff; he is redirectable and his behavior is mostly appropriate.

## 2016-07-01 NOTE — Progress Notes (Signed)
Patient intermittently complaining of chest pain.  He describes the pain as a "burning sensation."  Notified MD and vitals were taken, along with EKG.  BP has decreased since this morning 164/85. Also EKG completed and placed on shadow chart.  Patient states pain does not last long.  No additional orders from MD.

## 2016-07-01 NOTE — [facility] Group Notes (Signed)
Pt rated his day a 7 out of 10. Pt goal for tomorrow is have a positive influence on others. Pt will also like to talk with the doctor tomorrow about a dosage change with one of his medications.

## 2016-07-01 NOTE — Progress Notes (Signed)
D: Pt denies SI/HI/AVH. Pt is pleasant and cooperative. Pt goal for today is to work on his anxiety and coping techniques. A: Pt was offered support and encouragement.  Pt was encourage to attend groups. Q 15 minute checks were done for safety.  R:Pt attends groups and interacts well with peers and staff. Pt has no complaints.Pt receptive to treatment and safety maintained on unit.

## 2016-07-01 NOTE — Progress Notes (Signed)
[facility] MD Progress Note  07/01/2016 12:02 PM Gary Buck  MRN:  [id number] Subjective:  Patient reports partial improvement compared to admission- he feels less irritable. At this time he is focusing more on disposition planning- states he does not want to return to prior living situation . At this time denies medication side effects.  Objective : I have discussed case with RN staff and have met with patient. Patient has continued to improve partially compared to admission - presents calmer, speech is not pressured or loud, and affect is less expansive, and not irritable at this time.  BP has remained elevated in spite of current antihypertensive medication management - have consulted hospitalist regarding antihypertensive medication adjustments . Patient is visible on unit, interactive in milieu, pleasant on approach. As noted, he is focusing on disposition planning - patient is currently homeless. He is expressing interest in option such as [employer].   Principal Problem: Bipolar affective disorder, current episode mixed, without psychotic features ([place]) Diagnosis:   Patient Active Problem List   Diagnosis Date Noted  . Bipolar affective disorder, mixed, severe ([place]) [F31.63] 06/28/2016  . Bipolar affective disorder, current episode mixed, without psychotic features ([place]) [F31.60] 06/28/2016  . Atrial fibrillation, unspecified [I48.91]   . Elevated troponin [R74.8]   . Atrial fibrillation with RVR ([place]) [I48.91] 12/21/2014  . CAD (coronary artery disease) [I25.10] 12/21/2014  . Chest pain [R07.9]   . Essential hypertension [I10]   . Remote history of stroke [Z86.73]   . Polysubstance abuse [F19.10]    Total Time spent with patient: 20 minutes  Past Medical History:  Past Medical History:  Diagnosis Date  . Anginal pain ([place])   . Bipolar disorder ([place])   . Depression   . ETOH abuse   . H/O cocaine abuse   . Hypercholesterolemia   . Hypertension   . PAF (paroxysmal  atrial fibrillation) ([place])    Gary Buck 12/21/2014  . Stroke [facility]) 2004   pt sts about 12 years ago.   . Tobacco abuse   . Type II diabetes mellitus ([place]) dx'd 12/21/2014    Past Surgical History:  Procedure Laterality Date  . CYSTECTOMY  ~ 2004   "on the top of my forehead"  . INGUINAL HERNIA REPAIR Left ~ 2012   Family History:  Family History  Problem Relation Age of Onset  . Diabetes Mellitus II Mother     Social History:  History  Alcohol Use  . 32.4 oz/week  . 40 Cans of beer per week    Comment: 12/21/2014 "2-3, 24oz  beers per night during work nights, 6 24oz beer per day on the weekends"     History  Drug Use  . Types: Cocaine    Comment: 12/21/2014 "last cocaine was ~ 2 months ago"    Social History   Social History  . Marital status: Single    Spouse name: N/A  . Number of children: N/A  . Years of education: N/A   Social History Main Topics  . Smoking status: Current Every Day Smoker    Packs/day: 1.00    Years: 32.00    Types: Cigarettes  . Smokeless tobacco: Never Used  . Alcohol use 32.4 oz/week    54 Cans of beer per week     Comment: 12/21/2014 "2-3, 24oz  beers per night during work nights, 6 24oz beer per day on the weekends"  . Drug use:     Types: Cocaine     Comment: 12/21/2014 "last  cocaine was ~ 2 months ago"  . Sexual activity: Yes   Other Topics Concern  . None   Social History Narrative  . None   Additional Social History:    History of alcohol / drug use?: Yes Longest period of sobriety (when/how long): 1-2 days Negative Consequences of Use: Financial, Personal relationships, Work / Youth worker Withdrawal Symptoms:  (None Repoted) Name of Substance 1: Alcohol 1 - Age of First Use: 16 1 - Amount (size/oz): pt reports using as much as he can  1 - Frequency: Daily 1 - Duration: For the past 10 years  1 - Last Use / Amount: Last night patient does not remember how much he drank  Sleep:  Improved   Appetite:  Good  Current  Medications: Current Facility-Administered Medications  Medication Dose Route Frequency Provider Last Rate Last Dose  . acetaminophen (TYLENOL) tablet 650 mg  650 mg Oral Q6H PRN Patrecia Pour, NP   650 mg at 06/30/16 1050  . alum & mag hydroxide-simeth (MAALOX/MYLANTA) 200-200-20 MG/5ML suspension 30 mL  30 mL Oral Q4H PRN Patrecia Pour, NP      . apixaban (ELIQUIS) tablet 5 mg  5 mg Oral BID Patrecia Pour, NP   5 mg at 07/01/16 0809  . atorvastatin (LIPITOR) tablet 10 mg  10 mg Oral q1800 Patrecia Pour, NP   10 mg at 06/30/16 1822  . diltiazem (CARDIZEM CD) 24 hr capsule 180 mg  180 mg Oral Daily Patrecia Pour, NP   180 mg at 07/01/16 0809  . hydrALAZINE (APRESOLINE) tablet 25 mg  25 mg Oral Q6H PRN Barton Dubois, MD   25 mg at 07/01/16 1103  . hydrOXYzine (ATARAX/VISTARIL) tablet 25 mg  25 mg Oral Q6H PRN Jenne Campus, MD      . lisinopril (PRINIVIL,ZESTRIL) tablet 40 mg  40 mg Oral Daily Barton Dubois, MD   40 mg at 07/01/16 1103  . loperamide (IMODIUM) capsule 2-4 mg  2-4 mg Oral PRN Jenne Campus, MD      . LORazepam (ATIVAN) tablet 1 mg  1 mg Oral Q6H PRN Myer Peer Cobos, MD      . magnesium hydroxide (MILK OF MAGNESIA) suspension 30 mL  30 mL Oral Daily PRN Patrecia Pour, NP      . metoprolol tartrate (LOPRESSOR) tablet 25 mg  25 mg Oral BID Patrecia Pour, NP   25 mg at 07/01/16 0809  . multivitamin with minerals tablet 1 tablet  1 tablet Oral Daily Jenne Campus, MD   1 tablet at 07/01/16 0809  . nicotine (NICODERM CQ - dosed in mg/24 hours) patch 21 mg  21 mg Transdermal Daily Rozetta Nunnery, NP   21 mg at 07/01/16 0839  . ondansetron (ZOFRAN-ODT) disintegrating tablet 4 mg  4 mg Oral Q6H PRN Jenne Campus, MD      . Oxcarbazepine (TRILEPTAL) tablet 300 mg  300 mg Oral BID Patrecia Pour, NP   300 mg at 07/01/16 0809  . risperiDONE (RISPERDAL) tablet 1 mg  1 mg Oral BID Patrecia Pour, NP   1 mg at 07/01/16 0809  . thiamine (VITAMIN B-1) tablet 100 mg  100 mg Oral  Daily Jenne Campus, MD   100 mg at 07/01/16 8315    Lab Results:  Results for orders placed or performed during the Buck encounter of 06/28/16 (from the past 48 hour(s))  Lipid panel     Status: None  Collection Time: 06/30/16  6:22 AM  Result Value Ref Range   Cholesterol 169 0 - 200 mg/dL   Triglycerides 83 <150 mg/dL   HDL 62 >40 mg/dL   Total CHOL/HDL Ratio 2.7 RATIO   VLDL 17 0 - 40 mg/dL   LDL Cholesterol 90 0 - 99 mg/dL    Comment:        Total Cholesterol/HDL:CHD Risk Coronary Heart Disease Risk Table                     Men   Women  1/2 Average Risk   3.4   3.3  Average Risk       5.0   4.4  2 X Average Risk   9.6   7.1  3 X Average Risk  23.4   11.0        Use the calculated Patient Ratio above and the CHD Risk Table to determine the patient's CHD Risk.        ATP III CLASSIFICATION (LDL):  <100     mg/dL   Optimal  100-129  mg/dL   Near or Above                    Optimal  130-159  mg/dL   Borderline  160-189  mg/dL   High  >190     mg/dL   Very High Performed at [facility]   Prolactin     Status: Abnormal   Collection Time: 06/30/16  6:22 AM  Result Value Ref Range   Prolactin 15.6 (H) 4.0 - 15.2 ng/mL    Comment: (NOTE) Performed At: [facility] [place], [place] [id number] Lindon Romp MD [id number] Performed at [facility]   TSH     Status: None   Collection Time: 06/30/16  6:22 AM  Result Value Ref Range   TSH 2.104 0.350 - 4.500 uIU/mL    Comment: Performed by a 3rd Generation assay with a functional sensitivity of <=0.01 uIU/mL. Performed at [facility]     Blood Alcohol level:  Lab Results  Component Value Date   [facility] <5 06/28/2016   ETH <5 46/80/3212    Metabolic Disorder Labs: Lab Results  Component Value Date   HGBA1C 6.2 (H) 12/22/2014   MPG 131 12/22/2014   MPG 128 (H) 06/26/2010   Lab Results  Component Value Date   PROLACTIN 15.6 (H)  06/30/2016   Lab Results  Component Value Date   CHOL 169 06/30/2016   TRIG 83 06/30/2016   HDL 62 06/30/2016   CHOLHDL 2.7 06/30/2016   VLDL 17 06/30/2016   LDLCALC 90 06/30/2016   LDLCALC 90 12/22/2014    Physical Findings: AIMS: Facial and Oral Movements Muscles of Facial Expression: None, normal Lips and Perioral Area: None, normal Jaw: None, normal Tongue: None, normal,Extremity Movements Upper (arms, wrists, hands, fingers): None, normal Lower (legs, knees, ankles, toes): None, normal, Trunk Movements Neck, shoulders, hips: None, normal, Overall Severity Severity of abnormal movements (highest score from questions above): None, normal Incapacitation due to abnormal movements: None, normal Patient's awareness of abnormal movements (rate only patient's report): No Awareness, Dental Status Current problems with teeth and/or dentures?: No Does patient usually wear dentures?: No  CIWA:  CIWA-Ar Total: 2 COWS:     Musculoskeletal: Strength & Muscle Tone: within normal limits Gait & Station: normal Patient leans: N/A  Psychiatric Specialty Exam: Physical Exam  ROS denies chest  pain, denies shortness of breath, no nausea or vomiting   Blood pressure (!) 194/96, pulse 76, temperature 98.7 F (37.1 C), resp. rate 18, height 6' 5.5" (1.969 m), weight 133.4 kg (294 lb).Body mass index is 34.41 kg/m.  General Appearance: improving grooming   Eye Contact:  Good  Speech:  Improved, less pressured   Volume:  Normal  Mood:   Gradually improving , less irritable  Affect:  Improving , more reactive   Thought Process:  Linear  Orientation:  Full (Time, Place, and Person)  Thought Content:  denies hallucinations, no delusions, not internally preoccupied   Suicidal Thoughts:  No- at this time denies suicidal or self injurious ideations, denies any homicidal or violent ideations, contracts for safety on unit   Homicidal Thoughts:  No  Memory:  recent and remote grossly intact    Judgement:  Other:  improving   Insight:  improving   Psychomotor Activity:  Normal  Concentration:  Concentration: Good and Attention Span: Good  Recall:  Good  Fund of Knowledge:  Good  Language:  Good  Akathisia:  Negative  Handed:  Right  AIMS (if indicated):     Assets:  Desire for Improvement Resilience  ADL's:  Intact  Cognition:  WNL  Sleep:  Number of Hours: 6.25   Assessment patient continues to gradually improve, and presents calmer, less irritable, less expansive, less pressured than on admission. He is sleeping better as well. At this time tolerating medications well , denies medication side effects. Patient has history of HTN and BP has remained elevated- appreciate hospitalist recommendations to optimize antihypertensive treatment. As patient improves he is becoming more future oriented and at this time expressing interest in option such as [facility].   Treatment Plan Summary: Daily contact with patient to assess and evaluate symptoms and progress in treatment, Medication management, Plan inpatient admission  and medications as below Encourage group and milieu participation to work on coping skills and symptom reduction  Continue Vistaril 25 mgrs Q 6 hours PRN for anxiety  Continue Trileptal 300 mgrs BID for mood disorder Continue Risperidone 1 mgr BID for mood disorder Continue Ativan PRNs as per CIWA protocol for potential alcohol withdrawal symptoms Treatment team working on disposition planning   Neita Garnet, MD 07/01/2016, 12:02 PMPatient ID: Gary Buck, male   DOB: 06-21-1965, 51 y.o.   MRN: [id number]

## 2016-07-01 NOTE — [facility] Group Notes (Signed)
Healthy Support Systems  Date:  07/01/2016  Time:  0930  Type of Therapy:  Nurse Education  Healthy Support Systems:  The group is focused around teaching patients how to develop healthy support systems and the importance of utilizing these supports for maintaining homeostasis in our lives.  Participation Level:  Active  Participation Quality:  Monopolizing  Affect:  Anxious  Cognitive:  Alert  Insight:  Limited  Engagement in Group:  Distracting  Modes of Intervention:  Education  Summary of Progress/Problems:  Rich BraveDuke, Ryman Rathgeber Lynn 07/01/2016, 12:31 PM

## 2016-07-01 NOTE — [facility] Group Notes (Signed)
Healthy Coping SKills   Date:  07/01/2016  Time:  1100  Type of Therapy:  Nurse Education  /  Helathy Coping SKills :  The group focuses on teaching patients how to develop healthy coping skills and how to utilize these and how they can positively impact their lives.  Participation Level:  Active  Participation Quality:  Monopolizing  Affect:  Defensive  Cognitive:  Disorganized  Insight:  Improving  Engagement in Group:  Distracting  Modes of Intervention:  Education  Summary of Progress/Problems:  Rich BraveDuke, Idell Hissong Lynn 07/01/2016, 2:32 PM

## 2016-07-01 NOTE — Progress Notes (Signed)
Contacted by Psychiatry service for recommendations in initiation of further antihypertensive regimen for Mr. Gary Buck. Patient asymptomatic otherwise and w/o signs of secondary effects from elevated BP currently. He is on cardizem and metoprolol, mainly for control of atrial fibrillation. Recommended to start lisinopril 40mg  daily and will also recommend useof hydralazine 25mg  Q6H PRN for SBP > 185 and/or DBP > 110. Recheck BMET in am and make sure patient follow heart healthy diet (sodium intake < 2.5 gram daily).  Vassie LollMadera, Gary Buck [phone]

## 2016-07-02 DIAGNOSIS — F3160 Bipolar disorder, current episode mixed, unspecified: Secondary | ICD-10-CM

## 2016-07-02 DIAGNOSIS — F17210 Nicotine dependence, cigarettes, uncomplicated: Secondary | ICD-10-CM

## 2016-07-02 DIAGNOSIS — Z79899 Other long term (current) drug therapy: Secondary | ICD-10-CM

## 2016-07-02 DIAGNOSIS — Z9889 Other specified postprocedural states: Secondary | ICD-10-CM

## 2016-07-02 DIAGNOSIS — Z833 Family history of diabetes mellitus: Secondary | ICD-10-CM

## 2016-07-02 LAB — BASIC METABOLIC PANEL
ANION GAP: 10 (ref 5–15)
BUN: 14 mg/dL (ref 6–20)
CHLORIDE: 102 mmol/L (ref 101–111)
CO2: 25 mmol/L (ref 22–32)
Calcium: 8.9 mg/dL (ref 8.9–10.3)
Creatinine, Ser: 0.71 mg/dL (ref 0.61–1.24)
Glucose, Bld: 183 mg/dL — ABNORMAL HIGH (ref 65–99)
POTASSIUM: 3.8 mmol/L (ref 3.5–5.1)
SODIUM: 137 mmol/L (ref 135–145)

## 2016-07-02 NOTE — Progress Notes (Cosign Needed)
Adult Psychoeducational Group Note  Date:  07/02/2016 Time:  11:44 AM  Group Topic/Focus:  Coping With Mental Health Crisis:   The purpose of this group is to help patients identify strategies for coping with mental health crisis.  Group discusses possible causes of crisis and ways to manage them effectively.   Participation Level:  Active  Participation Quality:  Appropriate and Attentive  Affect:  Appropriate  Cognitive:  Alert and Appropriate  Insight: Appropriate  Engagement in Group:  Engaged  Modes of Intervention:  Discussion  Additional Comments:  Pt did participate in group this morning, Pt sates that he is bi-polar and was self medicating with cocain and molly.  Pt admits to major anger issues.  Pt states that he is trying to get into a treatment center. [name] 07/02/2016, 11:44 AM

## 2016-07-02 NOTE — [facility] Group Notes (Signed)
[facility] LCSW Group Therapy  07/02/2016 1:15pm  Type of Therapy:  Group Therapy vercoming Obstacles  Participation Level:  Minimal- slept through part of group  Participation Quality:  Off-topic  Affect:  Appropriate  Cognitive:  Appropriate and Oriented  Insight:  Unable to assess  Engagement in Therapy:  Improving  Modes of Intervention:  Discussion, Exploration, Problem-solving and Support  Description of Group:   In this group patients will be encouraged to explore what they see as obstacles to their own wellness and recovery. They will be guided to discuss their thoughts, feelings, and behaviors related to these obstacles. The group will process together ways to cope with barriers, with attention given to specific choices patients can make. Each patient will be challenged to identify changes they are motivated to make in order to overcome their obstacles. This group will be process-oriented, with patients participating in exploration of their own experiences as well as giving and receiving support and challenge from other group members.   Therapeutic Modalities:   Cognitive Behavioral Therapy Solution Focused Therapy Motivational Interviewing Relapse Prevention Therapy   Vernie ShanksLauren Gerrie Castiglia, LCSW 07/02/2016 6:05 PM

## 2016-07-02 NOTE — Progress Notes (Signed)
Recreation Therapy Notes  Date: 07/02/16 Time: 0930 Location: 300 [address] Dayroom  Group Topic: Stress Management  Goal Area(s) Addresses:  Patient will verbalize importance of using healthy stress management.  Patient will identify positive emotions associated with healthy stress management.   Intervention: Stress Management  Activity :  Peaceful Waves Guided Imagery.  LRT introduced the stress management concept of guided imagery.  LRT read a script to allow patients the opportunity engage and participate in the activity.  Patients were to follow along as LRT read script to participate in the activity.  Education:  Stress Management, Discharge Planning.   Education Outcome: Acknowledges edcuation/In group clarification offered/Needs additional education  Clinical Observations/Feedback: Pt did not attend group.   Caroll RancherMarjette Arda Keadle, LRT/CTRS         Caroll RancherLindsay, Saket Hellstrom A 07/02/2016 12:19 PM

## 2016-07-02 NOTE — Plan of Care (Signed)
Problem: Education: Goal: Utilization of techniques to improve thought processes will improve Outcome: Progressing Nurse discussed depression/coping skills with patient.    

## 2016-07-02 NOTE — Progress Notes (Signed)
Per patient's request, patient was given list and information on each of patient's scheduled medications.

## 2016-07-02 NOTE — Progress Notes (Signed)
D:  Patient's self inventory sheet, patient has poor sleep, no sleep medication given.  Good appetite, normal energy level, poor concentration.  Rated depression, hopeless and anxiety 6.5.  Denied withdrawals.  Denied SI.  Denied physical problems.  Denied pain.  Goal is "ME".  Plan to "go forward".  Does have discharge plans. A:  Medications administered per MD orders.  Emotional support and encouragement given patient. R:  Medications administered per MD orders.  Safety maintained with 15 minute checks.

## 2016-07-02 NOTE — Progress Notes (Signed)
[facility] MD Progress Note  07/02/2016 2:05 PM Gary Buck  MRN:  [id number]  Subjective: Gary Buck reports, "I'm doing alright, I just don't feel good or bad. I'm just here, not to say I feel numb. I sleep very poorly, but will decline any sleep medicine because I don't feel like I need sleep medicine. Yes, I do hear voices sometime. At this time he is focusing more on disposition planning- states he does not want to return to prior living situation. At this time denies any medication side effects.  Objective : I have discussed case with RN staff and have met with patient. Patient has continued to improve partially compared to admission - presents calmer, speech is not pressured or loud, and affect is less expansive, and not irritable at this time.  BP has remained elevated in spite of current antihypertensive medication management - have consulted hospitalist regarding antihypertensive medication adjustments; see consult notes of 06-29-16. Patient is visible on unit, interactive in milieu, pleasant on approach. As noted, he is focusing on disposition planning - patient is currently homeless. He is expressing interest in option such as [employer].   Principal Problem: Bipolar affective disorder, current episode mixed, without psychotic features ([place]) Diagnosis:   Patient Active Problem List   Diagnosis Date Noted  . Bipolar affective disorder, mixed, severe ([place]) [F31.63] 06/28/2016  . Bipolar affective disorder, current episode mixed, without psychotic features ([place]) [F31.60] 06/28/2016  . Atrial fibrillation, unspecified [I48.91]   . Elevated troponin [R74.8]   . Atrial fibrillation with RVR ([place]) [I48.91] 12/21/2014  . CAD (coronary artery disease) [I25.10] 12/21/2014  . Chest pain [R07.9]   . Essential hypertension [I10]   . Remote history of stroke [Z86.73]   . Polysubstance abuse [F19.10]    Total Time spent with patient: 15 minutes  Past Medical History:  Past Medical  History:  Diagnosis Date  . Anginal pain ([place])   . Bipolar disorder ([place])   . Depression   . ETOH abuse   . H/O cocaine abuse   . Hypercholesterolemia   . Hypertension   . PAF (paroxysmal atrial fibrillation) ([place])    Archie Endo 12/21/2014  . Stroke [facility]) 2004   pt sts about 12 years ago.   . Tobacco abuse   . Type II diabetes mellitus ([place]) dx'd 12/21/2014    Past Surgical History:  Procedure Laterality Date  . CYSTECTOMY  ~ 2004   "on the top of my forehead"  . INGUINAL HERNIA REPAIR Left ~ 2012   Family History:  Family History  Problem Relation Age of Onset  . Diabetes Mellitus II Mother    Social History:  History  Alcohol Use  . 32.4 oz/week  . 86 Cans of beer per week    Comment: 12/21/2014 "2-3, 24oz  beers per night during work nights, 6 24oz beer per day on the weekends"     History  Drug Use  . Types: Cocaine    Comment: 12/21/2014 "last cocaine was ~ 2 months ago"    Social History   Social History  . Marital status: Single    Spouse name: N/A  . Number of children: N/A  . Years of education: N/A   Social History Main Topics  . Smoking status: Current Every Day Smoker    Packs/day: 1.00    Years: 32.00    Types: Cigarettes  . Smokeless tobacco: Never Used  . Alcohol use 32.4 oz/week    54 Cans of beer per week  Comment: 12/21/2014 "2-3, 24oz  beers per night during work nights, 6 24oz beer per day on the weekends"  . Drug use:     Types: Cocaine     Comment: 12/21/2014 "last cocaine was ~ 2 months ago"  . Sexual activity: Yes   Other Topics Concern  . None   Social History Narrative  . None   Additional Social History:    History of alcohol / drug use?: Yes Longest period of sobriety (when/how long): 1-2 days Negative Consequences of Use: Financial, Personal relationships, Work / Youth worker Withdrawal Symptoms:  (None Repoted) Name of Substance 1: Alcohol 1 - Age of First Use: 16 1 - Amount (size/oz): pt reports using as much as he can  1 -  Frequency: Daily 1 - Duration: For the past 10 years  1 - Last Use / Amount: Last night patient does not remember how much he drank  Sleep:  Improved   Appetite:  Good  Current Medications: Current Facility-Administered Medications  Medication Dose Route Frequency Provider Last Rate Last Dose  . acetaminophen (TYLENOL) tablet 650 mg  650 mg Oral Q6H PRN Patrecia Pour, NP   650 mg at 06/30/16 1050  . alum & mag hydroxide-simeth (MAALOX/MYLANTA) 200-200-20 MG/5ML suspension 30 mL  30 mL Oral Q4H PRN Patrecia Pour, NP      . apixaban (ELIQUIS) tablet 5 mg  5 mg Oral BID Patrecia Pour, NP   5 mg at 07/02/16 0809  . atorvastatin (LIPITOR) tablet 10 mg  10 mg Oral q1800 Patrecia Pour, NP   10 mg at 07/01/16 1715  . diltiazem (CARDIZEM CD) 24 hr capsule 180 mg  180 mg Oral Daily Patrecia Pour, NP   180 mg at 07/02/16 0810  . hydrALAZINE (APRESOLINE) tablet 25 mg  25 mg Oral Q6H PRN Barton Dubois, MD   25 mg at 07/01/16 1103  . hydrOXYzine (ATARAX/VISTARIL) tablet 25 mg  25 mg Oral Q6H PRN Jenne Campus, MD      . lisinopril (PRINIVIL,ZESTRIL) tablet 40 mg  40 mg Oral Daily Barton Dubois, MD   40 mg at 07/02/16 0810  . loperamide (IMODIUM) capsule 2-4 mg  2-4 mg Oral PRN Jenne Campus, MD      . LORazepam (ATIVAN) tablet 1 mg  1 mg Oral Q6H PRN Myer Peer Elmon Shader, MD      . magnesium hydroxide (MILK OF MAGNESIA) suspension 30 mL  30 mL Oral Daily PRN Patrecia Pour, NP      . metoprolol tartrate (LOPRESSOR) tablet 25 mg  25 mg Oral BID Patrecia Pour, NP   25 mg at 07/02/16 0810  . multivitamin with minerals tablet 1 tablet  1 tablet Oral Daily Jenne Campus, MD   1 tablet at 07/02/16 0810  . nicotine (NICODERM CQ - dosed in mg/24 hours) patch 21 mg  21 mg Transdermal Daily Rozetta Nunnery, NP   21 mg at 07/02/16 0811  . ondansetron (ZOFRAN-ODT) disintegrating tablet 4 mg  4 mg Oral Q6H PRN Jenne Campus, MD      . Oxcarbazepine (TRILEPTAL) tablet 300 mg  300 mg Oral BID Patrecia Pour, NP   300 mg at 07/02/16 0811  . risperiDONE (RISPERDAL) tablet 1 mg  1 mg Oral BID Patrecia Pour, NP   1 mg at 07/02/16 0240  . thiamine (VITAMIN B-1) tablet 100 mg  100 mg Oral Daily Jenne Campus, MD   100  mg at 07/02/16 1610   Lab Results:  Results for orders placed or performed during the hospital encounter of 06/28/16 (from the past 48 hour(s))  Basic metabolic panel     Status: Abnormal   Collection Time: 07/02/16  6:23 AM  Result Value Ref Range   Sodium 137 135 - 145 mmol/L   Potassium 3.8 3.5 - 5.1 mmol/L   Chloride 102 101 - 111 mmol/L   CO2 25 22 - 32 mmol/L   Glucose, Bld 183 (H) 65 - 99 mg/dL   BUN 14 6 - 20 mg/dL   Creatinine, Ser 0.71 0.61 - 1.24 mg/dL   Calcium 8.9 8.9 - 10.3 mg/dL   GFR calc non Af Amer >60 >60 mL/min   GFR calc Af Amer >60 >60 mL/min    Comment: (NOTE) The eGFR has been calculated using the CKD EPI equation. This calculation has not been validated in all clinical situations. eGFR's persistently <60 mL/min signify possible Chronic Kidney Disease.    Anion gap 10 5 - 15    Comment: Performed at [facility]   Blood Alcohol level:  Lab Results  Component Value Date   [facility] <5 06/28/2016   ETH <5 96/09/5407   Metabolic Disorder Labs: Lab Results  Component Value Date   HGBA1C 5.5 06/30/2016   MPG 111 06/30/2016   MPG 131 12/22/2014   Lab Results  Component Value Date   PROLACTIN 15.6 (H) 06/30/2016   Lab Results  Component Value Date   CHOL 169 06/30/2016   TRIG 83 06/30/2016   HDL 62 06/30/2016   CHOLHDL 2.7 06/30/2016   VLDL 17 06/30/2016   LDLCALC 90 06/30/2016   LDLCALC 90 12/22/2014   Physical Findings: AIMS: Facial and Oral Movements Muscles of Facial Expression: None, normal Lips and Perioral Area: None, normal Jaw: None, normal Tongue: None, normal,Extremity Movements Upper (arms, wrists, hands, fingers): None, normal Lower (legs, knees, ankles, toes): None, normal, Trunk Movements Neck,  shoulders, hips: None, normal, Overall Severity Severity of abnormal movements (highest score from questions above): None, normal Incapacitation due to abnormal movements: None, normal Patient's awareness of abnormal movements (rate only patient's report): No Awareness, Dental Status Current problems with teeth and/or dentures?: No Does patient usually wear dentures?: No  CIWA:  CIWA-Ar Total: 0 COWS:     Musculoskeletal: Strength & Muscle Tone: within normal limits Gait & Station: normal Patient leans: N/A  Psychiatric Specialty Exam: Physical Exam  ROS denies chest pain, denies shortness of breath, no nausea or vomiting   Blood pressure (!) 166/105, pulse 74, temperature 98 F (36.7 C), temperature source Oral, resp. rate 20, height 6' 5.5" (1.969 m), weight 133.4 kg (294 lb).Body mass index is 34.41 kg/m.  General Appearance: improving grooming   Eye Contact:  Good  Speech:  Improved, less pressured   Volume:  Normal  Mood:   Gradually improving , less irritable  Affect:  Improving , more reactive   Thought Process:  Linear  Orientation:  Full (Time, Place, and Person)  Thought Content:  denies hallucinations, no delusions, not internally preoccupied   Suicidal Thoughts:  No- at this time denies suicidal or self injurious ideations, denies any homicidal or violent ideations, contracts for safety on unit   Homicidal Thoughts:  No  Memory:  recent and remote grossly intact   Judgement:  Other:  improving   Insight:  improving   Psychomotor Activity:  Normal  Concentration:  Concentration: Good and Attention Span: Good  Recall:  Good  Fund of Knowledge:  Good  Language:  Good  Akathisia:  Negative  Handed:  Right  AIMS (if indicated):     Assets:  Desire for Improvement Resilience  ADL's:  Intact  Cognition:  WNL  Sleep:  Number of Hours: 4.5   Assessment: patient continues to gradually improve, and presents calmer, less irritable, less expansive, less pressured than  on admission. He is sleeping better as well. At this time tolerating medications well , denies medication side effects. Patient has history of HTN and BP has remained elevated- appreciate hospitalist recommendations to optimize antihypertensive treatment. As patient improves he is becoming more future oriented and at this time expressing interest in option such as [facility].   Treatment Plan Summary: Daily contact with patient to assess and evaluate symptoms and progress in treatment, Medication management, Plan inpatient admission  and medications as below Encourage group and milieu participation to work on coping skills and symptom reduction  Continue Vistaril 25 mgrs Q 6 hours PRN for anxiety  Continue Trileptal 300 mgrs BID for mood stabilization. Continue Risperidone 1 mgr BID for mood disorder Continue Ativan PRNs as per CIWA protocol for potential alcohol withdrawal symptoms Treatment team working on disposition planning   Encarnacion Slates, NP, PMHNP, FNP-BC 07/02/2016, 2:05 PM  Patient ID: Gary Buck, male   DOB: Jan 04, 1966, 51 y.o.   MRN: [id number] Agree with NP Progress Note

## 2016-07-02 NOTE — Progress Notes (Signed)
Adult Psychoeducational Group Note  Date:  07/02/2016 Time:  9:38 PM  Group Topic/Focus:  Wrap-Up Group:   The focus of this group is to help patients review their daily goal of treatment and discuss progress on daily workbooks.   Participation Level:  Active  Participation Quality:  Appropriate  Affect:  Appropriate  Cognitive:  Appropriate  Insight: Good  Engagement in Group:  Engaged  Modes of Intervention:  Activity  Additional Comments:  Patient rated his day an 8. Goal is to continue to work on self. Natasha MeadKiara M Blaike Vickers 07/02/2016, 9:38 PM

## 2016-07-03 MED ORDER — NICOTINE 21 MG/24HR TD PT24: 21.0000 mg | MEDICATED_PATCH | Freq: Every day | TRANSDERMAL | 0 refills | Status: DC

## 2016-07-03 MED ORDER — METOPROLOL TARTRATE 25 MG PO TABS: 25.0000 mg | ORAL_TABLET | Freq: Two times a day (BID) | ORAL | 11 refills | Status: DC

## 2016-07-03 MED ORDER — THIAMINE HCL 100 MG PO TABS: 100.0000 mg | ORAL_TABLET | Freq: Every day | ORAL | 0 refills | Status: DC

## 2016-07-03 MED ORDER — ATORVASTATIN CALCIUM 10 MG PO TABS: 10.0000 mg | ORAL_TABLET | Freq: Every day | ORAL | 0 refills | Status: DC

## 2016-07-03 MED ORDER — LISINOPRIL 40 MG PO TABS: 40.0000 mg | ORAL_TABLET | Freq: Every day | ORAL | 0 refills | Status: DC

## 2016-07-03 MED ORDER — APIXABAN 5 MG PO TABS: 5.0000 mg | ORAL_TABLET | Freq: Two times a day (BID) | ORAL | 12 refills | Status: DC

## 2016-07-03 MED ORDER — RISPERIDONE 1 MG PO TABS: 1.0000 mg | ORAL_TABLET | Freq: Two times a day (BID) | ORAL | 0 refills | Status: DC

## 2016-07-03 MED ORDER — OXCARBAZEPINE 300 MG PO TABS: 300.0000 mg | ORAL_TABLET | Freq: Two times a day (BID) | ORAL | 0 refills | Status: DC

## 2016-07-03 MED ORDER — DILTIAZEM HCL ER COATED BEADS 180 MG PO CP24: 180.0000 mg | ORAL_CAPSULE | Freq: Every day | ORAL | 0 refills | Status: DC

## 2016-07-03 NOTE — Progress Notes (Signed)
Pt in dayroom speaking with multiple patients and is loud, happy and friendly.  Pt denies pain, discomfort and denies SI, HI or AVH.  Pt jokes that he is paranoid around people.  Pt sts that he has tooth ache and request pain meds. Pt contracts for safety verbally. Pt given PRN pain medication. Pt returns to room to bed.  Pt remains safe on unit. 

## 2016-07-03 NOTE — Discharge Summary (Signed)
Physician Discharge Summary Note  Patient:  Gary Buck is an 51 y.o., male MRN:  [id number] DOB:  July 10, 1965 Patient phone:  [phone] (home)  Patient address:   [address]  [place] [address],  Total Time spent with patient: 30 minutes  Date of Admission:  06/28/2016 Date of Discharge: 07/03/2016  Reason for Admission:  Irritable, reporting homicidal ideation  Principal Problem: Bipolar affective disorder, current episode mixed, without psychotic features [facility]) Discharge Diagnoses: Patient Active Problem List   Diagnosis Date Noted  . Bipolar affective disorder, mixed, severe ([facility]) [F31.63] 06/28/2016  . Bipolar affective disorder, current episode mixed, without psychotic features ([facility]) [F31.60] 06/28/2016  . Atrial fibrillation, unspecified [I48.91]   . Elevated troponin [R74.8]   . Atrial fibrillation with RVR ([facility]) [I48.91] 12/21/2014  . CAD (coronary artery disease) [I25.10] 12/21/2014  . Chest pain [R07.9]   . Essential hypertension [I10]   . Remote history of stroke [Z86.73]   . Polysubstance abuse [F19.10]     Past Psychiatric History: see HPI  Past Medical History:  Past Medical History:  Diagnosis Date  . Anginal pain ([facility])   . Bipolar disorder ([facility])   . Depression   . ETOH abuse   . H/O cocaine abuse   . Hypercholesterolemia   . Hypertension   . PAF (paroxysmal atrial fibrillation) ([facility])    Gary Buck 12/21/2014  . Stroke [facility]) 2004   pt sts about 12 years ago.   . Tobacco abuse   . Type II diabetes mellitus ([facility]) dx'd 12/21/2014    Past Surgical History:  Procedure Laterality Date  . CYSTECTOMY  ~ 2004   "on the top of my forehead"  . INGUINAL HERNIA REPAIR Left ~ 2012   Family History:  Family History  Problem Relation Age of Onset  . Diabetes Mellitus II Mother    Family Psychiatric  History: see HPI Social History:  History  Alcohol Use  . 32.4 oz/week  . 54 Cans of beer per week    Comment: 12/21/2014 "2-3, 24oz  beers per night  during work nights, 6 24oz beer per day on the weekends"     History  Drug Use  . Types: Cocaine    Comment: 12/21/2014 "last cocaine was ~ 2 months ago"    Social History   Social History  . Marital status: Single    Spouse name: N/A  . Number of children: N/A  . Years of education: N/A   Social History Main Topics  . Smoking status: Current Every Day Smoker    Packs/day: 1.00    Years: 32.00    Types: Cigarettes  . Smokeless tobacco: Never Used  . Alcohol use 32.4 oz/week    54 Cans of beer per week     Comment: 12/21/2014 "2-3, 24oz  beers per night during work nights, 6 24oz beer per day on the weekends"  . Drug use:     Types: Cocaine     Comment: 12/21/2014 "last cocaine was ~ 2 months ago"  . Sexual activity: Yes   Other Topics Concern  . None   Social History Narrative  . None    Hospital Course:   Gary Buck was admitted for Bipolar affective disorder, current episode mixed, without psychotic features ([facility]) and crisis management.  Patient was treated with medications with their indications listed below in detail under Medication List.  Medical problems were identified and treated as needed.  Home medications were restarted as appropriate.  Improvement was monitored by observation  and Havery Morosexter Sopko daily report of symptom reduction.  Emotional and mental status was monitored by daily self inventory reports completed by Havery Morosexter Vana and clinical staff.  Patient reported continued improvement, denied any new concerns.  Patient had been compliant on medications and denied side effects.  Support and encouragement was provided.         Gary Buck was evaluated by the treatment team for stability and plans for continued recovery upon discharge.  Patient was offered further treatment options upon discharge including Residential, Intensive Outpatient and Outpatient treatment. Patient will follow up with agency listed below for medication management and counseling.   Encouraged patient to maintain satisfactory support network and home environment.  Advised to adhere to medication compliance and outpatient treatment follow up.  Prescriptions provided.       Gary Buck motivation was an integral factor for scheduling further treatment.  Employment, transportation, bed availability, health status, family support, and any pending legal issues were also considered during patient's hospital stay.  Upon completion of this admission the patient was both mentally and medically stable for discharge denying suicidal/homicidal ideation, auditory/visual/tactile hallucinations, delusional thoughts and paranoia.      Physical Findings: AIMS: Facial and Oral Movements Muscles of Facial Expression: None, normal Lips and Perioral Area: None, normal Jaw: None, normal Tongue: None, normal,Extremity Movements Upper (arms, wrists, hands, fingers): None, normal Lower (legs, knees, ankles, toes): None, normal, Trunk Movements Neck, shoulders, hips: None, normal, Overall Severity Severity of abnormal movements (highest score from questions above): None, normal Incapacitation due to abnormal movements: None, normal Patient's awareness of abnormal movements (rate only patient's report): No Awareness, Dental Status Current problems with teeth and/or dentures?: No Does patient usually wear dentures?: No  CIWA:  CIWA-Ar Total: 1 COWS:  COWS Total Score: 1  Musculoskeletal: Strength & Muscle Tone: within normal limits Gait & Station: normal Patient leans: N/A  Psychiatric Specialty Exam:  See MD SRA Physical Exam  Nursing note and vitals reviewed.   ROS  Blood pressure (!) 161/87, pulse 72, temperature 97.9 F (36.6 C), temperature source Oral, resp. rate 20, height 6' 5.5" (1.969 m), weight 133.4 kg (294 lb).Body mass index is 34.41 kg/m.   Have you used any form of tobacco in the last 30 days? (Cigarettes, Smokeless Tobacco, Cigars, and/or Pipes): Yes  Has this  patient used any form of tobacco in the last 30 days? (Cigarettes, Smokeless Tobacco, Cigars, and/or Pipes) Yes, N/A  Blood Alcohol level:  Lab Results  Component Value Date   [facility] 06/28[facility]   ETH <5 12/21/2014    Metabolic Disorder Labs:  Lab Results  Component Value Date   HGBA1C 5.5 06/30/2016   MPG 111 06/30/2016   MPG 131 12/22/2014   Lab Results  Component Value Date   PROLACTIN 15.6 (H) 06/30/2016   Lab Results  Component Value Date   CHOL 169 06/30/2016   TRIG 83 06/30/2016   HDL 62 06/30/2016   CHOLHDL 2.7 06/30/2016   VLDL 17 06/30/2016   LDLCALC 90 06/30/2016   LDLCALC 90 12/22/2014    See Psychiatric Specialty Exam and Suicide Risk Assessment completed by Attending Physician prior to discharge.  Discharge destination:  Home  Is patient on multiple antipsychotic therapies at discharge:  No   Has Patient had three or more failed trials of antipsychotic monotherapy by history:  No  Recommended Plan for Multiple Antipsychotic Therapies: NA   Allergies as of 07/03/2016   No Known Allergies     Medication  List    STOP taking these medications   EXCEDRIN EXTRA STRENGTH PO   lisinopril-hydrochlorothiazide 20-25 MG tablet Commonly known as:  PRINZIDE,ZESTORETIC   simvastatin 20 MG tablet Commonly known as:  ZOCOR     TAKE these medications     Indication  apixaban 5 MG Tabs tablet Commonly known as:  ELIQUIS Take 1 tablet (5 mg total) by mouth 2 (two) times daily.  Indication:  Cerebrovascular accident secondary to Atrial Fibrillation   atorvastatin 10 MG tablet Commonly known as:  LIPITOR Take 1 tablet (10 mg total) by mouth daily at 6 PM.  Indication:  High Amount of Fats in the Blood   diltiazem 180 MG 24 hr capsule Commonly known as:  CARDIZEM CD Take 1 capsule (180 mg total) by mouth daily. Start taking on:  07/04/2016  Indication:  High Blood Pressure Disorder   lisinopril 40 MG tablet Commonly known as:  PRINIVIL,ZESTRIL Take 1  tablet (40 mg total) by mouth daily. Start taking on:  07/04/2016  Indication:  High Blood Pressure Disorder   metoprolol tartrate 25 MG tablet Commonly known as:  LOPRESSOR Take 1 tablet (25 mg total) by mouth 2 (two) times daily.  Indication:  High Blood Pressure Disorder   nicotine 21 mg/24hr patch Commonly known as:  NICODERM CQ - dosed in mg/24 hours Place 1 patch (21 mg total) onto the skin daily. Start taking on:  07/04/2016  Indication:  Nicotine Addiction   Oxcarbazepine 300 MG tablet Commonly known as:  TRILEPTAL Take 1 tablet (300 mg total) by mouth 2 (two) times daily.  Indication:  Manic-Depression   risperiDONE 1 MG tablet Commonly known as:  RISPERDAL Take 1 tablet (1 mg total) by mouth 2 (two) times daily.  Indication:  Manic-Depression   thiamine 100 MG tablet Take 1 tablet (100 mg total) by mouth daily. Start taking on:  07/04/2016  Indication:  Deficiency in Thiamine or Vitamin B1      Follow-up Information    [facility] Follow up.   Why:  Please go within 1-3 days of discharge to have your initial assessment for medication and management services. Walk in hours are M, W, Th, and Fri- please arrive at 8:45am. Contact information: [address] [place], [place] [address] [phone] Fax: [phone]          Follow-up recommendations:  Activity:  as tol Diet:  as tol  Comments: 1.  Take all your medications as prescribed.   2.  Report any adverse side effects to outpatient provider. 3.  Patient instructed to not use alcohol or illegal drugs while on prescription medicines. 4.  In the event of worsening symptoms, instructed patient to call 911, the crisis hotline or go to nearest emergency room for evaluation of symptoms.  Signed: Lindwood Qua, NP [facility] 07/03/2016, 11:11 AM   Patient seen, Suicide Assessment Completed.  Disposition Plan Reviewed

## 2016-07-03 NOTE — Tx Team (Signed)
Interdisciplinary Treatment and Diagnostic Plan Update  07/03/2016 Time of Session: 10:15 AM  Gary Buck MRN: [id number]  Principal Diagnosis: Bipolar affective disorder, current episode mixed, without psychotic features (HCC)  Secondary Diagnoses: Principal Problem:   Bipolar affective disorder, current episode mixed, without psychotic features (HCC)   Current Medications:  Current Facility-Administered Medications  Medication Dose Route Frequency Provider Last Rate Last Dose  . acetaminophen (TYLENOL) tablet 650 mg  650 mg Oral Q6H PRN Charm RingsJamison Y Lord, NP   650 mg at 07/02/16 2331  . alum & mag hydroxide-simeth (MAALOX/MYLANTA) 200-200-20 MG/5ML suspension 30 mL  30 mL Oral Q4H PRN Charm RingsJamison Y Lord, NP      . apixaban (ELIQUIS) tablet 5 mg  5 mg Oral BID Charm RingsJamison Y Lord, NP   5 mg at 07/03/16 0814  . atorvastatin (LIPITOR) tablet 10 mg  10 mg Oral q1800 Charm RingsJamison Y Lord, NP   10 mg at 07/02/16 1829  . diltiazem (CARDIZEM CD) 24 hr capsule 180 mg  180 mg Oral Daily Charm RingsJamison Y Lord, NP   180 mg at 07/03/16 0814  . hydrALAZINE (APRESOLINE) tablet 25 mg  25 mg Oral Q6H PRN Vassie Lollarlos Madera, MD   25 mg at 07/02/16 1651  . lisinopril (PRINIVIL,ZESTRIL) tablet 40 mg  40 mg Oral Daily Vassie Lollarlos Madera, MD   40 mg at 07/03/16 0814  . magnesium hydroxide (MILK OF MAGNESIA) suspension 30 mL  30 mL Oral Daily PRN Charm RingsJamison Y Lord, NP      . metoprolol tartrate (LOPRESSOR) tablet 25 mg  25 mg Oral BID Charm RingsJamison Y Lord, NP   25 mg at 07/03/16 0815  . multivitamin with minerals tablet 1 tablet  1 tablet Oral Daily Craige CottaFernando A Cobos, MD   1 tablet at 07/03/16 0815  . nicotine (NICODERM CQ - dosed in mg/24 hours) patch 21 mg  21 mg Transdermal Daily Jackelyn PolingJason A Berry, NP   21 mg at 07/03/16 0816  . Oxcarbazepine (TRILEPTAL) tablet 300 mg  300 mg Oral BID Charm RingsJamison Y Lord, NP   300 mg at 07/03/16 0815  . risperiDONE (RISPERDAL) tablet 1 mg  1 mg Oral BID Charm RingsJamison Y Lord, NP   1 mg at 07/03/16 0815  . thiamine (VITAMIN B-1)  tablet 100 mg  100 mg Oral Daily Craige CottaFernando A Cobos, MD   100 mg at 07/03/16 0815    PTA Medications: Prescriptions Prior to Admission  Medication Sig Dispense Refill Last Dose  . apixaban (ELIQUIS) 5 MG TABS tablet Take 1 tablet (5 mg total) by mouth 2 (two) times daily. 60 tablet 12 6 months ago  . Aspirin-Acetaminophen-Caffeine (EXCEDRIN EXTRA STRENGTH PO) Take 2 tablets by mouth every 6 (six) hours as needed (For pain.).    2 weeks ago  . diltiazem (CARDIZEM CD) 180 MG 24 hr capsule Take 1 capsule (180 mg total) by mouth daily. 30 capsule 11 6 months ago  . lisinopril-hydrochlorothiazide (PRINZIDE,ZESTORETIC) 20-25 MG per tablet Take 1 tablet by mouth daily.   6 months ago  . metoprolol tartrate (LOPRESSOR) 25 MG tablet Take 1 tablet (25 mg total) by mouth 2 (two) times daily. 60 tablet 11 6 months ago  . simvastatin (ZOCOR) 20 MG tablet Take 20 mg by mouth daily.   6 months ago    Treatment Modalities: Medication Management, Group therapy, Case management,  1 to 1 session with clinician, Psychoeducation, Recreational therapy.  Patient Stressors: Financial difficulties Occupational concerns  Patient Strengths: Ability for Warden/rangerinsight Communication skills Motivation for treatment/growth  Supportive family/friends  Physician Treatment Plan for Primary Diagnosis: Bipolar affective disorder, current episode mixed, without psychotic features (HCC) Long Term Goal(s): Improvement in symptoms so as ready for discharge  Short Term Goals: Ability to verbalize feelings will improve Ability to disclose and discuss suicidal ideas Ability to demonstrate self-control will improve Ability to identify and develop effective coping behaviors will improve Ability to maintain clinical measurements within normal limits will improve Ability to verbalize feelings will improve Ability to disclose and discuss suicidal ideas Ability to demonstrate self-control will improve Ability to identify and develop  effective coping behaviors will improve Ability to maintain clinical measurements within normal limits will improve Compliance with prescribed medications will improve Ability to identify triggers associated with substance abuse/mental health issues will improve  Medication Management: Evaluate patient's response, side effects, and tolerance of medication regimen.  Therapeutic Interventions: 1 to 1 sessions, Unit Group sessions and Medication administration.  Evaluation of Outcomes: Adequate for Discharge  Physician Treatment Plan for Secondary Diagnosis: Principal Problem:   Bipolar affective disorder, current episode mixed, without psychotic features (HCC)   Long Term Goal(s): Improvement in symptoms so as ready for discharge  Short Term Goals: Ability to verbalize feelings will improve Ability to disclose and discuss suicidal ideas Ability to demonstrate self-control will improve Ability to identify and develop effective coping behaviors will improve Ability to maintain clinical measurements within normal limits will improve Ability to verbalize feelings will improve Ability to disclose and discuss suicidal ideas Ability to demonstrate self-control will improve Ability to identify and develop effective coping behaviors will improve Ability to maintain clinical measurements within normal limits will improve Compliance with prescribed medications will improve Ability to identify triggers associated with substance abuse/mental health issues will improve  Medication Management: Evaluate patient's response, side effects, and tolerance of medication regimen.  Therapeutic Interventions: 1 to 1 sessions, Unit Group sessions and Medication administration.  Evaluation of Outcomes: Adequate for Discharge   RN Treatment Plan for Primary Diagnosis: Bipolar affective disorder, current episode mixed, without psychotic features (HCC) Long Term Goal(s): Knowledge of disease and therapeutic  regimen to maintain health will improve  Short Term Goals: Ability to verbalize feelings will improve, Ability to disclose and discuss suicidal ideas and Ability to identify and develop effective coping behaviors will improve  Medication Management: RN will administer medications as ordered by provider, will assess and evaluate patient's response and provide education to patient for prescribed medication. RN will report any adverse and/or side effects to prescribing provider.  Therapeutic Interventions: 1 on 1 counseling sessions, Psychoeducation, Medication administration, Evaluate responses to treatment, Monitor vital signs and CBGs as ordered, Perform/monitor CIWA, COWS, AIMS and Fall Risk screenings as ordered, Perform wound care treatments as ordered.  Evaluation of Outcomes: Adequate for Discharge   LCSW Treatment Plan for Primary Diagnosis: Bipolar affective disorder, current episode mixed, without psychotic features (HCC) Long Term Goal(s): Safe transition to appropriate next level of care at discharge, Engage patient in therapeutic group addressing interpersonal concerns.  Short Term Goals: Engage patient in aftercare planning with referrals and resources, Facilitate patient progression through stages of change regarding substance use diagnoses and concerns and Increase skills for wellness and recovery  Therapeutic Interventions: Assess for all discharge needs, 1 to 1 time with Social worker, Explore available resources and support systems, Assess for adequacy in community support network, Educate family and significant other(s) on suicide prevention, Complete Psychosocial Assessment, Interpersonal group therapy.  Evaluation of Outcomes: Adequate for Discharge   Progress in Treatment: Attending  groups: Yes  Participating in groups: Yes Taking medication as prescribed: Yes, MD continues to assess for medication changes as needed Toleration medication: Yes, no side effects reported at  this time Family/Significant other contact made: No, Pt declines Patient understands diagnosis: Developing insight Discussing patient identified problems/goals with staff: Yes Medical problems stabilized or resolved: Yes Denies suicidal/homicidal ideation: Yes Issues/concerns per patient self-inventory: None Other: N/A  New problem(s) identified: None identified at this time.   New Short Term/Long Term Goal(s): None identified at this time.   Discharge Plan or Barriers: Pt   Reason for Continuation of Hospitalization: None identified at this time.   Estimated Length of Stay: 0 days  Attendees: Patient: 07/03/2016  10:15 AM  Physician: Dr. Jama Flavors 07/03/2016  10:15 AM  Nursing: Quintella Reichert, RN 07/03/2016  10:15 AM  RN Care Manager: Onnie Boer, RN 07/03/2016  10:15 AM  Social Worker: Vernie Shanks, LCSW; Heather Smart, LCSW 07/03/2016  10:15 AM  Recreational Therapist:  07/03/2016  10:15 AM  Other: Gray Bernhardt, NP; Malachy Chamber, NP; Margreta Journey, NP 07/03/2016  10:15 AM  Other:  07/03/2016  10:15 AM  Other: 07/03/2016  10:15 AM    Scribe for Treatment Team: Verdene Lennert, LCSW 07/03/2016 10:15 AM

## 2016-07-03 NOTE — [facility] Suicide Risk Assessment (Signed)
[facility] Discharge Suicide Risk Assessment   Principal Problem: Bipolar affective disorder, current episode mixed, without psychotic features [facility]) Discharge Diagnoses:  Patient Active Problem List   Diagnosis Date Noted  . Bipolar affective disorder, mixed, severe (HCC) [F31.63] 06/28/2016  . Bipolar affective disorder, current episode mixed, without psychotic features (HCC) [F31.60] 06/28/2016  . Atrial fibrillation, unspecified [I48.91]   . Elevated troponin [R74.8]   . Atrial fibrillation with RVR (HCC) [I48.91] 12/21/2014  . CAD (coronary artery disease) [I25.10] 12/21/2014  . Chest pain [R07.9]   . Essential hypertension [I10]   . Remote history of stroke [Z86.73]   . Polysubstance abuse [F19.10]     Total Time spent with patient: 30 minutes  Musculoskeletal: Strength & Muscle Tone: within normal limits Gait & Station: normal Patient leans: N/A  Psychiatric Specialty Exam: ROS denies headache, no chest pain, no shortness of breath, no nausea or vomiting   Blood pressure 110/62, pulse 92, temperature 97.9 F (36.6 C), temperature source Oral, resp. rate 20, height 6' 5.5" (1.969 m), weight 133.4 kg (294 lb).Body mass index is 34.41 kg/m.  General Appearance: improved grooming   Eye Contact::  Good  Speech:  Normal Rate409  Volume:  Normal  Mood:  improved, states that he feels "OK"- denies depression  Affect:  Appropriate and reactive, not irritable   Thought Process:  Linear  Orientation:  Full (Time, Place, and Person)  Thought Content:  denies hallucinations, no delusions, not internally preoccupied   Suicidal Thoughts:  No denies suicidal or self injurious ideations, denies any homicidal or violent ideations   Homicidal Thoughts:  No  Memory:  recent and remote grossly intact   Judgement:  Other:  improved   Insight:  improved   Psychomotor Activity:  WNL- no restlessness , no agitation   Concentration:  Good  Recall:  Good  Fund of Knowledge:Good  Language: Good   Akathisia:  Negative  Handed:  Right  AIMS (if indicated):     Assets:  Communication Skills Desire for Improvement Resilience  Sleep:  Number of Hours: 5  Cognition: WNL  ADL's:  Intact   Mental Status Per Nursing Assessment::   On Admission:  NA  Demographic Factors:  51 year old male, has two adult daughters, currently homeless    Loss Factors: Homelessness, limited support network   Historical Factors: History of Mood Disorder, in the past diagnosed with Bipolar Disorder  Risk Reduction Factors:   Sense of responsibility to family and Positive coping skills or problem solving skills  Continued Clinical Symptoms:  At this time patient is improved compared to admission. Today presents alert, attentive,well related, mood improved , presents euthymic, affect appropriate, full in range, no thought disorder, no SI or HI, no psychotic symptoms, future oriented . Denies medication side effects. No disruptive behaviors on unit   Cognitive Features That Contribute To Risk:  No gross cognitive deficits noted upon discharge. Is alert , attentive, and oriented x 3   Suicide Risk:  Mild:  Suicidal ideation of limited frequency, intensity, duration, and specificity.  There are no identifiable plans, no associated intent, mild dysphoria and related symptoms, good self-control (both objective and subjective assessment), few other risk factors, and identifiable protective factors, including available and accessible social support.  Follow-up Information    [facility] Follow up.   Why:  Please go within 1-3 days of discharge to have your initial assessment for medication and management services. Walk in hours are M, W, Th, and Fri-  please arrive at 8:45am. Contact information: [address] [place], [place] [phone] Fax: [phone]          Plan Of Care/Follow-up recommendations:  Activity:  as tolerated  Diet:  heart  healthy Tests:  NA Other:  see below Leaving in good spirits  [facility] Patient plans to follow up at [facility] for psychiatric follow up  [facility] [place] for medical follow up as needed  Nehemiah MassedOBOS, Maris Bena, MD 07/03/2016, 11:55 AM

## 2016-07-03 NOTE — [facility] Suicide Risk Assessment (Signed)
[facility] INPATIENT:  Family/Significant Other Suicide Prevention Education  Suicide Prevention Education:  Education Completed; Gary Buck, Pt's sister [phone], has been identified by the patient as the family member/significant other with whom the patient will be residing, and identified as the person(s) who will aid the patient in the event of a mental health crisis (suicidal ideations/suicide attempt).  With written consent from the patient, the family member/significant other has been provided the following suicide prevention education, prior to the and/or following the discharge of the patient.  The suicide prevention education provided includes the following:  Suicide risk factors  Suicide prevention and interventions  National Suicide Hotline telephone number  [facility] assessment telephone number  [facility] Emergency Assistance 911  [facility] and/or Residential Mobile Crisis Unit telephone number  Request made of family/significant other to:  Remove weapons (e.g., guns, rifles, knives), all items previously/currently identified as safety concern.    Remove drugs/medications (over-the-counter, prescriptions, illicit drugs), all items previously/currently identified as a safety concern.  The family member/significant other verbalizes understanding of the suicide prevention education information provided.  The family member/significant other agrees to remove the items of safety concern listed above.  Gary Buck 07/03/2016, 11:30 AM

## 2016-07-03 NOTE — Progress Notes (Signed)
Discharge Note:  Patient discharged home with family member.  Patient denied SI and HI.  Denied A/V hallucinations.  Suicide prevention information given and discussed with patient who stated he understood and had no questions.  Patient stated he received all his belongings, clothing, belt, shoes, toiletries, misc items, prescriptions, medications, etc.  Patient stated he appreciated all [facility].  All required discharge information given to patient at discharge.

## 2016-07-03 NOTE — Progress Notes (Addendum)
  [facility] :  Will you be returning to the same living situation after discharge:  No. [facility] discharge At discharge, do you have transportation home?: Yes,  Pt provided with bus pass and petty cash Do you have the ability to pay for your medications: Yes,  Pt provided with samples and prescriptions  Release of information consent forms completed and in the chart;  Patient's signature needed at discharge.  Patient to Follow up at: Follow-up Information    [facility] Follow up.   Why:  Please go within 1-3 days of discharge to have your initial assessment for medication and management services. Walk in hours are M, W, Th, and Fri- please arrive at 8:45am. Contact information: [address] [place], [place] [phone] Fax: [phone]          Next level of care provider has access to [facility] Link:no  Safety Planning and Suicide Prevention discussed: Yes,  with sister; see SPE note  Have you used any form of tobacco in the last 30 days? (Cigarettes, Smokeless Tobacco, Cigars, and/or Pipes): Yes  Has patient been referred to the Quitline?: Patient refused referral  Patient has been referred for addiction treatment: Yes  Verdene LennertLauren C Currie Dennin 07/03/2016, 10:32 AM

## 2016-07-03 NOTE — [facility] Group Notes (Signed)
The focus of this group is to educate the patient on the purpose and policies of crisis stabilization and provide a format to answer questions about their admission.  The group details unit policies and expectations of patients while admitted.  Patient did not attend 0900 nurse education orientation group this morning.  Patient stayed in room.  

## 2016-07-03 NOTE — [facility] Suicide Risk Assessment (Deleted)
[facility] INPATIENT:  Family/Significant Other Suicide Prevention Education  Suicide Prevention Education:  Patient Refusal for Family/Significant Other Suicide Prevention Education: The patient Gary Buck has refused to provide written consent for family/significant other to be provided Family/Significant Other Suicide Prevention Education during admission and/or prior to discharge.  Physician notified.  Verdene LennertLauren C Bard Haupert 07/03/2016, 10:15 AM

## 2016-07-03 NOTE — Progress Notes (Signed)
D:  Patient's self inventory sheet, patient sleeps good, no sleep medication given.  Good appetite, normal energy level, good concentration.  Rated depression and hopeless 2, anxiety 3.  Denied withdrawals.  Denied SI.  Denied physical problems.  Denied physical pain.  Goal is "me".  Plans to go forward.  Has "fear of failure, but optimistic".  Does have discharge plans.  "Me" will keep him from following discharge plans. A:  Medications administered per MD orders.  Emotional support and encouragement given patient. R:  Denied SI and HI, contracts for safety.  Denied A/V hallucinations.  Safety maintained with 15 minute checks.

## 2016-07-04 LAB — 10-HYDROXYCARBAZEPINE: Triliptal/MTB(Oxcarbazepin): 3 ug/mL — ABNORMAL LOW (ref 10–35)

## 2016-07-20 ENCOUNTER — Encounter (HOSPITAL_COMMUNITY): Payer: Self-pay

## 2016-07-20 ENCOUNTER — Encounter (HOSPITAL_COMMUNITY): Payer: Self-pay | Admitting: Nurse Practitioner

## 2016-07-20 ENCOUNTER — Emergency Department (HOSPITAL_COMMUNITY): Payer: Self-pay

## 2016-07-20 ENCOUNTER — Emergency Department (HOSPITAL_COMMUNITY)
Admission: EM | Admit: 2016-07-20 | Discharge: 2016-07-20 | Discharge status: Against Medical Advice | Payer: Self-pay | Attending: Emergency Medicine | Admitting: Emergency Medicine

## 2016-07-20 ENCOUNTER — Emergency Department (HOSPITAL_COMMUNITY)
Admission: EM | Admit: 2016-07-20 | Discharge: 2016-07-20 | Disposition: A | Discharge status: Home / Self Care | Payer: 59 | Attending: Emergency Medicine | Admitting: Emergency Medicine

## 2016-07-20 ENCOUNTER — Encounter: Payer: Self-pay | Admitting: Pediatric Intensive Care

## 2016-07-20 DIAGNOSIS — I2510 Atherosclerotic heart disease of native coronary artery without angina pectoris: Secondary | ICD-10-CM | POA: Insufficient documentation

## 2016-07-20 DIAGNOSIS — Z7901 Long term (current) use of anticoagulants: Secondary | ICD-10-CM | POA: Insufficient documentation

## 2016-07-20 DIAGNOSIS — Z79899 Other long term (current) drug therapy: Secondary | ICD-10-CM | POA: Insufficient documentation

## 2016-07-20 DIAGNOSIS — E119 Type 2 diabetes mellitus without complications: Secondary | ICD-10-CM | POA: Insufficient documentation

## 2016-07-20 DIAGNOSIS — R079 Chest pain, unspecified: Secondary | ICD-10-CM | POA: Insufficient documentation

## 2016-07-20 DIAGNOSIS — F17210 Nicotine dependence, cigarettes, uncomplicated: Secondary | ICD-10-CM | POA: Insufficient documentation

## 2016-07-20 DIAGNOSIS — I10 Essential (primary) hypertension: Secondary | ICD-10-CM

## 2016-07-20 DIAGNOSIS — Z8673 Personal history of transient ischemic attack (TIA), and cerebral infarction without residual deficits: Secondary | ICD-10-CM | POA: Insufficient documentation

## 2016-07-20 DIAGNOSIS — Z760 Encounter for issue of repeat prescription: Secondary | ICD-10-CM | POA: Insufficient documentation

## 2016-07-20 MED ORDER — APIXABAN 5 MG PO TABS
5.0000 mg | ORAL_TABLET | Freq: Two times a day (BID) | ORAL | Status: DC
  Administered 2016-07-20: 5 mg via ORAL
  Filled 2016-07-20: qty 1

## 2016-07-20 MED ORDER — ATORVASTATIN CALCIUM 10 MG PO TABS
10.0000 mg | ORAL_TABLET | Freq: Every day | ORAL | Status: DC
  Administered 2016-07-20: 10 mg via ORAL
  Filled 2016-07-20: qty 1

## 2016-07-20 MED ORDER — METOPROLOL TARTRATE 25 MG PO TABS
25.0000 mg | ORAL_TABLET | Freq: Two times a day (BID) | ORAL | Status: DC
Start: 2016-07-20 — End: 2016-07-20

## 2016-07-20 MED ORDER — LISINOPRIL 20 MG PO TABS: 40.0000 mg | ORAL_TABLET | Freq: Every day | ORAL | Status: DC

## 2016-07-20 MED ORDER — DILTIAZEM HCL ER COATED BEADS 180 MG PO CP24
180.0000 mg | ORAL_CAPSULE | Freq: Every day | ORAL | Status: DC
  Administered 2016-07-20: 180 mg via ORAL
  Filled 2016-07-20: qty 1

## 2016-07-20 MED ORDER — DILTIAZEM HCL ER COATED BEADS 180 MG PO CP24: 180.0000 mg | ORAL_CAPSULE | Freq: Every day | ORAL | 0 refills | Status: DC

## 2016-07-20 MED ORDER — OXCARBAZEPINE 300 MG PO TABS: 300.0000 mg | ORAL_TABLET | Freq: Two times a day (BID) | ORAL | Status: DC

## 2016-07-20 MED ORDER — LISINOPRIL 20 MG PO TABS
40.0000 mg | ORAL_TABLET | Freq: Every day | ORAL | Status: DC
  Administered 2016-07-20: 40 mg via ORAL
  Filled 2016-07-20: qty 2

## 2016-07-20 MED ORDER — APIXABAN 5 MG PO TABS: 5.0000 mg | ORAL_TABLET | Freq: Two times a day (BID) | ORAL | 0 refills | Status: DC

## 2016-07-20 MED ORDER — LISINOPRIL 40 MG PO TABS: 40.0000 mg | ORAL_TABLET | Freq: Every day | ORAL | 0 refills | Status: DC

## 2016-07-20 MED ORDER — RISPERIDONE 1 MG PO TABS
1.0000 mg | ORAL_TABLET | Freq: Two times a day (BID) | ORAL | Status: DC
  Administered 2016-07-20: 1 mg via ORAL
  Filled 2016-07-20: qty 1

## 2016-07-20 MED ORDER — ATORVASTATIN CALCIUM 10 MG PO TABS: 10.0000 mg | ORAL_TABLET | Freq: Every day | ORAL | 0 refills | Status: DC

## 2016-07-20 MED ORDER — METOPROLOL TARTRATE 25 MG PO TABS: 25.0000 mg | ORAL_TABLET | Freq: Two times a day (BID) | ORAL | 0 refills | Status: DC

## 2016-07-20 MED ORDER — RISPERIDONE 1 MG PO TABS: 1.0000 mg | ORAL_TABLET | Freq: Two times a day (BID) | ORAL | 0 refills | Status: DC

## 2016-07-20 MED ORDER — OXCARBAZEPINE 300 MG PO TABS: 300.0000 mg | ORAL_TABLET | Freq: Two times a day (BID) | ORAL | 0 refills | Status: DC

## 2016-07-20 NOTE — ED Notes (Signed)
Pt stated he has been out of his medication for two weeks now.

## 2016-07-20 NOTE — ED Triage Notes (Addendum)
Pt here with htn.  Pt states he has been having sweats. Blurred vision at times.  No headache. Has been out of meds x 1 week.  Lives in shelter.  Told to come here by social worker to get help with meds.

## 2016-07-20 NOTE — ED Provider Notes (Signed)
[facility]-EMERGENCY DEPT Provider Note   CSN: [id number] Arrival date & time: 07/20/16  1517     History   Chief Complaint Chief Complaint  Patient presents with  . Hypertension    HPI Gary Buck is a 51 y.o. male.  Patient is a 51 year old male with a history of bipolar disease, atrial fibrillation, coronary artery disease, hypertension, cocaine abuse, alcohol abuse, tobacco abuse and diabetes presenting today with hypertension. He states that he has been out of all of his medications for the last 2 weeks. He is here to get his medications. He denies any chest pain, shortness of breath, headaches or unilateral weakness or numbness. He was at [facility] earlier today trying to obtain his medications when he became angry and left. Patient is still requesting his medications and trying to figure out where he can get them filled.   The history is provided by the patient.  Hypertension     Past Medical History:  Diagnosis Date  . Anginal pain (HCC)   . Bipolar disorder (HCC)   . Depression   . ETOH abuse   . H/O cocaine abuse   . Hypercholesterolemia   . Hypertension   . PAF (paroxysmal atrial fibrillation) (HCC)    [name] 12/21/2014  . Stroke [facility]) 2004   pt sts about 12 years ago.   . Tobacco abuse   . Type II diabetes mellitus (HCC) dx'd 12/21/2014    Patient Active Problem List   Diagnosis Date Noted  . Bipolar affective disorder, mixed, severe (HCC) 06/28/2016  . Bipolar affective disorder, current episode mixed, without psychotic features (HCC) 06/28/2016  . Atrial fibrillation, unspecified   . Elevated troponin   . Atrial fibrillation with RVR (HCC) 12/21/2014  . CAD (coronary artery disease) 12/21/2014  . Chest pain   . Essential hypertension   . Remote history of stroke   . Polysubstance abuse     Past Surgical History:  Procedure Laterality Date  . CYSTECTOMY  ~ 2004   "on the top of my forehead"  . INGUINAL HERNIA REPAIR Left ~ 2012       Home  Medications    Prior to Admission medications   Medication Sig Start Date End Date Taking? Authorizing Provider  apixaban (ELIQUIS) 5 MG TABS tablet Take 1 tablet (5 mg total) by mouth 2 (two) times daily. 07/03/16  Yes Adonis Brook, NP  atorvastatin (LIPITOR) 10 MG tablet Take 1 tablet (10 mg total) by mouth daily at 6 PM. 07/03/16  Yes Adonis Brook, NP  diltiazem (CARDIZEM CD) 180 MG 24 hr capsule Take 1 capsule (180 mg total) by mouth daily. 07/04/16  Yes Adonis Brook, NP  ibuprofen (ADVIL,MOTRIN) 200 MG tablet Take 200 mg by mouth every 6 (six) hours as needed for moderate pain.   Yes Historical Provider, MD  lisinopril (PRINIVIL,ZESTRIL) 40 MG tablet Take 1 tablet (40 mg total) by mouth daily. 07/04/16  Yes Adonis Brook, NP  metoprolol tartrate (LOPRESSOR) 25 MG tablet Take 1 tablet (25 mg total) by mouth 2 (two) times daily. 07/03/16  Yes Adonis Brook, NP  nicotine (NICODERM CQ - DOSED IN MG/24 HOURS) 21 mg/24hr patch Place 1 patch (21 mg total) onto the skin daily. 07/04/16  Yes Adonis Brook, NP  Oxcarbazepine (TRILEPTAL) 300 MG tablet Take 1 tablet (300 mg total) by mouth 2 (two) times daily. 07/03/16  Yes Adonis Brook, NP  risperiDONE (RISPERDAL) 1 MG tablet Take 1 tablet (1 mg total) by mouth 2 (two) times daily. 07/03/16  Yes Adonis BrookSheila Agustin, NP  thiamine 100 MG tablet Take 1 tablet (100 mg total) by mouth daily. 07/04/16  Yes Adonis BrookSheila Agustin, NP    Family History Family History  Problem Relation Age of Onset  . Diabetes Mellitus II Mother     Social History Social History  Substance Use Topics  . Smoking status: Current Every Day Smoker    Packs/day: 1.00    Years: 32.00    Types: Cigarettes  . Smokeless tobacco: Never Used  . Alcohol use 32.4 oz/week    54 Cans of beer per week     Comment: 12/21/2014 "2-3, 24oz  beers per night during work nights, 6 24oz beer per day on the weekends"     Allergies   Patient has no known allergies.   Review of Systems Review of  Systems  All other systems reviewed and are negative.    Physical Exam Updated Vital Signs BP (!) 212/109 (BP Location: Right Arm)   Pulse 72   Temp 97.8 F (36.6 C) (Oral)   Resp 18   Ht 6\' 5"  (1.956 m)   Wt (!) 305 lb (138.3 kg)   SpO2 98%   BMI 36.17 kg/m   Physical Exam  Constitutional: He is oriented to person, place, and time. He appears well-developed and well-nourished. No distress.  HENT:  Head: Normocephalic and atraumatic.  Mouth/Throat: Oropharynx is clear and moist.  Eyes: Conjunctivae and EOM are normal. Pupils are equal, round, and reactive to light.  Neck: Normal range of motion. Neck supple.  Cardiovascular: Normal rate, regular rhythm and intact distal pulses.   No murmur heard. Pulmonary/Chest: Effort normal and breath sounds normal. No respiratory distress. He has no wheezes. He has no rales.  Abdominal: Soft. He exhibits no distension. There is no tenderness. There is no rebound and no guarding.  Musculoskeletal: Normal range of motion. He exhibits no edema or tenderness.  Neurological: He is alert and oriented to person, place, and time.  Skin: Skin is warm and dry. No rash noted. No erythema.  Psychiatric: He has a normal mood and affect. His behavior is normal.  Nursing note and vitals reviewed.    ED Treatments / Results  Labs (all labs ordered are listed, but only abnormal results are displayed) Labs Reviewed - No data to display  EKG  EKG Interpretation None       Radiology No results found.  Procedures Procedures (including critical care time)  Medications Ordered in ED Medications  apixaban (ELIQUIS) tablet 5 mg (not administered)  atorvastatin (LIPITOR) tablet 10 mg (not administered)  diltiazem (CARDIZEM CD) 24 hr capsule 180 mg (not administered)  lisinopril (PRINIVIL,ZESTRIL) tablet 40 mg (not administered)  metoprolol tartrate (LOPRESSOR) tablet 25 mg (not administered)  Oxcarbazepine (TRILEPTAL) tablet 300 mg (not  administered)  risperiDONE (RISPERDAL) tablet 1 mg (not administered)     Initial Impression / Assessment and Plan / ED Course  I have reviewed the triage vital signs and the nursing notes.  Pertinent labs & imaging results that were available during my care of the patient were reviewed by me and considered in my medical decision making (see chart for details).    Patient is here with hypertension which is uncontrolled because he is not on his blood pressure medications. He has been off them for 2 weeks and did admit to using 2 bags of cocaine a few days ago. Patient denies any end organ complaints at this time and is in no acute distress. He currently  was at [facility] earlier today when he became angry and left without receiving help. Here he was cooperative and calm. He was given a dose of his home medications. We'll have case management discussed with him where he can obtain his medicines. Patient recently had blood work done 2 weeks ago within normal renal function, hemoglobin.   Do not feel patient needs further imaging or testing at this time.  5:54 PM Burna Mortimer with case management working with pt to get his prescriptions. Final Clinical Impressions(s) / ED Diagnoses   Final diagnoses:  Essential hypertension    New Prescriptions Current Discharge Medication List       Gwyneth Sprout, MD 07/20/16 1755

## 2016-07-20 NOTE — ED Notes (Signed)
Pt walked out of room into lobby loudly cussing after speaking with case management per nurse tech.  Unsure of why patient left.

## 2016-07-20 NOTE — ED Triage Notes (Signed)
Pt presents with c/o hypertension. He ran out of his blood pressure medication about 2 weeks ago. [facility] and his blood pressure was 220/110 so they sent him to ED for further evaluation. He denies any physical complaints.

## 2016-07-20 NOTE — ED Notes (Signed)
Spoke with Ms. Gary Buck in regards to patient leaving.  Made her aware.

## 2016-07-20 NOTE — ED Notes (Signed)
Case management at bedside.

## 2016-07-20 NOTE — Progress Notes (Signed)
LCSWA informed RN that [facility] assist patients with medications.

## 2016-07-20 NOTE — ED Notes (Signed)
Being sent over by [facility]reg[facility] nurse, Shann MedalVictoria Hussey [phone] she saw patient in clinic today-[facility] [place]-states has been out of meds-history of Bi-polar, HTN, Afib-BP today was 220/110-patient asymptomatic-when patient is discharged, please contact TurkeyVictoria so she may fill patients meds

## 2016-07-20 NOTE — ED Provider Notes (Signed)
[facility]-EMERGENCY DEPT Provider Note   CSN: [id number] Arrival date & time: 07/20/16  1020   By signing my name below, I, Gary Buck, attest that this documentation has been prepared under the direction and in the presence of Gary Kubayler Leaphart, PA-C. Electronically Signed: Teofilo PodMatthew P. Buck, ED Scribe. 07/20/2016. 11:06 AM.   History   Chief Complaint Chief Complaint  Patient presents with  . Hypertension  . Medication Refill    The history is provided by the patient. No language interpreter was used.   HPI Comments:  Gary Buck is a 51 y.o. male with PMHx of HTN and DM who presents to the Emergency Department, here seeking a medication refill for BP medication that he ran out of 1 week ago, and states that he is now hypertensive. Pt takes 3 different HTN medications, and states that he is out of all of them, and he is also out of his bipolar medication. Pt's BP was 205/106 at the ED, and states that he is here to be evaluated for his high BP. Pt complains of intermittent associated symptoms including diaphoresis, blurred vision, headache, and chest pain. Pt states that he is not having headache or blurred vision at this time and has not had these symptoms in 2 weeks. He states that the chest pain lasts for 10-15 minute episodes, and states that intermittently his right arm will become numb but this has not happened in ~1 month. Pt reports hx of chronic neck, back, and knee pain. [facility]. Pt was told to come here by social worker to seek help with his medications. Pt states that he takes 800mg  ibuprofen which provides relief for his chronic pain and headaches. Pt denies other associated symptoms.   Past Medical History:  Diagnosis Date  . Anginal pain (HCC)   . Bipolar disorder (HCC)   . Depression   . ETOH abuse   . H/O cocaine abuse   . Hypercholesterolemia   . Hypertension   . PAF (paroxysmal atrial fibrillation) (HCC)    [name]/notes 12/21/2014  .  Stroke [facility](HCC) 2004   pt sts about 12 years ago.   . Tobacco abuse   . Type II diabetes mellitus (HCC) dx'd 12/21/2014    Patient Active Problem List   Diagnosis Date Noted  . Bipolar affective disorder, mixed, severe (HCC) 06/28/2016  . Bipolar affective disorder, current episode mixed, without psychotic features (HCC) 06/28/2016  . Atrial fibrillation, unspecified   . Elevated troponin   . Atrial fibrillation with RVR (HCC) 12/21/2014  . CAD (coronary artery disease) 12/21/2014  . Chest pain   . Essential hypertension   . Remote history of stroke   . Polysubstance abuse     Past Surgical History:  Procedure Laterality Date  . CYSTECTOMY  ~ 2004   "on the top of my forehead"  . INGUINAL HERNIA REPAIR Left ~ 2012       Home Medications    Prior to Admission medications   Medication Sig Start Date End Date Taking? Authorizing Provider  apixaban (ELIQUIS) 5 MG TABS tablet Take 1 tablet (5 mg total) by mouth 2 (two) times daily. 07/03/16   Adonis BrookSheila Agustin, NP  atorvastatin (LIPITOR) 10 MG tablet Take 1 tablet (10 mg total) by mouth daily at 6 PM. 07/03/16   Adonis BrookSheila Agustin, NP  diltiazem (CARDIZEM CD) 180 MG 24 hr capsule Take 1 capsule (180 mg total) by mouth daily. 07/04/16   Adonis BrookSheila Agustin, NP  lisinopril (PRINIVIL,ZESTRIL)  40 MG tablet Take 1 tablet (40 mg total) by mouth daily. 07/04/16   Adonis Brook, NP  metoprolol tartrate (LOPRESSOR) 25 MG tablet Take 1 tablet (25 mg total) by mouth 2 (two) times daily. 07/03/16   Adonis Brook, NP  nicotine (NICODERM CQ - DOSED IN MG/24 HOURS) 21 mg/24hr patch Place 1 patch (21 mg total) onto the skin daily. 07/04/16   Adonis Brook, NP  Oxcarbazepine (TRILEPTAL) 300 MG tablet Take 1 tablet (300 mg total) by mouth 2 (two) times daily. 07/03/16   Adonis Brook, NP  risperiDONE (RISPERDAL) 1 MG tablet Take 1 tablet (1 mg total) by mouth 2 (two) times daily. 07/03/16   Adonis Brook, NP  thiamine 100 MG tablet Take 1 tablet (100 mg total) by  mouth daily. 07/04/16   Adonis Brook, NP    Family History Family History  Problem Relation Age of Onset  . Diabetes Mellitus II Mother     Social History Social History  Substance Use Topics  . Smoking status: Current Every Day Smoker    Packs/day: 1.00    Years: 32.00    Types: Cigarettes  . Smokeless tobacco: Never Used  . Alcohol use 32.4 oz/week    54 Cans of beer per week     Comment: 12/21/2014 "2-3, 24oz  beers per night during work nights, 6 24oz beer per day on the weekends"     Allergies   Patient has no known allergies.   Review of Systems Review of Systems  Constitutional: Positive for diaphoresis.  Eyes: Negative for visual disturbance.  Cardiovascular: Positive for chest pain.  Neurological: Negative for headaches.     Physical Exam Updated Vital Signs BP (!) 205/106 (BP Location: Left Arm)   Pulse 72   Temp 98 F (36.7 C) (Oral)   Resp 18   SpO2 98%   Physical Exam  Constitutional: He is oriented to person, place, and time. He appears well-developed and well-nourished. No distress.  HENT:  Head: Normocephalic and atraumatic.  Right Ear: Tympanic membrane, external ear and ear canal normal.  Left Ear: Tympanic membrane, external ear and ear canal normal.  Nose: Nose normal.  Mouth/Throat: Uvula is midline, oropharynx is clear and moist and mucous membranes are normal.  Eyes: Conjunctivae and EOM are normal. Pupils are equal, round, and reactive to light.  Neck: Normal range of motion. Neck supple.  Cardiovascular: Normal rate, regular rhythm, normal heart sounds and intact distal pulses.  Exam reveals no gallop and no friction rub.   No murmur heard. Pulmonary/Chest: Effort normal and breath sounds normal. No respiratory distress. He exhibits no tenderness.  Abdominal: Soft. Bowel sounds are normal. He exhibits no distension. There is no tenderness. There is no rebound and no guarding.  Musculoskeletal: Normal range of motion.  Moving al four  extremities without any difficuytly  Lymphadenopathy:    He has no cervical adenopathy.  Neurological: He is alert and oriented to person, place, and time. GCS eye subscore is 4. GCS verbal subscore is 5. GCS motor subscore is 6.  The patient is alert, attentive, and oriented x 3. Speech is clear. Cranial nerve II-VII grossly intact. Negative pronator drift. Sensation intact. Strength 5/5 in all extremities. Reflexes 2+ and symmetric at biceps, triceps, knees, and ankles. Rapid alternating movement and fine finger movements intact. Romberg is absent. Posture and gait normal.   Skin: Skin is warm and dry. Capillary refill takes less than 2 seconds.  Psychiatric: He has a normal mood and affect.  Nursing note and vitals reviewed.    ED Treatments / Results  DIAGNOSTIC STUDIES:  Oxygen Saturation is 98% on RA, normal by my interpretation.    COORDINATION OF CARE:  11:05 AM Will order blood work, EKG, and will provide BP medication. Discussed treatment plan with pt at bedside and pt agreed to plan.  Labs (all labs ordered are listed, but only abnormal results are displayed) Labs Reviewed - No data to display  EKG  EKG Interpretation None       Radiology No results found.  Procedures Procedures (including critical care time)  Medications Ordered in ED Medications - No data to display   Initial Impression / Assessment and Plan / ED Course  I have reviewed the triage vital signs and the nursing notes.  Pertinent labs & imaging results that were available during my care of the patient were reviewed by me and considered in my medical decision making (see chart for details).     Pt presents with need for meds refill and complaint of cp and blurry vision ongoing for 2 weeks. Pt has been out of his meds for 1 weeks. [facility] manager who sent him to the ED. Pt states his bp was elevated at the house today and they sent him to the  ED. Pt states cp episode this am that lasted only on few seconds. PE is unremarkable. No focal neuro deficits. Pt needs cp work up. Ordered labs, ekg, cxr, and tropoin. Also ordered social worker consult to help with meds. Pt was to be moved to the back for further workup. Social worker saw pt and pt was upset about not being abel to get his meds for free. He was very angry and stormed out of the room and did not want to be seen or worked up. Feel pt should have workup but pt did not want to stay. Ambulated with normal gait. Pt eloped.   Final Clinical Impressions(s) / ED Diagnoses   Final diagnoses:  Hypertension, unspecified type  Medication refill  Chest pain, unspecified type    New Prescriptions Discharge Medication List as of 07/20/2016 11:35 AM    I personally performed the services described in this documentation, which was scribed in my presence. The recorded information has been reviewed and is accurate.     Rise Mu, PA-C 07/20/16 1155    Derwood Kaplan, MD 07/25/16 [phone]

## 2016-07-20 NOTE — Care Management (Signed)
ED CM received consult concerning medication assistance. Patient noted [facility]. Patient [facility] that [facility] was not available and will not be back until Monday after 9am. CM discussed MATCH program and the guidelines with patient who is agreeable to terms. Patient is homeless without funds for co-pay,which  was waived. [facility]  [facility] and given Letter with instructions on how to.redeem. Patient verbalized understanding teach back done. Updated Dr. Anitra LauthPlunkett and Marylene LandAngela RN in the ED. No further CM needs identified.

## 2016-07-20 NOTE — Progress Notes (Addendum)
   07/20/16 0000  CM Assessment  Expected Discharge Plan Home/Self Care  In-house Referral NA  Discharge Planning Services CM Consult;Medication Assistance  PAC Choice NA  Choice offered to / list presented to  Patient  DME Arranged N/A  DME Agency NA  HH Arranged NA  HH Agency NA  Status of Service Completed, signed off  Discharge Disposition Home/Self Care   ED CM consulted for medication assistance  Cm spoke with pt in his triage room  Pt is familiar with [facility], and maryann placey Pt reports his "[facility]"  Pt confirms with CM that [facility] a [facility] was [facility] when [facility]/c on 07/03/16. Pt states has a prescription for 30 supply of his medications that remain at [facility]  Risperdal, oxcarbazepine, metoprolol extended release. Lisinopril, apixaban, diltiazem were lists of medications sheets pt has in his  1125 Pt informed that [facility] has a program that could offer him med assistance but there would be a co pay of $3 or he had another option of getting medications [facility] or [facility] Pt states he has no money CM discussed no available copay program but assistance can be obtained in community  Pt noted pacing in Triage room stating he can not answer anymore questions.  "only my momma ask me questions"  Cm explained to pt that questions have to be asked of him to qualify him for medication assistance program CM interrupted during assess by Triage RN to take pt to room 21 and Imaging staff  Pt packed his items after agreeing to possible [facility] program vs going to [facility], opened the triage door, walked out and CM over heard pt ask the Triage nurse "how do I get out of here?" Pt left without receiving assistance  1134 ED PA/NP updated onpt having prescriptions [facility] at [facility] that he did not fill in [place] after [facility]  [facility]. Pt had not attempted to [facility] to [facility] nor [facility] to get assistance and agree to but did not wait to get a [employer] execut[employer] letter

## 2016-07-30 ENCOUNTER — Encounter: Payer: Self-pay | Admitting: Pediatric Intensive Care

## 2016-08-16 NOTE — Congregational Nurse Program (Signed)
Congregational Nurse Program Note  Date of Encounter: 07/20/2016  Past Medical History: Past Medical History:  Diagnosis Date  . Anginal pain (HCC)   . Bipolar disorder (HCC)   . Depression   . ETOH abuse   . H/O cocaine abuse   . Hypercholesterolemia   . Hypertension   . PAF (paroxysmal atrial fibrillation) (HCC)    Gary Buck/notes 12/21/2014  . Stroke [facility](HCC) 2004   pt sts about 12 years ago.   . Tobacco abuse   . Type II diabetes mellitus (HCC) dx'd 12/21/2014    Encounter Details:     CNP Questionnaire - 07/20/16 0900      Patient Demographics   Is this a new or existing patient? New   Patient is considered a/an Not Applicable   Race African-American/Black     Patient Assistance   Location of Patient Assistance [facility]   Patient's financial/insurance status Self-Pay (Uninsured)   Uninsured Patient ([employer] officer, trade unionCard/Care Connects) Yes   Interventions Not Applicable   Patient referred to apply for the following financial assistance Not Applicable   Food insecurities addressed Not Applicable   Transportation assistance Yes   Type of Assistance Bus Pass Given   Assistance securing medications No   Educational health offerings Not Applicable     Encounter Details   Primary purpose of visit Acute Illness/Condition Visit   Was an Emergency Department visit averted? No   Does patient have a medical provider? No   Patient referred to Emergency Department   Was a mental health screening completed? (GAINS tool) No   Does patient have dental issues? No   Does patient have vision issues? No   Does your patient have an abnormal blood pressure today? No   Since previous encounter, have you referred patient for abnormal blood pressure that resulted in a new diagnosis or medication change? No   Does your patient have an abnormal blood glucose today? No   Since previous encounter, have you referred patient for abnormal blood glucose that resulted in a new diagnosis or medication change? No   Was  there a life-saving intervention made? No     BP check. Client states that his SBP usually is 160-170 but he has not had access to medication. Noted clients's hands are edematous. CN directed client to go to [facility] for care and to bring any prescriptions back to clinic for medications assistance.

## 2016-08-19 NOTE — Congregational Nurse Program (Signed)
Congregational Nurse Program Note  Date of Encounter: 07/30/2016  Past Medical History: Past Medical History:  Diagnosis Date  . Anginal pain (HCC)   . Bipolar disorder (HCC)   . Depression   . ETOH abuse   . H/O cocaine abuse   . Hypercholesterolemia   . Hypertension   . PAF (paroxysmal atrial fibrillation) (HCC)    [name]/notes 12/21/2014  . Stroke [facility](HCC) 2004   pt sts about 12 years ago.   . Tobacco abuse   . Type II diabetes mellitus (HCC) dx'd 12/21/2014    Encounter Details:     CNP Questionnaire - 07/30/16 0945      Patient Demographics   Is this a new or existing patient? Existing   Patient is considered a/an Not Applicable   Race African-American/Black     Patient Assistance   Location of Patient Assistance [facility]   Patient's financial/insurance status Self-Pay (Uninsured);Low Income   Uninsured Patient ([employer], [employer]Card/Care Connects) Yes   Interventions Appt. has been completed   Patient referred to apply for the following financial assistance Not Applicable   Food insecurities addressed Not Applicable   Transportation assistance No   Type of Assistance Other   Assistance securing medications No   Educational health offerings Navigating the healthcare system;Safety     Encounter Details   Primary purpose of visit Education/Health Concerns;Post PCP Visit   Was an Emergency Department visit averted? Not Applicable   Does patient have a medical provider? Yes   Patient referred to Follow up with established PCP   Was a mental health screening completed? (GAINS tool) No   Does patient have dental issues? Yes   Was a dental referral made? Yes   Does patient have vision issues? No   Does your patient have an abnormal blood pressure today? No   Since previous encounter, have you referred patient for abnormal blood pressure that resulted in a new diagnosis or medication change? No   Does your patient have an abnormal blood glucose today? No   Since previous encounter, have you  referred patient for abnormal blood glucose that resulted in a new diagnosis or medication change? No   Was there a life-saving intervention made? No     Client declines BP check.States that [facility] and [facility]. Client has applied for [employer] and would like dental referral. [facility] notified of need for dental referral.

## 2016-08-24 ENCOUNTER — Encounter: Payer: Self-pay | Admitting: Pediatric Intensive Care

## 2016-08-31 ENCOUNTER — Encounter: Payer: Self-pay | Admitting: Pediatric Intensive Care

## 2016-09-23 NOTE — Congregational Nurse Program (Signed)
Congregational Nurse Program Note  Date of Encounter: 08/24/2016  Past Medical History: Past Medical History:  Diagnosis Date  . Anginal pain (HCC)   . Bipolar disorder (HCC)   . Depression   . ETOH abuse   . H/O cocaine abuse   . Hypercholesterolemia   . Hypertension   . PAF (paroxysmal atrial fibrillation) (HCC)    [name] 12/21/2014  . Stroke [facility]) 2004   pt sts about 12 years ago.   . Tobacco abuse   . Type II diabetes mellitus (HCC) dx'd 12/21/2014    Encounter Details:     CNP Questionnaire - 08/31/16 0900      Patient Demographics   Is this a new or existing patient? Existing   Patient is considered a/an Not Applicable   Race African-American/Black     Patient Assistance   Location of Patient Assistance [facility]   Patient's financial/insurance status Self-Pay (Uninsured);Low Income   Uninsured Patient ([employer] Card/Care Connects) Yes   Interventions Counseled to make appt. with provider;Assisted patient in making appt.   Patient referred to apply for the following financial assistance [facility];Medicaid   Food insecurities addressed Not Applicable   Transportation assistance No   Assistance securing medications No   Educational health offerings Hypertension;Navigating the healthcare system     Encounter Details   Primary purpose of visit Chronic Illness/Condition Visit;Navigating the Healthcare System   Was an Emergency Department visit averted? Not Applicable   Does patient have a medical provider? Yes   Patient referred to Follow up with established PCP   Was a mental health screening completed? (GAINS tool) No   Does patient have dental issues? No   Does patient have vision issues? No   Does your patient have an abnormal blood pressure today? Yes   Since previous encounter, have you referred patient for abnormal blood pressure that resulted in a new diagnosis or medication change? Yes   Does your patient have an abnormal blood glucose today? No   Since previous encounter, have you referred patient for abnormal blood glucose that resulted in a new diagnosis or medication change? No   Was there a life-saving intervention made? No     Post PCP visit. States he has medication at [facility]. Will pick up today.BP check.

## 2016-09-23 NOTE — Congregational Nurse Program (Signed)
Congregational Nurse Program Note  Date of Encounter: 08/31/2016  Past Medical History: Past Medical History:  Diagnosis Date  . Anginal pain (HCC)   . Bipolar disorder (HCC)   . Depression   . ETOH abuse   . H/O cocaine abuse   . Hypercholesterolemia   . Hypertension   . PAF (paroxysmal atrial fibrillation) (HCC)    [name] 12/21/2014  . Stroke [facility]) 2004   pt sts about 12 years ago.   . Tobacco abuse   . Type II diabetes mellitus (HCC) dx'd 12/21/2014    Encounter Details:     CNP Questionnaire - 08/31/16 0900      Patient Demographics   Is this a new or existing patient? Existing   Patient is considered a/an Not Applicable   Race African-American/Black     Patient Assistance   Location of Patient Assistance [facility]   Patient's financial/insurance status Self-Pay (Uninsured);Low Income   Uninsured Patient ([employer] Card/Care Connects) Yes   Interventions Counseled to make appt. with provider;Assisted patient in making appt.   Patient referred to apply for the following financial assistance [facility];Medicaid   Food insecurities addressed Not Applicable   Transportation assistance No   Assistance securing medications No   Educational health offerings Hypertension;Navigating the healthcare system     Encounter Details   Primary purpose of visit Chronic Illness/Condition Visit;Navigating the Healthcare System   Was an Emergency Department visit averted? Not Applicable   Does patient have a medical provider? Yes   Patient referred to Follow up with established PCP   Was a mental health screening completed? (GAINS tool) No   Does patient have dental issues? No   Does patient have vision issues? No   Does your patient have an abnormal blood pressure today? Yes   Since previous encounter, have you referred patient for abnormal blood pressure that resulted in a new diagnosis or medication change? Yes   Does your patient have an abnormal blood glucose today? No   Since previous encounter, have you referred patient for abnormal blood glucose that resulted in a new diagnosis or medication change? No   Was there a life-saving intervention made? No     BP check. Client states he is taking his medication regularly. Advised to follow up with PCP regarding continued elevated BP.

## 2016-10-01 ENCOUNTER — Encounter: Payer: Self-pay | Admitting: Pediatric Intensive Care

## 2016-10-15 ENCOUNTER — Emergency Department (HOSPITAL_COMMUNITY): Payer: Self-pay

## 2016-10-15 ENCOUNTER — Emergency Department (HOSPITAL_COMMUNITY)
Admission: EM | Admit: 2016-10-15 | Discharge: 2016-10-15 | Disposition: A | Discharge status: Home / Self Care | Payer: Self-pay | Attending: Emergency Medicine | Admitting: Emergency Medicine

## 2016-10-15 ENCOUNTER — Encounter: Payer: Self-pay | Admitting: Pediatric Intensive Care

## 2016-10-15 ENCOUNTER — Encounter (HOSPITAL_COMMUNITY): Payer: Self-pay

## 2016-10-15 DIAGNOSIS — R1031 Right lower quadrant pain: Secondary | ICD-10-CM | POA: Insufficient documentation

## 2016-10-15 DIAGNOSIS — Z79899 Other long term (current) drug therapy: Secondary | ICD-10-CM | POA: Insufficient documentation

## 2016-10-15 DIAGNOSIS — Z8673 Personal history of transient ischemic attack (TIA), and cerebral infarction without residual deficits: Secondary | ICD-10-CM | POA: Insufficient documentation

## 2016-10-15 DIAGNOSIS — Z7901 Long term (current) use of anticoagulants: Secondary | ICD-10-CM | POA: Insufficient documentation

## 2016-10-15 DIAGNOSIS — R109 Unspecified abdominal pain: Secondary | ICD-10-CM

## 2016-10-15 DIAGNOSIS — R1032 Left lower quadrant pain: Secondary | ICD-10-CM | POA: Insufficient documentation

## 2016-10-15 DIAGNOSIS — M25562 Pain in left knee: Secondary | ICD-10-CM | POA: Insufficient documentation

## 2016-10-15 DIAGNOSIS — I2510 Atherosclerotic heart disease of native coronary artery without angina pectoris: Secondary | ICD-10-CM | POA: Insufficient documentation

## 2016-10-15 DIAGNOSIS — M25561 Pain in right knee: Secondary | ICD-10-CM

## 2016-10-15 DIAGNOSIS — R1033 Periumbilical pain: Secondary | ICD-10-CM | POA: Insufficient documentation

## 2016-10-15 DIAGNOSIS — R079 Chest pain, unspecified: Secondary | ICD-10-CM

## 2016-10-15 DIAGNOSIS — E119 Type 2 diabetes mellitus without complications: Secondary | ICD-10-CM | POA: Insufficient documentation

## 2016-10-15 DIAGNOSIS — I10 Essential (primary) hypertension: Secondary | ICD-10-CM | POA: Insufficient documentation

## 2016-10-15 DIAGNOSIS — F17210 Nicotine dependence, cigarettes, uncomplicated: Secondary | ICD-10-CM | POA: Insufficient documentation

## 2016-10-15 LAB — CBC
HEMATOCRIT: 49.7 % (ref 39.0–52.0)
Hemoglobin: 16.5 g/dL (ref 13.0–17.0)
MCH: 30.4 pg (ref 26.0–34.0)
MCHC: 33.2 g/dL (ref 30.0–36.0)
MCV: 91.5 fL (ref 78.0–100.0)
PLATELETS: 250 10*3/uL (ref 150–400)
RBC: 5.43 MIL/uL (ref 4.22–5.81)
RDW: 13.5 % (ref 11.5–15.5)
WBC: 18 10*3/uL — ABNORMAL HIGH (ref 4.0–10.5)

## 2016-10-15 LAB — HEPATIC FUNCTION PANEL
ALBUMIN: 4.5 g/dL (ref 3.5–5.0)
ALK PHOS: 68 U/L (ref 38–126)
ALT: 12 U/L — AB (ref 17–63)
AST: 19 U/L (ref 15–41)
Bilirubin, Direct: 0.2 mg/dL (ref 0.1–0.5)
Indirect Bilirubin: 0.6 mg/dL (ref 0.3–0.9)
TOTAL PROTEIN: 7.5 g/dL (ref 6.5–8.1)
Total Bilirubin: 0.8 mg/dL (ref 0.3–1.2)

## 2016-10-15 LAB — BASIC METABOLIC PANEL
Anion gap: 9 (ref 5–15)
BUN: 7 mg/dL (ref 6–20)
CO2: 27 mmol/L (ref 22–32)
CREATININE: 0.8 mg/dL (ref 0.61–1.24)
Calcium: 9.3 mg/dL (ref 8.9–10.3)
Chloride: 101 mmol/L (ref 101–111)
GFR calc non Af Amer: >60 mL/min (ref >60)
Glucose, Bld: 94 mg/dL (ref 65–99)
Potassium: 4 mmol/L (ref 3.5–5.1)
Sodium: 137 mmol/L (ref 135–145)

## 2016-10-15 LAB — LIPASE, BLOOD: Lipase: 18 U/L (ref 11–51)

## 2016-10-15 LAB — URINALYSIS, ROUTINE W REFLEX MICROSCOPIC
Bilirubin Urine: NEGATIVE
Glucose, UA: NEGATIVE mg/dL
HGB URINE DIPSTICK: NEGATIVE
Ketones, ur: NEGATIVE mg/dL
LEUKOCYTES UA: NEGATIVE
NITRITE: NEGATIVE
Protein, ur: NEGATIVE mg/dL
SPECIFIC GRAVITY, URINE: 1.003 — AB (ref 1.005–1.030)
pH: 5 (ref 5.0–8.0)

## 2016-10-15 LAB — I-STAT TROPONIN, ED
Troponin i, poc: 0 ng/mL (ref 0.00–0.08)
Troponin i, poc: 0 ng/mL (ref 0.00–0.08)

## 2016-10-15 LAB — I-STAT CG4 LACTIC ACID, ED: Lactic Acid, Venous: 1.3 mmol/L (ref 0.5–1.9)

## 2016-10-15 MED ORDER — SODIUM CHLORIDE 0.9 % IV BOLUS (SEPSIS)
1000.0000 mL | Freq: Once | INTRAVENOUS | Status: AC
  Administered 2016-10-15: 1000 mL via INTRAVENOUS

## 2016-10-15 MED ORDER — ONDANSETRON HCL 4 MG/2ML IJ SOLN
4.0000 mg | Freq: Once | INTRAMUSCULAR | Status: AC
  Administered 2016-10-15: 4 mg via INTRAVENOUS
  Filled 2016-10-15: qty 2

## 2016-10-15 MED ORDER — KETOROLAC TROMETHAMINE 30 MG/ML IJ SOLN
10.0000 mg | Freq: Once | INTRAMUSCULAR | Status: AC
  Administered 2016-10-15: 9.9 mg via INTRAVENOUS
  Filled 2016-10-15: qty 1

## 2016-10-15 MED ORDER — IOPAMIDOL (ISOVUE-300) INJECTION 61%
INTRAVENOUS | Status: AC
  Administered 2016-10-15: 100 mL
  Filled 2016-10-15: qty 100

## 2016-10-15 NOTE — ED Provider Notes (Signed)
[facility]-EMERGENCY DEPT Provider Note   CSN: [id number] Arrival date & time: 10/15/16  1413     History   Chief Complaint Chief Complaint  Patient presents with  . Abdominal Pain    HPI Gary Buck is a 51 y.o. male.  The history is provided by the patient.  Abdominal Pain   This is a new problem. The current episode started 6 to 12 hours ago. The problem occurs constantly. The problem has not changed since onset.The pain is located in the epigastric region and chest. The pain is at a severity of 6/10. The pain is moderate. Associated symptoms include nausea, arthralgias and myalgias. Pertinent negatives include anorexia, fever, belching, diarrhea, flatus, hematochezia, melena, vomiting, constipation, dysuria, frequency, hematuria and headaches. The symptoms are aggravated by palpation. Nothing relieves the symptoms. Past workup does not include ultrasound or surgery. His past medical history does not include PUD, ulcerative colitis or Crohn's disease.    Past Medical History:  Diagnosis Date  . Anginal pain (HCC)   . Bipolar disorder (HCC)   . Depression   . ETOH abuse   . H/O cocaine abuse   . Hypercholesterolemia   . Hypertension   . PAF (paroxysmal atrial fibrillation) (HCC)    [name] 12/21/2014  . Stroke [facility]) 2004   pt sts about 12 years ago.   . Tobacco abuse   . Type II diabetes mellitus (HCC) dx'd 12/21/2014    Patient Active Problem List   Diagnosis Date Noted  . Bipolar affective disorder, mixed, severe (HCC) 06/28/2016  . Bipolar affective disorder, current episode mixed, without psychotic features (HCC) 06/28/2016  . Atrial fibrillation, unspecified   . Elevated troponin   . Atrial fibrillation with RVR (HCC) 12/21/2014  . CAD (coronary artery disease) 12/21/2014  . Chest pain   . Essential hypertension   . Remote history of stroke   . Polysubstance abuse     Past Surgical History:  Procedure Laterality Date  . CYSTECTOMY  ~ 2004   "on the top of my  forehead"  . INGUINAL HERNIA REPAIR Left ~ 2012       Home Medications    Prior to Admission medications   Medication Sig Start Date End Date Taking? Authorizing Provider  apixaban (ELIQUIS) 5 MG TABS tablet Take 1 tablet (5 mg total) by mouth 2 (two) times daily. 07/20/16   Gwyneth Sprout, MD  atorvastatin (LIPITOR) 10 MG tablet Take 1 tablet (10 mg total) by mouth daily at 6 PM. 07/20/16   Gwyneth Sprout, MD  diltiazem (CARDIZEM CD) 180 MG 24 hr capsule Take 1 capsule (180 mg total) by mouth daily. 07/20/16   Gwyneth Sprout, MD  ibuprofen (ADVIL,MOTRIN) 200 MG tablet Take 200 mg by mouth every 6 (six) hours as needed for moderate pain.    Historical Provider, MD  lisinopril (PRINIVIL,ZESTRIL) 40 MG tablet Take 1 tablet (40 mg total) by mouth daily. 07/20/16   Gwyneth Sprout, MD  metoprolol tartrate (LOPRESSOR) 25 MG tablet Take 1 tablet (25 mg total) by mouth 2 (two) times daily. 07/20/16   Gwyneth Sprout, MD  nicotine (NICODERM CQ - DOSED IN MG/24 HOURS) 21 mg/24hr patch Place 1 patch (21 mg total) onto the skin daily. 07/04/16   Adonis Brook, NP  Oxcarbazepine (TRILEPTAL) 300 MG tablet Take 1 tablet (300 mg total) by mouth 2 (two) times daily. 07/20/16   Gwyneth Sprout, MD  risperiDONE (RISPERDAL) 1 MG tablet Take 1 tablet (1 mg total) by mouth 2 (two) times daily. 07/20/16  Gwyneth Sprout, MD  thiamine 100 MG tablet Take 1 tablet (100 mg total) by mouth daily. 07/04/16   Adonis Brook, NP    Family History Family History  Problem Relation Age of Onset  . Diabetes Mellitus II Mother     Social History Social History  Substance Use Topics  . Smoking status: Current Every Day Smoker    Packs/day: 0.50    Years: 32.00    Types: Cigarettes  . Smokeless tobacco: Never Used  . Alcohol use 32.4 oz/week    54 Cans of beer per week     Comment: 12/21/2014 "2-3, 24oz  beers per night during work nights, 6 24oz beer per day on the weekends"     Allergies   Patient has no known  allergies.   Review of Systems Review of Systems  Constitutional: Positive for appetite change. Negative for chills and fever.  HENT: Negative for ear pain and sore throat.   Eyes: Negative for pain and visual disturbance.  Respiratory: Negative for cough and shortness of breath.   Cardiovascular: Positive for chest pain. Negative for palpitations.  Gastrointestinal: Positive for abdominal pain and nausea. Negative for anorexia, constipation, diarrhea, flatus, hematochezia, melena and vomiting.  Genitourinary: Negative for dysuria, frequency and hematuria.  Musculoskeletal: Positive for arthralgias, back pain and myalgias.  Skin: Negative for color change and rash.  Neurological: Negative for seizures, syncope and headaches.  Psychiatric/Behavioral: Negative for dysphoric mood.  All other systems reviewed and are negative.    Physical Exam Updated Vital Signs BP (!) 172/86   Pulse 79   Temp 99.9 F (37.7 C) (Oral)   Resp 17   Ht  (1.956 m)   Wt (!) 147.4 kg   SpO2 98%   BMI 38.54 kg/m   Physical Exam  Constitutional: He appears well-developed and well-nourished.  HENT:  Head: Normocephalic and atraumatic.  Mouth/Throat: Oropharynx is clear and moist.  Eyes: Conjunctivae and EOM are normal.  Neck: Neck supple.  Cardiovascular: Normal rate, regular rhythm and intact distal pulses.   No murmur heard. Pulmonary/Chest: Effort normal and breath sounds normal. No respiratory distress.  Abdominal: Soft. There is tenderness in the right lower quadrant, periumbilical area, suprapubic area and left lower quadrant. There is no rigidity, no guarding and no CVA tenderness.  Musculoskeletal: He exhibits no edema.       Right knee: He exhibits normal range of motion, no swelling, no effusion, no deformity, no laceration, no erythema and normal alignment. Tenderness found.       Left knee: He exhibits normal range of motion, no swelling, no effusion, no ecchymosis, no laceration, no  erythema and normal alignment. Tenderness found.  Neurological: He is alert. No cranial nerve deficit or sensory deficit. GCS eye subscore is 4. GCS verbal subscore is 5. GCS motor subscore is 6.  Skin: Skin is warm and dry. No rash noted.  Psychiatric: He has a normal mood and affect. His speech is normal and behavior is normal.  Nursing note and vitals reviewed.    ED Treatments / Results  Labs (all labs ordered are listed, but only abnormal results are displayed) Labs Reviewed  CBC - Abnormal; Notable for the following:       Result Value   WBC 18.0 (*)    All other components within normal limits  URINALYSIS, ROUTINE W REFLEX MICROSCOPIC - Abnormal; Notable for the following:    Color, Urine STRAW (*)    Specific Gravity, Urine 1.003 (*)  All other components within normal limits  HEPATIC FUNCTION PANEL - Abnormal; Notable for the following:    ALT 12 (*)    All other components within normal limits  BASIC METABOLIC PANEL  LIPASE, BLOOD  I-STAT TROPOININ, ED  I-STAT CG4 LACTIC ACID, ED  I-STAT TROPOININ, ED    EKG  EKG Interpretation  Date/Time:  Monday October 15 2016 14:17:02 EDT Ventricular Rate:  86 PR Interval:    QRS Duration: 117 QT Interval:  376 QTC Calculation: 450 R Axis:   -22 Text Interpretation:  Sinus rhythm Left atrial enlargement Left ventricular hypertrophy v1, v3 ST elevation - not new Lateral leads are also involved No acute changes Confirmed by Rhunette Croft, MD, Janey Genta [phone]) on 10/15/2016 5:01:23 PM       Radiology Dg Chest 2 View  Result Date: 10/15/2016 CLINICAL DATA:  Epigastric pain EXAM: CHEST  2 VIEW COMPARISON:  08/02/2015 FINDINGS: Heart size is within normal limits. Negative aortic and hilar contours. There is no edema, consolidation, effusion, or pneumothorax. EKG leads create artifact over the chest. IMPRESSION: No evidence of acute disease. Electronically Signed   By: Marnee Spring M.D.   On: 10/15/2016 15:01   Ct Abdomen Pelvis W  Contrast  Result Date: 10/15/2016 CLINICAL DATA:  Mid epigastric abdominal pain with nausea since last night. EXAM: CT ABDOMEN AND PELVIS WITH CONTRAST TECHNIQUE: Multidetector CT imaging of the abdomen and pelvis was performed using the standard protocol following bolus administration of intravenous contrast. CONTRAST:  ISOVUE-300 IOPAMIDOL (ISOVUE-300) INJECTION 61% COMPARISON:  None. FINDINGS: Lower chest:  Unremarkable. Hepatobiliary: No focal abnormality within the liver parenchyma. There is no evidence for gallstones, gallbladder wall thickening, or pericholecystic fluid. No intrahepatic or extrahepatic biliary dilation. Pancreas: No focal mass lesion. No dilatation of the main duct. No intraparenchymal cyst. No peripancreatic edema. Spleen: No splenomegaly. No focal mass lesion. Adrenals/Urinary Tract: Right adrenal gland unremarkable. 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 Kidneys are unremarkable without hydronephrosis. No evidence for hydroureter. The urinary bladder appears normal for the degree of distention. Stomach/Bowel: Stomach is nondistended. No gastric wall thickening. No evidence of outlet obstruction. Duodenum is normally positioned as is the ligament of Treitz. No small bowel wall thickening. No small bowel dilatation. The terminal ileum is normal. The appendix is normal. No gross colonic mass. No colonic wall thickening. No substantial diverticular change. Vascular/Lymphatic: There is abdominal aortic atherosclerosis without aneurysm. There is no gastrohepatic or hepatoduodenal ligament lymphadenopathy. No intraperitoneal or retroperitoneal lymphadenopathy. Reproductive: The prostate gland and seminal vesicles have normal imaging features. Other: No intraperitoneal free fluid. Musculoskeletal: Bilateral groin hernias contain only fat. Bone windows reveal no worrisome lytic or sclerotic osseous lesions. IMPRESSION: 1. No acute findings in the abdomen or pelvis. 2. 3.8 cm left  adrenal mass cannot be definitively characterized on this study as patient agitation precluded acquisition of delayed imaging. As metastatic disease or even pheochromocytoma could have this appearance, further evaluation is recommended. Nonemergent, outpatient MRI with in and out of phase imaging may prove helpful to further evaluate. 3. Bilateral small groin hernias contain only fat. Electronically Signed   By: Kennith Center M.D.   On: 10/15/2016 17:11    Procedures Procedures (including critical care time)  Medications Ordered in ED Medications  sodium chloride 0.9 % bolus 1,000 mL (0 mLs Intravenous Stopped 10/15/16 1714)  ondansetron (ZOFRAN) injection 4 mg (4 mg Intravenous Given 10/15/16 1540)  ketorolac (TORADOL) 30 MG/ML injection 9.9 mg (9.9 mg Intravenous Given 10/15/16  1540)  iopamidol (ISOVUE-300) 61 % injection (100 mLs  Contrast Given 10/15/16 1618)     Initial Impression / Assessment and Plan / ED Course  I have reviewed the triage vital signs and the nursing notes.  Pertinent labs & imaging results that were available during my care of the patient were reviewed by me and considered in my medical decision making (see chart for details).     51 year old male with history of bipolar disorder, polysubstance abuse, CAD, A. fib who presents in the setting of multiple complaints. Patient reports his knees of been hurting bilaterally for last several days. Patient denies any trauma or swelling. He additionally reports her on glycol abdominal pain. He reports is a deep pain. Patient denies nausea, vomiting, constant, diarrhea. He additionally endorses chest pain intermittently over the last several days. For this he came to the emergency department.  Initially patient was a code STEMI and was evaluated quickly by cardiology. Patient not having chest pain on arrival and EKG unchanged from previous EKGs. At this time cardiology as low suspicion for STEMI and code STEMI was deactivated.  Patient had troponin 2 obtained which were not elevated. EKG without change from prior. I have very low suspicion for ACS and believe patient is safe for outpatient follow up with cardiologist. Patient with no significant risk factors for pulmonary embolus and presentation not consistent with PE. No signs of pneumonia on chest x-ray. Intra-abdominal laboratory analysis without significant abnormalities and CT scan revealed incidental adrenal finding which patient was made aware of and no other significant abnormalities. Patient able to tolerate by mouth intake and emergency department without competitions. Examination of knees bilaterally reveal no significant abnormality's and patient able and lay without competition. Given Toradol with improvement in pain. Patient did have slight elevation in white blood cell count but no other signs of infection on labs or imaging.  U/A without signs of UTI.  Pts temperature elevated but he is afebrile.  No tachycardia, hypotension, lactic acidosis or other signs of sepsis or significant infection.  At this time believe patient is safe for discharge home with plan for over-the-counter pain medication and follow up with PCP for further management of multiple complaints as well as follow-up for incidental finding on imaging. Patient given copy of finding and strict return precautions. Stable at time of discharge.  Final Clinical Impressions(s) / ED Diagnoses   Final diagnoses:  Abdominal pain, unspecified abdominal location  Chest pain, unspecified type  Acute pain of both knees    New Prescriptions New Prescriptions   No medications on file     Stacy Gardner, MD 10/15/16 1828    Stacy Gardner, MD 10/15/16 1610    Derwood Kaplan, MD 10/16/16 [id number]

## 2016-10-15 NOTE — ED Notes (Signed)
ED Provider at bedside. 

## 2016-10-15 NOTE — Congregational Nurse Program (Signed)
Congregational Nurse Program Note  Date of Encounter: 10/15/2016  Past Medical History: Past Medical History:  Diagnosis Date  . Anginal pain (HCC)   . Bipolar disorder (HCC)   . Depression   . ETOH abuse   . H/O cocaine abuse   . Hypercholesterolemia   . Hypertension   . PAF (paroxysmal atrial fibrillation) (HCC)    [name] 12/21/2014  . Stroke [facility]) 2004   pt sts about 12 years ago.   . Tobacco abuse   . Type II diabetes mellitus (HCC) dx'd 12/21/2014    Encounter Details:     CNP Questionnaire - 10/15/16 1000      Patient Demographics   Is this a new or existing patient? New   Patient is considered a/an Not Applicable   Race African-American/Black     Patient Assistance   Location of Patient Assistance [facility]   Patient's financial/insurance status Low Income;Self-Pay (Uninsured)   Uninsured Patient ([employer], trade union) Yes   Interventions Not Applicable   Patient referred to apply for the following financial assistance [employer] insecurities addressed Not Applicable   Transportation assistance No   Assistance securing medications No   Educational health offerings Hypertension     Encounter Details   Primary purpose of visit Chronic Illness/Condition Visit   Was an Emergency Department visit averted? Not Applicable   Does patient have a medical provider? Yes   Patient referred to Follow up with established PCP   Was a mental health screening completed? (GAINS tool) No   Does patient have dental issues? No   Does patient have vision issues? No   Does your patient have an abnormal blood pressure today? Yes   Since previous encounter, have you referred patient for abnormal blood pressure that resulted in a new diagnosis or medication change? No   Does your patient have an abnormal blood glucose today? No   Since previous encounter, have you referred patient for abnormal blood glucose that resulted in a new diagnosis or medication change? No    Was there a life-saving intervention made? No     BP check. Client states he has appointment at [facility] in 2 weeks.

## 2016-10-15 NOTE — Discharge Instructions (Signed)
3.8 cm left adrenal mass cannot be definitively characterized on  this study as patient agitation precluded acquisition of delayed  imaging. As metastatic disease or even pheochromocytoma could have  this appearance, further evaluation is recommended. Nonemergent,  outpatient MRI with in and out of phase imaging may prove helpful to  further evaluate.

## 2016-10-15 NOTE — ED Triage Notes (Signed)
Pt received 2 NTG and  ASA pta

## 2016-10-15 NOTE — ED Triage Notes (Signed)
Pt arrives via EMS with complaints of epigastric pain x 3-4 hours. Pt Denies chest pain/ sob/ diaphoresis. Pt endorses n/v x 2. Last BM this morning. Pt initial EKG shows some ST elevation but no significant change when compared to past EKG. Code stemi cancelled.

## 2016-10-22 NOTE — Congregational Nurse Program (Signed)
Congregational Nurse Program Note  Date of Encounter: 10/01/2016  Past Medical History: Past Medical History:  Diagnosis Date  . Anginal pain (HCC)   . Bipolar disorder (HCC)   . Depression   . ETOH abuse   . H/O cocaine abuse   . Hypercholesterolemia   . Hypertension   . PAF (paroxysmal atrial fibrillation) (HCC)    [name]/notes 12/21/2014  . Stroke [facility](HCC) 2004   pt sts about 12 years ago.   . Tobacco abuse   . Type II diabetes mellitus (HCC) dx'd 12/21/2014    Encounter Details:     CNP Questionnaire - 10/15/16 1000      Patient Demographics   Is this a new or existing patient? New   Patient is considered a/an Not Applicable   Race African-American/Black     Patient Assistance   Location of Patient Assistance [facility]   Patient's financial/insurance status Low Income;Self-Pay (Uninsured)   Uninsured Patient ([employer], trade unionCard/Care Connects) Yes   Interventions Not Applicable   Patient referred to apply for the following financial assistance [employer]/Care Connects   Food insecurities addressed Not Applicable   Transportation assistance No   Assistance securing medications No   Educational health offerings Hypertension     Encounter Details   Primary purpose of visit Chronic Illness/Condition Visit   Was an Emergency Department visit averted? Not Applicable   Does patient have a medical provider? Yes   Patient referred to Follow up with established PCP   Was a mental health screening completed? (GAINS tool) No   Does patient have dental issues? No   Does patient have vision issues? No   Does your patient have an abnormal blood pressure today? Yes   Since previous encounter, have you referred patient for abnormal blood pressure that resulted in a new diagnosis or medication change? No   Does your patient have an abnormal blood glucose today? No   Since previous encounter, have you referred patient for abnormal blood glucose that resulted in a new diagnosis or medication change? No    Was there a life-saving intervention made? No     Clients states that he's very upset this morning and stressed out. States that he just smoked a cigarette. States that he took his BP medication this morning. CN advised waiting 20 minutes without smoking and come back for BP check. CN also advised follow up with PCP for BP check. Client states he will return after he finishes lunch.

## 2016-10-29 ENCOUNTER — Encounter: Payer: Self-pay | Admitting: Pediatric Intensive Care

## 2016-11-02 ENCOUNTER — Encounter: Payer: Self-pay | Admitting: Pediatric Intensive Care

## 2016-11-02 NOTE — Congregational Nurse Program (Signed)
Congregational Nurse Program Note  Date of Encounter: 11/02/2016  Past Medical History: Past Medical History:  Diagnosis Date  . Anginal pain (HCC)   . Bipolar disorder (HCC)   . Depression   . ETOH abuse   . H/O cocaine abuse   . Hypercholesterolemia   . Hypertension   . PAF (paroxysmal atrial fibrillation) (HCC)    [name]/notes 12/21/2014  . Stroke [facility](HCC) 2004   pt sts about 12 years ago.   . Tobacco abuse   . Type II diabetes mellitus (HCC) dx'd 12/21/2014    Encounter Details:     CNP Questionnaire - 11/02/16 0900      Patient Demographics   Is this a new or existing patient? Existing   Patient is considered a/an Not Applicable   Race African-American/Black     Patient Assistance   Location of Patient Assistance [facility]   Patient's financial/insurance status Self-Pay (Uninsured);Low Income   Uninsured Patient ([employer], trade unionCard/Care Connects) Yes   Interventions Referred to ED/Urgent Care   Patient referred to apply for the following financial assistance Not Applicable   Food insecurities addressed Not Applicable   Transportation assistance Yes   Type of Assistance [place] Given   Assistance securing medications No   Educational health offerings Hypertension     Encounter Details   Primary purpose of visit Navigating the Healthcare System;Chronic Illness/Condition Visit   Was an Emergency Department visit averted? Not Applicable   Does patient have a medical provider? Yes   Patient referred to Emergency Department   Was a mental health screening completed? (GAINS tool) No   Does patient have dental issues? No   Does patient have vision issues? No   Does your patient have an abnormal blood pressure today? Yes   Since previous encounter, have you referred patient for abnormal blood pressure that resulted in a new diagnosis or medication change? No   Does your patient have an abnormal blood glucose today? No   Since previous encounter, have you referred patient for abnormal blood  glucose that resulted in a new diagnosis or medication change? No   Was there a life-saving intervention made? No     BP check- CN advised going to ED immediately. Client states he doesn't want to go. Client denies chest pain, blurred vision, nausea/vomiting. CN reviewed stoke sign and symptoms. Client states he will go to ED if he feels like he is having cardiac/stoke symptoms.

## 2016-11-02 NOTE — Congregational Nurse Program (Signed)
Congregational Nurse Program Note  Date of Encounter: 10/29/2016  Past Medical History: Past Medical History:  Diagnosis Date  . Anginal pain (HCC)   . Bipolar disorder (HCC)   . Depression   . ETOH abuse   . H/O cocaine abuse   . Hypercholesterolemia   . Hypertension   . PAF (paroxysmal atrial fibrillation) (HCC)    Gary Buck/notes 12/21/2014  . Stroke [facility](HCC) 2004   pt sts about 12 years ago.   . Tobacco abuse   . Type II diabetes mellitus (HCC) dx'd 12/21/2014    Encounter Details:     CNP Questionnaire - 10/29/16 0900      Patient Demographics   Is this a new or existing patient? Existing   Patient is considered a/an Not Applicable   Race African-American/Black     Patient Assistance   Location of Patient Assistance [facility]   Patient's financial/insurance status Self-Pay (Uninsured);Low Income   Uninsured Patient ([employer] Card/Care Connects) Yes   Interventions Counseled to make appt. with provider   Patient referred to apply for the following financial assistance [employer]/Care Connects   Food insecurities addressed Not Applicable   Transportation assistance No   Assistance securing medications No   Educational health offerings Hypertension     Encounter Details   Primary purpose of visit Chronic Illness/Condition Visit   Was an Emergency Department visit averted? Not Applicable   Does patient have a medical provider? Yes   Patient referred to Follow up with established PCP   Was a mental health screening completed? (GAINS tool) No   Does patient have dental issues? No   Does patient have vision issues? No   Does your patient have an abnormal blood pressure today? Yes   Since previous encounter, have you referred patient for abnormal blood pressure that resulted in a new diagnosis or medication change? No   Does your patient have an abnormal blood glucose today? No   Since previous encounter, have you referred patient for abnormal blood glucose that resulted in a new  diagnosis or medication change? No   Was there a life-saving intervention made? No    BP check- CN advised client see PCP as soon as possible due to continued increased BP.

## 2016-11-07 LAB — GLUCOSE, POCT (MANUAL RESULT ENTRY): POC GLUCOSE: 136 mg/dL — AB (ref 70–99)

## 2016-11-17 ENCOUNTER — Encounter (HOSPITAL_COMMUNITY): Payer: Self-pay

## 2016-11-17 ENCOUNTER — Observation Stay (HOSPITAL_COMMUNITY)
Admission: EM | Admit: 2016-11-17 | Discharge: 2016-11-18 | Disposition: A | Discharge status: Home / Self Care | Payer: Self-pay | Attending: Internal Medicine | Admitting: Internal Medicine

## 2016-11-17 ENCOUNTER — Emergency Department (HOSPITAL_COMMUNITY): Payer: Self-pay

## 2016-11-17 DIAGNOSIS — F17210 Nicotine dependence, cigarettes, uncomplicated: Secondary | ICD-10-CM | POA: Insufficient documentation

## 2016-11-17 DIAGNOSIS — F1910 Other psychoactive substance abuse, uncomplicated: Secondary | ICD-10-CM | POA: Diagnosis present

## 2016-11-17 DIAGNOSIS — F1010 Alcohol abuse, uncomplicated: Secondary | ICD-10-CM | POA: Diagnosis present

## 2016-11-17 DIAGNOSIS — Z79899 Other long term (current) drug therapy: Secondary | ICD-10-CM | POA: Insufficient documentation

## 2016-11-17 DIAGNOSIS — F319 Bipolar disorder, unspecified: Secondary | ICD-10-CM | POA: Diagnosis present

## 2016-11-17 DIAGNOSIS — I10 Essential (primary) hypertension: Secondary | ICD-10-CM | POA: Diagnosis present

## 2016-11-17 DIAGNOSIS — I480 Paroxysmal atrial fibrillation: Secondary | ICD-10-CM | POA: Diagnosis present

## 2016-11-17 DIAGNOSIS — R079 Chest pain, unspecified: Secondary | ICD-10-CM | POA: Diagnosis present

## 2016-11-17 DIAGNOSIS — E669 Obesity, unspecified: Secondary | ICD-10-CM | POA: Diagnosis present

## 2016-11-17 DIAGNOSIS — Z720 Tobacco use: Secondary | ICD-10-CM | POA: Diagnosis present

## 2016-11-17 DIAGNOSIS — Z7901 Long term (current) use of anticoagulants: Secondary | ICD-10-CM | POA: Insufficient documentation

## 2016-11-17 DIAGNOSIS — I119 Hypertensive heart disease without heart failure: Secondary | ICD-10-CM | POA: Insufficient documentation

## 2016-11-17 DIAGNOSIS — E119 Type 2 diabetes mellitus without complications: Secondary | ICD-10-CM

## 2016-11-17 DIAGNOSIS — I200 Unstable angina: Principal | ICD-10-CM | POA: Insufficient documentation

## 2016-11-17 DIAGNOSIS — E7800 Pure hypercholesterolemia, unspecified: Secondary | ICD-10-CM | POA: Diagnosis present

## 2016-11-17 LAB — BASIC METABOLIC PANEL
ANION GAP: 12 (ref 5–15)
BUN: 12 mg/dL (ref 6–20)
CHLORIDE: 103 mmol/L (ref 101–111)
CO2: 23 mmol/L (ref 22–32)
Calcium: 8.9 mg/dL (ref 8.9–10.3)
Creatinine, Ser: 0.96 mg/dL (ref 0.61–1.24)
GFR calc Af Amer: >60 mL/min (ref >60)
GLUCOSE: 124 mg/dL — AB (ref 65–99)
POTASSIUM: 3.1 mmol/L — AB (ref 3.5–5.1)
Sodium: 138 mmol/L (ref 135–145)

## 2016-11-17 LAB — PHOSPHORUS: Phosphorus: 4.9 mg/dL — ABNORMAL HIGH (ref 2.5–4.6)

## 2016-11-17 LAB — CBC
HEMATOCRIT: 46.8 % (ref 39.0–52.0)
HEMOGLOBIN: 15.8 g/dL (ref 13.0–17.0)
MCH: 31 pg (ref 26.0–34.0)
MCHC: 33.8 g/dL (ref 30.0–36.0)
MCV: 91.8 fL (ref 78.0–100.0)
Platelets: 227 10*3/uL (ref 150–400)
RBC: 5.1 MIL/uL (ref 4.22–5.81)
RDW: 13.5 % (ref 11.5–15.5)
WBC: 13.1 10*3/uL — AB (ref 4.0–10.5)

## 2016-11-17 LAB — I-STAT TROPONIN, ED: Troponin i, poc: 0 ng/mL (ref 0.00–0.08)

## 2016-11-17 LAB — MAGNESIUM: MAGNESIUM: 2 mg/dL (ref 1.7–2.4)

## 2016-11-17 LAB — GLUCOSE, CAPILLARY: Glucose-Capillary: 122 mg/dL — ABNORMAL HIGH (ref 65–99)

## 2016-11-17 MED ORDER — POTASSIUM CHLORIDE CRYS ER 20 MEQ PO TBCR
40.0000 meq | EXTENDED_RELEASE_TABLET | Freq: Once | ORAL | Status: AC
  Administered 2016-11-17: 40 meq via ORAL
  Filled 2016-11-17: qty 2

## 2016-11-17 MED ORDER — INSULIN ASPART 100 UNIT/ML ~~LOC~~ SOLN
0.0000 [IU] | Freq: Three times a day (TID) | SUBCUTANEOUS | Status: DC
  Administered 2016-11-18: 4 [IU] via SUBCUTANEOUS

## 2016-11-17 MED ORDER — NITROGLYCERIN 2 % TD OINT
0.5000 [in_us] | TOPICAL_OINTMENT | Freq: Four times a day (QID) | TRANSDERMAL | Status: DC
  Administered 2016-11-18: 0.5 [in_us] via TOPICAL
  Filled 2016-11-17: qty 30

## 2016-11-17 MED ORDER — ENOXAPARIN SODIUM 40 MG/0.4ML ~~LOC~~ SOLN
40.0000 mg | Freq: Every day | SUBCUTANEOUS | Status: DC
  Administered 2016-11-18: 40 mg via SUBCUTANEOUS
  Filled 2016-11-17: qty 0.4

## 2016-11-17 MED ORDER — MORPHINE SULFATE (PF) 4 MG/ML IV SOLN
4.0000 mg | INTRAVENOUS | Status: DC | PRN
  Administered 2016-11-18 (×3): 4 mg via INTRAVENOUS
  Filled 2016-11-17 (×3): qty 1

## 2016-11-17 MED ORDER — ONDANSETRON HCL 4 MG/2ML IJ SOLN: 4.0000 mg | Freq: Four times a day (QID) | INTRAMUSCULAR | Status: DC | PRN

## 2016-11-17 MED ORDER — NITROGLYCERIN 2 % TD OINT
0.5000 [in_us] | TOPICAL_OINTMENT | Freq: Once | TRANSDERMAL | Status: AC
  Administered 2016-11-17: 0.5 [in_us] via TOPICAL
  Filled 2016-11-17: qty 1

## 2016-11-17 MED ORDER — MORPHINE SULFATE (PF) 4 MG/ML IV SOLN
4.0000 mg | Freq: Once | INTRAVENOUS | Status: AC
  Administered 2016-11-17: 4 mg via INTRAVENOUS
  Filled 2016-11-17: qty 1

## 2016-11-17 MED ORDER — ACETAMINOPHEN 325 MG PO TABS
650.0000 mg | ORAL_TABLET | ORAL | Status: DC | PRN
  Administered 2016-11-18: 650 mg via ORAL
  Filled 2016-11-17: qty 2

## 2016-11-17 NOTE — ED Triage Notes (Signed)
[facility] this afternoon. Pt has had 2 nitros with EMS and 324mg  ASA.

## 2016-11-17 NOTE — ED Provider Notes (Signed)
[facility]-EMERGENCY DEPT Provider Note   CSN: [id number] Arrival date & time: 11/17/16  1642     History   Chief Complaint Chief Complaint  Patient presents with  . Chest Pain    HPI Gary Buck is a 51 y.o. male.  HPI  Patient with a past medical history of hypertension, hyperlipidemia, CAD presents with 2 hour history of chest pain. He states that all of a sudden he felt diaphoretic, nauseous and began having right-sided chest pain. He denies prior history of this type of chest pain in the past. Denies any prior MI. He states that diaphoresis has resolved but he is still experiencing intermittent right-sided chest pain and nausea along with abdominal discomfort. Also reports some shortness of breath. Has been out of his blood pressure medication for the past 2-3 days. Patient reports tobacco and marijuana use. Denies any new medication changes. Reports last cocaine use ~8 months ago. Patient denies hemoptysis, leg swelling, cocaine use, fever, recent surgery, recent illness, syncope, head injury, weakness or numbness.  Past Medical History:  Diagnosis Date  . Anginal pain (HCC)   . Bipolar disorder (HCC)   . Depression   . ETOH abuse   . H/O cocaine abuse   . Hypercholesterolemia   . Hypertension   . PAF (paroxysmal atrial fibrillation) (HCC)    [name]/notes 12/21/2014  . Stroke [facility](HCC) 2004   pt sts about 12 years ago.   . Tobacco abuse   . Type II diabetes mellitus (HCC) dx'd 12/21/2014    Patient Active Problem List   Diagnosis Date Noted  . Bipolar affective disorder, mixed, severe (HCC) 06/28/2016  . Bipolar affective disorder, current episode mixed, without psychotic features (HCC) 06/28/2016  . Atrial fibrillation, unspecified   . Elevated troponin   . Atrial fibrillation with RVR (HCC) 12/21/2014  . CAD (coronary artery disease) 12/21/2014  . Chest pain   . Essential hypertension   . Remote history of stroke   . Polysubstance abuse     Past Surgical History:    Procedure Laterality Date  . CYSTECTOMY  ~ 2004   "on the top of my forehead"  . INGUINAL HERNIA REPAIR Left ~ 2012       Home Medications    Prior to Admission medications   Medication Sig Start Date End Date Taking? Authorizing Provider  apixaban (ELIQUIS) 5 MG TABS tablet Take 1 tablet (5 mg total) by mouth 2 (two) times daily. 07/20/16  Yes Plunkett, Alphonzo LemmingsWhitney, MD  lisinopril (PRINIVIL,ZESTRIL) 40 MG tablet Take 1 tablet (40 mg total) by mouth daily. 07/20/16  Yes Gwyneth SproutPlunkett, Whitney, MD  metoprolol tartrate (LOPRESSOR) 25 MG tablet Take 1 tablet (25 mg total) by mouth 2 (two) times daily. 07/20/16  Yes Gwyneth SproutPlunkett, Whitney, MD  atorvastatin (LIPITOR) 10 MG tablet Take 1 tablet (10 mg total) by mouth daily at 6 PM. Patient not taking: Reported on 11/17/2016 07/20/16   Gwyneth SproutPlunkett, Whitney, MD  diltiazem (CARDIZEM CD) 180 MG 24 hr capsule Take 1 capsule (180 mg total) by mouth daily. Patient not taking: Reported on 11/17/2016 07/20/16   Gwyneth SproutPlunkett, Whitney, MD  nicotine (NICODERM CQ - DOSED IN MG/24 HOURS) 21 mg/24hr patch Place 1 patch (21 mg total) onto the skin daily. Patient not taking: Reported on 11/17/2016 07/04/16   Adonis BrookAgustin, Sheila, NP  Oxcarbazepine (TRILEPTAL) 300 MG tablet Take 1 tablet (300 mg total) by mouth 2 (two) times daily. Patient not taking: Reported on 11/17/2016 07/20/16   Gwyneth SproutPlunkett, Whitney, MD  risperiDONE (RISPERDAL) 1 MG  tablet Take 1 tablet (1 mg total) by mouth 2 (two) times daily. Patient not taking: Reported on 11/17/2016 07/20/16   Gwyneth Sprout, MD  thiamine 100 MG tablet Take 1 tablet (100 mg total) by mouth daily. Patient not taking: Reported on 11/17/2016 07/04/16   Adonis Brook, NP    Family History Family History  Problem Relation Age of Onset  . Diabetes Mellitus II Mother     Social History Social History  Substance Use Topics  . Smoking status: Current Every Day Smoker    Packs/day: 0.50    Years: 32.00    Types: Cigarettes  . Smokeless tobacco: Never Used  .  Alcohol use 32.4 oz/week    54 Cans of beer per week     Comment: 12/21/2014 "2-3, 24oz  beers per night during work nights, 6 24oz beer per day on the weekends"     Allergies   Patient has no known allergies.   Review of Systems Review of Systems  Constitutional: Negative for appetite change, chills and fever.  HENT: Negative for ear pain, rhinorrhea, sneezing and sore throat.   Eyes: Negative for photophobia and visual disturbance.  Respiratory: Positive for cough and shortness of breath. Negative for chest tightness and wheezing.   Cardiovascular: Positive for chest pain. Negative for palpitations.  Gastrointestinal: Positive for abdominal pain and nausea. Negative for blood in stool, constipation, diarrhea and vomiting.  Genitourinary: Negative for dysuria, hematuria and urgency.  Musculoskeletal: Negative for myalgias.  Skin: Negative for rash.  Neurological: Negative for dizziness, weakness and light-headedness.     Physical Exam Updated Vital Signs BP (!) 160/88   Pulse 72   Temp 97.7 F (36.5 C) (Oral)   Resp (!) 21   Ht 6' 5.5" (1.969 m)   Wt (!) 145.2 kg (320 lb)   SpO2 98%   BMI 37.46 kg/m   Physical Exam  Constitutional: He is oriented to person, place, and time. He appears well-developed and well-nourished. No distress.  HENT:  Head: Normocephalic and atraumatic.  Nose: Nose normal.  Eyes: Conjunctivae and EOM are normal. Pupils are equal, round, and reactive to light. Right eye exhibits no discharge. Left eye exhibits no discharge. No scleral icterus.  Neck: Normal range of motion. Neck supple.  Cardiovascular: Normal rate, regular rhythm, normal heart sounds and intact distal pulses.  Exam reveals no gallop and no friction rub.   No murmur heard. Pulmonary/Chest: Effort normal and breath sounds normal. No respiratory distress.  Abdominal: Soft. Bowel sounds are normal. He exhibits no distension. There is no tenderness. There is no guarding.    Musculoskeletal: Normal range of motion. He exhibits no edema.  Neurological: He is alert and oriented to person, place, and time. He exhibits normal muscle tone. Coordination normal.  Skin: Skin is warm and dry. No rash noted.  Psychiatric: He has a normal mood and affect.  Nursing note and vitals reviewed.    ED Treatments / Results  Labs (all labs ordered are listed, but only abnormal results are displayed) Labs Reviewed  BASIC METABOLIC PANEL - Abnormal; Notable for the following:       Result Value   Potassium 3.1 (*)    Glucose, Bld 124 (*)    All other components within normal limits  CBC - Abnormal; Notable for the following:    WBC 13.1 (*)    All other components within normal limits  MAGNESIUM  PHOSPHORUS  I-STAT TROPOININ, ED    EKG  EKG Interpretation  Date/Time:  Saturday November 17 2016 16:54:13 EDT Ventricular Rate:  70 PR Interval:    QRS Duration: 101 QT Interval:  442 QTC Calculation: 477 R Axis:   -19 Text Interpretation:  Sinus rhythm Probable left atrial enlargement Left ventricular hypertrophy Anterior Q waves, possibly due to LVH - not new prominent ST elevation in v1-v3 is  not new ST depression in the inferior leads is not new Confirmed by Derwood Kaplan [phone]) on 11/17/2016 7:31:11 PM       Radiology Dg Chest 2 View  Result Date: 11/17/2016 CLINICAL DATA:  Central chest pain EXAM: CHEST  2 VIEW COMPARISON:  09/18/2016 FINDINGS: Heart is mildly enlarged. Lungs are clear. No effusions. No acute bony abnormality. IMPRESSION: Mild cardiomegaly.  No active disease. Electronically Signed   By: Charlett Nose M.D.   On: 11/17/2016 17:23    Procedures Procedures (including critical care time)  Medications Ordered in ED Medications  morphine 4 MG/ML injection 4 mg (4 mg Intravenous Given 11/17/16 1746)  potassium chloride SA (K-DUR,KLOR-CON) CR tablet 40 mEq (40 mEq Oral Given 11/17/16 1800)  morphine 4 MG/ML injection 4 mg (4 mg Intravenous Given 11/17/16  2037)  nitroGLYCERIN (NITROGLYN) 2 % ointment 0.5 inch (0.5 inches Topical Given 11/17/16 2037)     Initial Impression / Assessment and Plan / ED Course  I have reviewed the triage vital signs and the nursing notes.  Pertinent labs & imaging results that were available during my care of the patient were reviewed by me and considered in my medical decision making (see chart for details).     Patient's history and symptoms concerning for ACS versus pneumonia versus PE. Patient reports chest pain that began approximately 2 hours PTA. He denies any previous history of similar complaints. EKG revealed no changes from baseline and showed ST elevations in leads V1 through V3 which have been present in the past. Troponin was negative 1. BMP revealed potassium at 3.1 which was repleted orally here in the ED. CBC showed mild leukocytosis at 13.1. Chest x-ray revealed no acute abnormalities. No concern for PE at this time, as patient denies hemoptysis, leg swelling, prior history of DVT or PE, recent surgery and is satting at 97% on room Patient given morphine here with improvement in symptoms.  Due to patient's HEART score of 5 and increased risk of adverse cardiac event, will admit patient for further evaluation and observation. Spoke to Dr. Robb Matar who will admit patient.  Patient discussed with and seen by Dr. Rhunette Croft.  Final Clinical Impressions(s) / ED Diagnoses   Final diagnoses:  Unstable angina [facility])    New Prescriptions New Prescriptions   No medications on file     Dietrich Pates, PA-C 11/18/16 1537    Derwood Kaplan, MD 11/18/16 [phone]

## 2016-11-17 NOTE — H&P (Signed)
History and Physical    Gary Buck ZOX:[id number] DOB: 1966/01/24 DOA: 11/17/2016  PCP: Lavinia Sharps, NP   Patient coming from: Home.  I have personally briefly reviewed patient's old medical records in [facility]  Chief Complaint: Chest pain.  HPI: Gary Buck is a 51 y.o. male with medical history significant of angina, bipolar disorder, depression, EtOH abuse, cocaine abuse, hyperlipidemia, hypertension, paroxysmal atrial fibrillation, CVA, tobacco abuse, type 2 diabetes who is coming to the emergency department with complaints of right-sided chest pain, pressure-like, nonradiating, associated with mild dyspnea, diaphoresis and nausea. He denies exacerbating factors. He states that the pain Partially relieved by sublingual nitroglycerin and aspirin, but only felt significant relief with morphine given in the emergency department. He denies headache, sore throat, productive cough, abdominal pain, diarrhea, constipation, melena or hematochezia. He denies urinary symptoms. He denies recent recreational drug use, except for smoked Cannabis. In July 2016 he had a low risk nuclear stress test with normal perfusion, but mild to moderate reduction in global systolic function.  ED Course: The patient had Nitropaste and morphine in the ED experiencing relief. Troponin level was negative, EKG was sinus rhythm probably LAE, positive LVH ST elevation in V1-V3 and ST depression on inferior leads which is similar to previous EKG done in April this year. WBC 13.1, hemoglobin 15.8 g/dL and platelets 811. Sodium 138, potassium 3.1, chloride 103 and bicarbonate 23 mmol/L. BUN was 12, creatinine 0.96 and glucose 124 mg/dL. Chest radiograph showed cardiomegaly.  Review of Systems: As per HPI otherwise 10 point review of systems negative.    Past Medical History:  Diagnosis Date  . Anginal pain (HCC)   . Bipolar disorder (HCC)   . Depression   . ETOH abuse   . H/O cocaine abuse   .  Hypercholesterolemia   . Hypertension   . PAF (paroxysmal atrial fibrillation) (HCC)    Gary Buck 12/21/2014  . Stroke [facility] 2004   pt sts about 12 years ago.   . Tobacco abuse   . Type II diabetes mellitus (HCC) dx'd 12/21/2014    Past Surgical History:  Procedure Laterality Date  . CYSTECTOMY  ~ 2004   "on the top of my forehead"  . INGUINAL HERNIA REPAIR Left ~ 2012     reports that he has been smoking Cigarettes.  He has a 16.00 pack-year smoking history. He has never used smokeless tobacco. He reports that he drinks about 32.4 oz of alcohol per week . He reports that he uses drugs, including Marijuana.  No Known Allergies  Family History  Problem Relation Age of Onset  . Diabetes Mellitus II Mother     Prior to Admission medications   Medication Sig Start Date End Date Taking? Authorizing Provider  apixaban (ELIQUIS) 5 MG TABS tablet Take 1 tablet (5 mg total) by mouth 2 (two) times daily. 07/20/16  Yes Plunkett, Alphonzo Lemmings, MD  lisinopril (PRINIVIL,ZESTRIL) 40 MG tablet Take 1 tablet (40 mg total) by mouth daily. 07/20/16  Yes Gwyneth Sprout, MD  metoprolol tartrate (LOPRESSOR) 25 MG tablet Take 1 tablet (25 mg total) by mouth 2 (two) times daily. 07/20/16  Yes Gwyneth Sprout, MD  atorvastatin (LIPITOR) 10 MG tablet Take 1 tablet (10 mg total) by mouth daily at 6 PM. Patient not taking: Reported on 11/17/2016 07/20/16   Gwyneth Sprout, MD  diltiazem (CARDIZEM CD) 180 MG 24 hr capsule Take 1 capsule (180 mg total) by mouth daily. Patient not taking: Reported on 11/17/2016 07/20/16   Anitra Lauth,  Whitney, MD  nicotine (NICODERM CQ - DOSED IN MG/24 HOURS) 21 mg/24hr patch Place 1 patch (21 mg total) onto the skin daily. Patient not taking: Reported on 11/17/2016 07/04/16   Adonis BrookAgustin, Sheila, NP  Oxcarbazepine (TRILEPTAL) 300 MG tablet Take 1 tablet (300 mg total) by mouth 2 (two) times daily. Patient not taking: Reported on 11/17/2016 07/20/16   Gwyneth SproutPlunkett, Whitney, MD  risperiDONE (RISPERDAL) 1 MG  tablet Take 1 tablet (1 mg total) by mouth 2 (two) times daily. Patient not taking: Reported on 11/17/2016 07/20/16   Gwyneth SproutPlunkett, Whitney, MD  thiamine 100 MG tablet Take 1 tablet (100 mg total) by mouth daily. Patient not taking: Reported on 11/17/2016 07/04/16   Adonis BrookAgustin, Sheila, NP    Physical Exam: Vitals:   11/17/16 2000 11/17/16 2100 11/17/16 2130 11/17/16 2301  BP: (!) 162/95 (!) 160/88 (!) 156/86 (!) 169/95  Pulse: 73 72 70 66  Resp:  (!) 21 20 20   Temp:    97.8 F (36.6 C)  TempSrc:    Oral  SpO2: 99% 98% 98% 93%  Weight:    (!) 145.3 kg (320 lb 4.8 oz)  Height:    6' 5.2" (1.961 m)    Constitutional: NAD, calm, comfortable Eyes: PERRL, lids and conjunctivae normal ENMT: Mucous membranes are moist. Posterior pharynx clear of any exudate or lesions. Neck: normal, supple, no masses, no thyromegaly Respiratory: clear to auscultation bilaterally, no wheezing, no crackles. Normal respiratory effort. No accessory muscle use.  Cardiovascular: Regular rate and rhythm, no murmurs / rubs / gallops. No extremity edema. 2+ pedal pulses. No carotid bruits.  Abdomen: Obese, soft, no tenderness, no masses palpated. No hepatosplenomegaly. Bowel sounds positive.  Musculoskeletal: no clubbing / cyanosis. Good ROM, no contractures. Normal muscle tone.  Skin: no rashes, lesions, ulcers on limited skin exam. Neurologic: CN 2-12 grossly intact. Sensation intact, DTR normal. Strength 5/5 in all 4.  Psychiatric: Normal judgment and insight. Alert and oriented x 4. Normal mood.    Labs on Admission: I have personally reviewed following labs and imaging studies  CBC:  Recent Labs Lab 11/17/16 1706  WBC 13.1*  HGB 15.8  HCT 46.8  MCV 91.8  PLT 227   Basic Metabolic Panel:  Recent Labs Lab 11/17/16 1706  NA 138  K 3.1*  CL 103  CO2 23  GLUCOSE 124*  BUN 12  CREATININE 0.96  CALCIUM 8.9  MG 2.0  PHOS 4.9*   GFR: Estimated Creatinine Clearance: 145.7 mL/min (by C-G formula based on  SCr of 0.96 mg/dL). Liver Function Tests: No results for input(s): AST, ALT, ALKPHOS, BILITOT, PROT, ALBUMIN in the last 168 hours. No results for input(s): LIPASE, AMYLASE in the last 168 hours. No results for input(s): AMMONIA in the last 168 hours. Coagulation Profile: No results for input(s): INR, PROTIME in the last 168 hours. Cardiac Enzymes: No results for input(s): CKTOTAL, CKMB, CKMBINDEX, TROPONINI in the last 168 hours. BNP (last 3 results) No results for input(s): PROBNP in the last 8760 hours. HbA1C: No results for input(s): HGBA1C in the last 72 hours. CBG:  Recent Labs Lab 11/17/16 2313  GLUCAP 122*   Lipid Profile: No results for input(s): CHOL, HDL, LDLCALC, TRIG, CHOLHDL, LDLDIRECT in the last 72 hours. Thyroid Function Tests: No results for input(s): TSH, T4TOTAL, FREET4, T3FREE, THYROIDAB in the last 72 hours. Anemia Panel: No results for input(s): VITAMINB12, FOLATE, FERRITIN, TIBC, IRON, RETICCTPCT in the last 72 hours. Urine analysis:    Component Value Date/Time  COLORURINE STRAW (A) 10/15/2016 1522   APPEARANCEUR CLEAR 10/15/2016 1522   LABSPEC 1.003 (L) 10/15/2016 1522   PHURINE 5.0 10/15/2016 1522   GLUCOSEU NEGATIVE 10/15/2016 1522   HGBUR NEGATIVE 10/15/2016 1522   BILIRUBINUR NEGATIVE 10/15/2016 1522   KETONESUR NEGATIVE 10/15/2016 1522   PROTEINUR NEGATIVE 10/15/2016 1522   UROBILINOGEN 0.2 12/21/2014 1538   NITRITE NEGATIVE 10/15/2016 1522   LEUKOCYTESUR NEGATIVE 10/15/2016 1522    Radiological Exams on Admission: Dg Chest 2 View  Result Date: 11/17/2016 CLINICAL DATA:  Central chest pain EXAM: CHEST  2 VIEW COMPARISON:  09/18/2016 FINDINGS: Heart is mildly enlarged. Lungs are clear. No effusions. No acute bony abnormality. IMPRESSION: Mild cardiomegaly.  No active disease. Electronically Signed   By: Charlett Nose M.D.   On: 11/17/2016 17:23    EKG: Independently reviewed.  Vent. rate 70 BPM PR interval * ms QRS duration 101  ms QT/QTc 442/477 ms P-R-T axes 35 -19 7 Sinus rhythm Probable left atrial enlargement Left ventricular hypertrophy Anterior Q waves, possibly due to LVH - not new prominent ST elevation in v1-v3 is not new ST depression in the inferior leads is not new  Assessment/Plan Principal Problem:   Chest pain SDU/observation. Trend troponin levels. Continue Nitropaste. Continue morphine as needed. Check echocardiogram. Cardiology was consulted by the ED.  Active Problems:   ETOH abuse Continue CiWA protocol. Folic acid, magnesium, multivitamin and thiamine supplementation.    Essential hypertension Continue lisinopril and metoprolol. Monitor blood pressure, renal function and electrolytes.    PAF (paroxysmal atrial fibrillation) (HCC) CHA2DS2-VASc Score of at least 3. Continue anticoagulation and beta blocker for rate control.    Hypercholesterolemia Currently not on medication. Given history of alcohol abuse and medication noncompliance, this is not advisable.    Type II diabetes mellitus (HCC) Carbohydrate modified diet. CBG monitoring Regular Insulin sliding scale.    Bipolar disorder (HCC) Currently not taking medications.      Tobacco abuse Nicotine replacement therapy ordered Smoking cessation info to be provided.   DVT prophylaxis: Lovenox SQ. Code Status: Full code. Family Communication:  Disposition Plan: Admit for chest pain work up. Consults called: Cardiology was consulted by ED. Admission status: Observation/telemetry.   Bobette Mo MD [facility] Pager [phone].  If 7PM-7AM, please contact night-coverage [email or web address] Password [email or web address]  11/17/2016, 11:20 PM

## 2016-11-18 ENCOUNTER — Other Ambulatory Visit (HOSPITAL_COMMUNITY): Payer: Self-pay

## 2016-11-18 DIAGNOSIS — Z720 Tobacco use: Secondary | ICD-10-CM

## 2016-11-18 DIAGNOSIS — I10 Essential (primary) hypertension: Secondary | ICD-10-CM | POA: Diagnosis present

## 2016-11-18 DIAGNOSIS — E7800 Pure hypercholesterolemia, unspecified: Secondary | ICD-10-CM

## 2016-11-18 DIAGNOSIS — F1010 Alcohol abuse, uncomplicated: Secondary | ICD-10-CM

## 2016-11-18 DIAGNOSIS — I480 Paroxysmal atrial fibrillation: Secondary | ICD-10-CM

## 2016-11-18 DIAGNOSIS — E669 Obesity, unspecified: Secondary | ICD-10-CM | POA: Diagnosis present

## 2016-11-18 DIAGNOSIS — R0782 Intercostal pain: Secondary | ICD-10-CM

## 2016-11-18 LAB — TROPONIN I: Troponin I: <0.03 ng/mL (ref <0.03)

## 2016-11-18 LAB — HIV ANTIBODY (ROUTINE TESTING W REFLEX): HIV SCREEN 4TH GENERATION: NONREACTIVE

## 2016-11-18 LAB — GLUCOSE, CAPILLARY: Glucose-Capillary: 154 mg/dL — ABNORMAL HIGH (ref 65–99)

## 2016-11-18 LAB — ETHANOL

## 2016-11-18 MED ORDER — ADULT MULTIVITAMIN W/MINERALS CH
1.0000 | ORAL_TABLET | Freq: Every day | ORAL | Status: DC
  Administered 2016-11-18: 1 via ORAL
  Filled 2016-11-18: qty 1

## 2016-11-18 MED ORDER — LORAZEPAM 2 MG/ML IJ SOLN: 1.0000 mg | Freq: Four times a day (QID) | INTRAMUSCULAR | Status: DC | PRN

## 2016-11-18 MED ORDER — LORAZEPAM 1 MG PO TABS: 0.0000 mg | ORAL_TABLET | Freq: Four times a day (QID) | ORAL | Status: DC

## 2016-11-18 MED ORDER — FOLIC ACID 1 MG PO TABS
1.0000 mg | ORAL_TABLET | Freq: Every day | ORAL | Status: DC
  Administered 2016-11-18: 1 mg via ORAL
  Filled 2016-11-18: qty 1

## 2016-11-18 MED ORDER — NICOTINE 14 MG/24HR TD PT24
14.0000 mg | MEDICATED_PATCH | Freq: Every day | TRANSDERMAL | Status: DC
  Administered 2016-11-18: 14 mg via TRANSDERMAL
  Filled 2016-11-18 (×2): qty 1

## 2016-11-18 MED ORDER — APIXABAN 5 MG PO TABS
5.0000 mg | ORAL_TABLET | Freq: Two times a day (BID) | ORAL | Status: DC
Start: 2016-11-18 — End: 2016-11-18
  Administered 2016-11-18: 5 mg via ORAL
  Filled 2016-11-18: qty 1

## 2016-11-18 MED ORDER — LORAZEPAM 1 MG PO TABS: 1.0000 mg | ORAL_TABLET | Freq: Four times a day (QID) | ORAL | Status: DC | PRN

## 2016-11-18 MED ORDER — LORAZEPAM 1 MG PO TABS: 0.0000 mg | ORAL_TABLET | Freq: Two times a day (BID) | ORAL | Status: DC

## 2016-11-18 MED ORDER — VITAMIN B-1 100 MG PO TABS
100.0000 mg | ORAL_TABLET | Freq: Every day | ORAL | Status: DC
  Administered 2016-11-18: 100 mg via ORAL
  Filled 2016-11-18: qty 1

## 2016-11-18 MED ORDER — THIAMINE HCL 100 MG/ML IJ SOLN: 100.0000 mg | Freq: Every day | INTRAMUSCULAR | Status: DC

## 2016-11-18 MED ORDER — METOPROLOL TARTRATE 25 MG PO TABS
25.0000 mg | ORAL_TABLET | Freq: Two times a day (BID) | ORAL | Status: DC
  Administered 2016-11-18: 25 mg via ORAL
  Filled 2016-11-18: qty 1

## 2016-11-18 MED ORDER — METOPROLOL TARTRATE 25 MG PO TABS: 25.0000 mg | ORAL_TABLET | Freq: Two times a day (BID) | ORAL | 0 refills | Status: DC

## 2016-11-18 MED ORDER — LISINOPRIL 40 MG PO TABS: 40.0000 mg | ORAL_TABLET | Freq: Every day | ORAL | 0 refills | Status: DC

## 2016-11-18 MED ORDER — LISINOPRIL 40 MG PO TABS
40.0000 mg | ORAL_TABLET | Freq: Every day | ORAL | Status: DC
  Administered 2016-11-18: 40 mg via ORAL
  Filled 2016-11-18: qty 1

## 2016-11-18 MED ORDER — NICOTINE 21 MG/24HR TD PT24: 21.0000 mg | MEDICATED_PATCH | Freq: Every day | TRANSDERMAL | 0 refills | Status: DC

## 2016-11-18 NOTE — Consult Note (Signed)
Cardiology Consultation:   Patient ID: Gary MorosDexter Brouillard; [id number]; 1966-03-12   Admit date: 11/17/2016 Date of Consult: 11/18/2016  Primary Care Provider: Lavinia SharpsPlacey, [name], NP Primary Cardiologist: Dr. Rennis GoldenHilty Primary Electrophysiologist:  None   Patient Profile:   Gary Buck is a 51 y.o. male with a hx of angina, PAF on Eliquis, CAD (but cannot not find documentation to support this, I did see that his has calcification to his abdominal aorta), stroke (per patient 12 years ago),  bipolar disorder, depression, ETOH abuse, h/o cocaine abuse, hypercholesterolemia, hypertension, stroke, tobacco abuse, type II diabetes who is being seen today for the evaluation of EKG changes at the request of Dr. Gonzella Lexhungel.  History of Present Illness:   Gary Buck presented to the ER for  two hour episode of chest pain. He acutely felt diaphoretic, nauseous and developed right sided chest pain. He admitted that he had been out of his BP medications the past few days and has not been taking them. Last cocaine use ~ 8 months ago, + TCH and tobacco use. Work-up in the ER revealed two negative troponins and an unchanged EKG.  Chest xray showed mild cardiomegaly.  He was admitted to Medicine per heart score of 5 by [facility]. Troponin neg x 3. Has an echo pending. He denies recurrent CP through out the night. He was seen last month as well for chest pain and says this felt the same. Currently pain free.  Past Medical History:  Diagnosis Date  . Anginal pain (HCC)   . Bipolar disorder (HCC)   . Depression   . ETOH abuse   . H/O cocaine abuse   . Hypercholesterolemia   . Hypertension   . PAF (paroxysmal atrial fibrillation) (HCC)    [name]/notes 12/21/2014  . Stroke [facility](HCC) 2004   pt sts about 12 years ago.   . Tobacco abuse   . Type II diabetes mellitus (HCC) dx'd 12/21/2014    Past Surgical History:  Procedure Laterality Date  . CYSTECTOMY  ~ 2004   "on the top of my forehead"  . INGUINAL HERNIA REPAIR Left  ~ 2012     Inpatient Medications: Scheduled Meds: . folic acid  1 mg Oral Daily  . insulin aspart  0-20 Units Subcutaneous TID WC  . LORazepam  0-4 mg Oral Q6H   Followed by  . [START ON 11/20/2016] LORazepam  0-4 mg Oral Q12H  . multivitamin with minerals  1 tablet Oral Daily  . nicotine  14 mg Transdermal Daily  . nitroGLYCERIN  0.5 inch Topical Q6H  . thiamine  100 mg Oral Daily   Or  . thiamine  100 mg Intravenous Daily   Continuous Infusions:  PRN Meds: acetaminophen, LORazepam **OR** LORazepam, morphine injection, ondansetron (ZOFRAN) IV  Allergies:   No Known Allergies  Social History:   Social History   Social History  . Marital status: Single    Spouse name: N/A  . Number of children: N/A  . Years of education: N/A   Occupational History  . Not on file.   Social History Main Topics  . Smoking status: Current Every Day Smoker    Packs/day: 0.50    Years: 32.00    Types: Cigarettes  . Smokeless tobacco: Never Used  . Alcohol use 32.4 oz/week    54 Cans of beer per week     Comment: 12/21/2014 "2-3, 24oz  beers per night during work nights, 6 24oz beer per day on the weekends"  . Drug use:  Yes    Types: Marijuana     Comment: 12/21/2014 "last cocaine was ~ 2 months ago"  . Sexual activity: Yes   Other Topics Concern  . Not on file   Social History Narrative  . No narrative on file    Family History:   The patient's family history includes Diabetes Mellitus II in his mother.  ROS:  Please see the history of present illness.  All other ROS reviewed and negative.     Physical Exam/Data:   Vitals:   11/17/16 2100 11/17/16 2130 11/17/16 2301 11/18/16 0111  BP: (!) 160/88 (!) 156/86 (!) 169/95   Pulse: 72 70 66 65  Resp: (!) 21 20 20 14   Temp:   97.8 F (36.6 C)   TempSrc:   Oral   SpO2: 98% 98% 93%   Weight:   (!) 320 lb 4.8 oz (145.3 kg)   Height:   6' 5.2" (1.961 m)    No intake or output data in the 24 hours ending 11/18/16 0808 Filed  Weights   11/17/16 1652 11/17/16 2301  Weight: (!) 320 lb (145.2 kg) (!) 320 lb 4.8 oz (145.3 kg)   Body mass index is 37.79 kg/m.  General: Well developed, well nourished, in no acute distress. Head: Normocephalic, atraumatic, sclera non-icteric, no xanthomas, nares are without discharge.  Neck: Negative for carotid bruits. JVD not elevated. Lungs: Clear bilaterally to auscultation without wheezes, rales, or rhonchi. Breathing is unlabored. Heart: RRR with S1 S2. No murmurs, rubs, or gallops appreciated. Abdomen: Soft, non-tender, non-distended with normoactive bowel sounds. No hepatomegaly. No rebound/guarding. No obvious abdominal masses. Msk:  Strength and tone appear normal for age. Extremities: No clubbing or cyanosis. No edema.  Distal pedal pulses are 2+ and equal bilaterally. Neuro: Alert and oriented X 3. No facial asymmetry. No focal deficit. Moves all extremities spontaneously. Psych:  Responds to questions appropriately with a normal affect.  EKG:  The EKG was personally reviewed and demonstrates sinus brady, poss left atrial enlargment, LVH, HR 59. No afib  Relevant CV Studies:  11/18/2016 ECHO pending for this admission   11/18/2016 Transthoracic Echocardiography ------------------------------------------------------------------- Study Conclusions  - Left ventricle: The cavity size was normal. There was moderate   concentric hypertrophy. Systolic function was normal. The   estimated ejection fraction was in the range of 60% to 65%.   Although no diagnostic regional wall motion abnormality was   identified, this possibility cannot be completely excluded on the   basis of this study. - Left atrium: The atrium was moderately dilated.  Impressions:  - Compared to the prior study, there has been no significant   interval change.   Nuclear Myoview 12/2014  Study Result    There was no ST segment deviation noted during stress.  No T wave inversion was noted  during stress.  The study is normal.  This is a low risk study.  The left ventricular ejection fraction is moderately decreased (30-44%).   Low risk stress nuclear study with normal perfusion and mild to moderate reduction in left ventricular global systolic function. 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 "Balanced ischemia" cannot be entirely excluded.       Laboratory Data:  Chemistry Recent Labs Lab 11/17/16 1706  NA 138  K 3.1*  CL 103  CO2 23  GLUCOSE 124*  BUN 12  CREATININE 0.96  CALCIUM 8.9  GFRNONAA >60  GFRAA >60  ANIONGAP 12    No results for input(s): PROT, ALBUMIN, AST,  ALT, ALKPHOS, BILITOT in the last 168 hours. Hematology Recent Labs Lab 11/17/16 1706  WBC 13.1*  RBC 5.10  HGB 15.8  HCT 46.8  MCV 91.8  MCH 31.0  MCHC 33.8  RDW 13.5  PLT 227   Cardiac Enzymes Recent Labs Lab 11/17/16 2319 11/18/16 0538  TROPONINI <0.03 <0.03    Recent Labs Lab 11/17/16 1710  TROPIPOC 0.00    Radiology/Studies:  Dg Chest 2 View  Result Date: 11/17/2016 CLINICAL DATA:  Central chest pain EXAM: CHEST  2 VIEW COMPARISON:  09/18/2016 FINDINGS: Heart is mildly enlarged. Lungs are clear. No effusions. No acute bony abnormality. IMPRESSION: Mild cardiomegaly.  No active disease. Electronically Signed   By: Charlett Nose M.D.   On: 11/17/2016 17:23    Assessment and Plan:   1. Chest pain: Very low suspicion for ACS, I  believe patient is safe for outpatient follow up with cardiology, unless his presentation changes. Plan is for +/- echo inpatient.  Low suspicion for PE.  He has already eaten this morning and appears comfortable.  2. PAF- anticoagulated with normal rate and rhythm. -- cont Eliquis  3. Hypertension-  Poorly controlled BP (ranges 153-80-- 169-95) -- on Lisinopril 40 mg and Metoprolol 25 mg BID -- Should we add on Norvasc 5 mg, will discuss with Dr. Purvis Sheffield. He has been off of his BP meds for a few days.   We  will follow-up on echo results for further recommendations on outpatient vs inpatient eval.    Signed, Dorthula Matas, PA-C  11/18/2016 8:08 AM   The patient was seen and examined, and I agree with the history, physical exam, assessment and plan as documented above, with modifications as noted below. I have also personally reviewed all relevant documentation, old records, labs, and both radiographic and cardiovascular studies. I have also independently interpreted old and new ECG's.  51 yr old male with hypertension and PAF admitted with chest pain, diaphoresis, and lightheadedness. He had been picking up extra parts at a junkyard and then went with a friend to [employer] to eat. Ate 4 plates and sweet tea and then went to [employer].  There he felt acutely lightheaded, diaphoretic ("It felt like someone was pouring buckets of water on my head"), said he was "seeing spots", and developed retrosternal chest pain.  ECG which I personally interpreted showed NSR with nonspecific ST-T abnormalities. Troponins have been normal.  He ran out of BP meds and he was markedly hypertensive at time of ED presentation.  Echo ordered and is pending.  Review of July 2016 CV studies shows normal nuclear stress test and echo at that time showed normal LV systolic function.  At this point, I think we can refill his antihypertensive meds and arrange for outpatient cardiology follow up. He can obtain the echocardiogram as an outpatient, and perhaps stress testing.  At that time, antihypertensive therapy can be titrated as needed.  No further recommendations. Will sign off.   Prentice Docker, MD, [facility]  11/18/2016 8:57 AM

## 2016-11-18 NOTE — Discharge Summary (Signed)
Physician Discharge Summary  Gary Buck JXB:[id number] DOB: 1965/06/27 DOA: 11/17/2016  PCP: Gary Buck, Gary Ann, NP  Admit date: 11/17/2016 Discharge date: 11/18/2016  Admitted From: Home Disposition:  Home  Recommendations for Outpatient Follow-up:  1. Follow up with PCP in 1-2 weeks 2. Cardiology will arrange outpatient follow-up for 2-D echo and possible stress test.  Home Health: None Equipment/Devices: None  Discharge Condition: Fair CODE STATUS: Full code Diet recommendation: Heart Healthy / low sodium    Discharge Diagnoses:  Principal Problem:   Chest pain, atypical  Active Problems:   Essential hypertension   PAF (paroxysmal atrial fibrillation) (HCC)   Hypercholesterolemia   Type II diabetes mellitus (HCC)   Bipolar disorder (HCC)   ETOH abuse   Tobacco abuse   Brief narrative/history of present illness 51 year old male with history of bipolar disorder, depression, alcohol abuse, ongoing tobacco use, cocaine use (reports quitting 8 months back), hyperlipidemia, paroxysmal A. fib on anticoagulant and, history of TIA, type 2 diabetes mellitus and hypertension presented to the ED with left-sided chest pain, pressure-like, nonradiating associated with mild dyspnea, diaphoresis and nausea. He denies any aggravating symptoms. Patient had some relief with sublingual nitroglycerin and aspirin in the ED. Symptoms subsided after receiving IV morphine in the ED. He denied any palpitations, orthopnea, PND, fever, chills, headache, nausea, vomiting, abdominal pain, bowel or urinary symptoms. Patient had a normal 2-D echo in July 2016 and had a low risk nuclear stress test with mild to moderate reduction in global systolic function. Patient reports that he ran out of his blood pressure medications recently.  In the ED EKG showed no new changes. Patient observed on telemetry to rule out ACS.  Hospital course  chest pain Right-sided chest pain symptoms which appears atypical and  likely musculoskeletal. EKG unremarkable for new changes. Serial troponin have been negative. No suspicion for pulmonary margin. Chest pain symptoms have resolved. -Discussed with cardiology. Given atypical symptoms which have resolved and reassuring 2-D echo and stress test about 2 years back, they agree with outpatient follow-up with 2-D echo and possible stress test. (Cardiology will arrange).  Paroxysmal A. fib Rate controlled. Continue beta blocker and eliquis.  Uncontrolled hypertension Patient reported he was off his blood pressure medication last few days.  I have given him a written prescription for both his lisinopril and metoprolol. Instructed to be compliant with his medications and salt restriction in diet..   Obesity Counseled on diet and excised.  Ongoing tobacco and cannabis use Counseled strongly on cessation. Prescribed nicotine patch.  Family communication: None at bedside  Procedures: None  Disposition: Home  Discharge Instructions   Allergies as of 11/18/2016   No Known Allergies     Medication List    STOP taking these medications   atorvastatin 10 MG tablet Commonly known as:  LIPITOR   diltiazem 180 MG 24 hr capsule Commonly known as:  CARDIZEM CD   Oxcarbazepine 300 MG tablet Commonly known as:  TRILEPTAL   risperiDONE 1 MG tablet Commonly known as:  RISPERDAL   thiamine 100 MG tablet     TAKE these medications   apixaban 5 MG Tabs tablet Commonly known as:  ELIQUIS Take 1 tablet (5 mg total) by mouth 2 (two) times daily.   lisinopril 40 MG tablet Commonly known as:  PRINIVIL,ZESTRIL Take 1 tablet (40 mg total) by mouth daily.   metoprolol tartrate 25 MG tablet Commonly known as:  LOPRESSOR Take 1 tablet (25 mg total) by mouth 2 (two) times daily.  nicotine 21 mg/24hr patch Commonly known as:  NICODERM CQ - dosed in mg/24 hours Place 1 patch (21 mg total) onto the skin daily.      Follow-up Information    Buck, Chales Abrahams, NP  Follow up in 1 week(s).   Contact information: [address] [place] [address] [phone]          No Known Allergies      Procedures/Studies: Dg Chest 2 View  Result Date: 11/17/2016 CLINICAL DATA:  Central chest pain EXAM: CHEST  2 VIEW COMPARISON:  09/18/2016 FINDINGS: Heart is mildly enlarged. Lungs are clear. No effusions. No acute bony abnormality. IMPRESSION: Mild cardiomegaly.  No active disease. Electronically Signed   By: Charlett Nose M.D.   On: 11/17/2016 17:23       Subjective: Patient denies further chest pain symptoms. Stable on telemetry overnight.  Discharge Exam: Vitals:   11/17/16 2301 11/18/16 0111  BP: (!) 169/95   Pulse: 66 65  Resp: 20 14  Temp: 97.8 F (36.6 C)    Vitals:   11/17/16 2100 11/17/16 2130 11/17/16 2301 11/18/16 0111  BP: (!) 160/88 (!) 156/86 (!) 169/95   Pulse: 72 70 66 65  Resp: (!) 21 20 20 14   Temp:   97.8 F (36.6 C)   TempSrc:   Oral   SpO2: 98% 98% 93%   Weight:   (!) 145.3 kg (320 lb 4.8 oz)   Height:   6' 5.2" (1.961 m)     General: Middle aged obese male not in distress HEENT: Most mucosa, supple neck Chest: Clear to auscultation bilaterally  CVS: Normal S1-S2, no murmurs rub or gallop GI: Soft, nondistended, nontender Musculoskeletal: Warm, no edema     The results of significant diagnostics from this hospitalization (including imaging, microbiology, ancillary and laboratory) are listed below for reference.     Microbiology: No results found for this or any previous visit (from the past 240 hour(s)).   Labs: BNP (last 3 results) No results for input(s): BNP in the last 8760 hours. Basic Metabolic Panel:  Recent Labs Lab 11/17/16 1706  NA 138  K 3.1*  CL 103  CO2 23  GLUCOSE 124*  BUN 12  CREATININE 0.96  CALCIUM 8.9  MG 2.0  PHOS 4.9*   Liver Function Tests: No results for input(s): AST, ALT, ALKPHOS, BILITOT, PROT, ALBUMIN in the last 168 hours. No results for input(s):  LIPASE, AMYLASE in the last 168 hours. No results for input(s): AMMONIA in the last 168 hours. CBC:  Recent Labs Lab 11/17/16 1706  WBC 13.1*  HGB 15.8  HCT 46.8  MCV 91.8  PLT 227   Cardiac Enzymes:  Recent Labs Lab 11/17/16 2319 11/18/16 0538  TROPONINI <0.03 <0.03   BNP: Invalid input(s): POCBNP CBG:  Recent Labs Lab 11/17/16 2313 11/18/16 0717  GLUCAP 122* 154*   D-Dimer No results for input(s): DDIMER in the last 72 hours. Hgb A1c No results for input(s): HGBA1C in the last 72 hours. Lipid Profile No results for input(s): CHOL, HDL, LDLCALC, TRIG, CHOLHDL, LDLDIRECT in the last 72 hours. Thyroid function studies No results for input(s): TSH, T4TOTAL, T3FREE, THYROIDAB in the last 72 hours.  Invalid input(s): FREET3 Anemia work up No results for input(s): VITAMINB12, FOLATE, FERRITIN, TIBC, IRON, RETICCTPCT in the last 72 hours. Urinalysis    Component Value Date/Time   COLORURINE STRAW (A) 10/15/2016 1522   APPEARANCEUR CLEAR 10/15/2016 1522   LABSPEC 1.003 (L) 10/15/2016 1522  PHURINE 5.0 10/15/2016 1522   GLUCOSEU NEGATIVE 10/15/2016 1522   HGBUR NEGATIVE 10/15/2016 1522   BILIRUBINUR NEGATIVE 10/15/2016 1522   KETONESUR NEGATIVE 10/15/2016 1522   PROTEINUR NEGATIVE 10/15/2016 1522   UROBILINOGEN 0.2 12/21/2014 1538   NITRITE NEGATIVE 10/15/2016 1522   LEUKOCYTESUR NEGATIVE 10/15/2016 1522   Sepsis Labs Invalid input(s): PROCALCITONIN,  WBC,  LACTICIDVEN Microbiology No results found for this or any previous visit (from the past 240 hour(s)).   Time coordinating discharge: < 30 minutes  SIGNED:   Eddie North, MD  [facility] 11/18/2016, 9:32 AM Pager   If 7PM-7AM, please contact night-coverage [email or web address] Password [email or web address]

## 2016-11-20 ENCOUNTER — Encounter: Payer: Self-pay | Admitting: Pediatric Intensive Care

## 2016-11-23 ENCOUNTER — Encounter: Payer: Self-pay | Admitting: Pediatric Intensive Care

## 2016-12-03 ENCOUNTER — Encounter: Payer: Self-pay | Admitting: Pediatric Intensive Care

## 2016-12-04 NOTE — Progress Notes (Deleted)
Cardiology Office Note    Date:  12/04/2016   ID:  Gary Buck, DOB Dec 06, 1965, MRN [id number]  PCP:  Gary Sharps, NP  Cardiologist: Dr. Rennis Golden   No chief complaint on file.   History of Present Illness:    Gary Buck is a 51 y.o. male with past medical history of PAF (on Eliquis), prior CVA, bipolar disorder, ETOH use, and history of cocaine use who presents to the office today for hospital follow-up.   He was recently admitted from 6/2 - 11/18/2016 for evaluation of chest pain. Reported his pain lasted for over two hours and was mostly right-sided, occurring after consuming 4 plates of food at a buffet. Reported last using cocaine 8+ months ago. BP was initially elevated but he reported being out of his medications for the past few weeks. His cardiac enzymes remained negative and EKG showed no acute ischemic changes, therefore outpatient follow-up was recommended.   - echo as outpatient  Past Medical History:  Diagnosis Date  . Anginal pain (HCC)   . Bipolar disorder (HCC)   . Depression   . ETOH abuse   . H/O cocaine abuse   . Hypercholesterolemia   . Hypertension   . PAF (paroxysmal atrial fibrillation) (HCC)    Gary Buck 12/21/2014  . Stroke [facility]) 2004   pt sts about 12 years ago.   . Tobacco abuse   . Type II diabetes mellitus (HCC) dx'd 12/21/2014    Past Surgical History:  Procedure Laterality Date  . CYSTECTOMY  ~ 2004   "on the top of my forehead"  . INGUINAL HERNIA REPAIR Left ~ 2012    Current Medications: Outpatient Medications Prior to Visit  Medication Sig Dispense Refill  . apixaban (ELIQUIS) 5 MG TABS tablet Take 1 tablet (5 mg total) by mouth 2 (two) times daily. 60 tablet 0  . lisinopril (PRINIVIL,ZESTRIL) 40 MG tablet Take 1 tablet (40 mg total) by mouth daily. 30 tablet 0  . metoprolol tartrate (LOPRESSOR) 25 MG tablet Take 1 tablet (25 mg total) by mouth 2 (two) times daily. 60 tablet 0  . nicotine (NICODERM CQ - DOSED IN MG/24 HOURS) 21  mg/24hr patch Place 1 patch (21 mg total) onto the skin daily. 28 patch 0   No facility-administered medications prior to visit.      Allergies:   Patient has no known allergies.   Social History   Social History  . Marital status: Single    Spouse name: N/A  . Number of children: N/A  . Years of education: N/A   Social History Main Topics  . Smoking status: Current Every Day Smoker    Packs/day: 0.50    Years: 32.00    Types: Cigarettes  . Smokeless tobacco: Never Used  . Alcohol use 32.4 oz/week    54 Cans of beer per week     Comment: 12/21/2014 "2-3, 24oz  beers per night during work nights, 6 24oz beer per day on the weekends"  . Drug use: Yes    Types: Marijuana     Comment: 12/21/2014 "last cocaine was ~ 2 months ago"  . Sexual activity: Yes   Other Topics Concern  . Not on file   Social History Narrative  . No narrative on file     Family History:  The patient's ***family history includes Diabetes Mellitus II in his mother.   Review of Systems:   Please see the history of present illness.     General:  No  chills, fever, night sweats or weight changes.  Cardiovascular:  No chest pain, dyspnea on exertion, edema, orthopnea, palpitations, paroxysmal nocturnal dyspnea. Dermatological: No rash, lesions/masses Respiratory: No cough, dyspnea Urologic: No hematuria, dysuria Abdominal:   No nausea, vomiting, diarrhea, bright red blood per rectum, melena, or hematemesis Neurologic:  No visual changes, wkns, changes in mental status. All other systems reviewed and are otherwise negative except as noted above.   Physical Exam:    VS:  There were no vitals taken for this visit.   General: Well developed, well nourished,male appearing in no acute distress. Head: Normocephalic, atraumatic, sclera non-icteric, no xanthomas, nares are without discharge.  Neck: No carotid bruits. JVD not elevated.  Lungs: Respirations regular and unlabored, without wheezes or rales.    Heart: ***Regular rate and rhythm. No S3 or S4.  No murmur, no rubs, or gallops appreciated. Abdomen: Soft, non-tender, non-distended with normoactive bowel sounds. No hepatomegaly. No rebound/guarding. No obvious abdominal masses. Msk:  Strength and tone appear normal for age. No joint deformities or effusions. Extremities: No clubbing or cyanosis. No edema.  Distal pedal pulses are 2+ bilaterally. Neuro: Alert and oriented X 3. Moves all extremities spontaneously. No focal deficits noted. Psych:  Responds to questions appropriately with a normal affect. Skin: No rashes or lesions noted  Wt Readings from Last 3 Encounters:  11/17/16 (!) 320 lb 4.8 oz (145.3 kg)  10/15/16 (!) 325 lb (147.4 kg)  07/20/16 (!) 305 lb (138.3 kg)        Studies/Labs Reviewed:   EKG:  EKG is*** ordered today.  The ekg ordered today demonstrates ***  Recent Labs: 06/30/2016: TSH 2.104 10/15/2016: ALT 12 11/17/2016: BUN 12; Creatinine, Ser 0.96; Hemoglobin 15.8; Magnesium 2.0; Platelets 227; Potassium 3.1; Sodium 138   Lipid Panel    Component Value Date/Time   CHOL 169 06/30/2016 0622   TRIG 83 06/30/2016 0622   HDL 62 06/30/2016 0622   CHOLHDL 2.7 06/30/2016 0622   VLDL 17 06/30/2016 0622   LDLCALC 90 06/30/2016 0622    Additional studies/ records that were reviewed today include:   Echocardiogram: 12/22/2014 Study Conclusions  - Left ventricle: The cavity size was normal. There was moderate   concentric hypertrophy. Systolic function was normal. The   estimated ejection fraction was in the range of 60% to 65%.   Although no diagnostic regional wall motion abnormality was   identified, this possibility cannot be completely excluded on the   basis of this study. - Left atrium: The atrium was moderately dilated.  Impressions:  - Compared to the prior study, there has been no significant   interval change.  Assessment:    No diagnosis found.   Plan:   In order of problems listed  above:  1. ***    Medication Adjustments/Labs and Tests Ordered: Current medicines are reviewed at length with the patient today.  Concerns regarding medicines are outlined above.  Medication changes, Labs and Tests ordered today are listed in the Patient Instructions below. There are no Patient Instructions on file for this visit.   Signed, Ellsworth LennoxBrittany M Strader, PA-C  12/04/2016 7:55 AM    [facility] [address] [place], [place]  [address] Phone: [phone]; Fax: [phone]) [phone]  [address], [address] [place], [place] [address] Phone: [phone]

## 2016-12-05 ENCOUNTER — Encounter: Payer: Self-pay | Admitting: *Deleted

## 2016-12-05 ENCOUNTER — Ambulatory Visit: Payer: Self-pay | Admitting: Student

## 2016-12-13 NOTE — Congregational Nurse Program (Signed)
Congregational Nurse Program Note  Date of Encounter: 12/10/2016  Past Medical History: Past Medical History:  Diagnosis Date  . Anginal pain (HCC)   . Bipolar disorder (HCC)   . Depression   . ETOH abuse   . H/O cocaine abuse   . Hypercholesterolemia   . Hypertension   . PAF (paroxysmal atrial fibrillation) (HCC)    [name]/notes 12/21/2014  . Stroke [facility](HCC) 2004   pt sts about 12 years ago.   . Tobacco abuse   . Type II diabetes mellitus (HCC) dx'd 12/21/2014    Encounter Details:     CNP Questionnaire - 12/10/16 1643      Patient Demographics   Is this a new or existing patient? New   Patient is considered a/an Not Applicable   Race African-American/Black     Patient Assistance   Location of Patient Assistance Not Applicable   Patient's financial/insurance status Low Income;Self-Pay (Uninsured)   Uninsured Patient ([employer], trade unionCard/Care Connects) Yes   Interventions Not Applicable   Patient referred to apply for the following financial assistance [employer]   Food insecurities addressed Not Applicable   Transportation assistance No   Assistance securing medications No   Educational health offerings Cardiac disease;Other;Hypertension     Encounter Details   Primary purpose of visit Chronic Illness/Condition Visit   Was an Emergency Department visit averted? Yes   Does patient have a medical provider? Yes   Patient referred to Clinic   Was a mental health screening completed? (GAINS tool) No   Does patient have dental issues? No   Does patient have vision issues? No   Does your patient have an abnormal blood pressure today? Yes   Since previous encounter, have you referred patient for abnormal blood pressure that resulted in a new diagnosis or medication change? No   Does your patient have an abnormal blood glucose today? No   Since previous encounter, have you referred patient for abnormal blood glucose that resulted in a new diagnosis or medication change? No   Was there a life-saving intervention made? No     Requested B/P check.  170/100.  States had just finished smoking.  Discussed with him the importance of stopping smoking.  Instructed client to have B/P rechecked by CNP nurse Shann MedalVictoria Hussey in the AM.

## 2016-12-20 NOTE — Congregational Nurse Program (Signed)
Congregational Nurse Program Note  Date of Encounter: 11/20/2016  Past Medical History: Past Medical History:  Diagnosis Date  . Anginal pain (HCC)   . Bipolar disorder (HCC)   . Depression   . ETOH abuse   . H/O cocaine abuse   . Hypercholesterolemia   . Hypertension   . PAF (paroxysmal atrial fibrillation) (HCC)    [name]/notes 12/21/2014  . Stroke [facility](HCC) 2004   pt sts about 12 years ago.   . Tobacco abuse   . Type II diabetes mellitus (HCC) dx'd 12/21/2014    Encounter Details:     CNP Questionnaire - 12/10/16 1643      Patient Demographics   Is this a new or existing patient? New   Patient is considered a/an Not Applicable   Race African-American/Black     Patient Assistance   Location of Patient Assistance Not Applicable   Patient's financial/insurance status Low Income;Self-Pay (Uninsured)   Uninsured Patient ([employer] officer, trade unionCard/Care Connects) Yes   Interventions Not Applicable   Patient referred to apply for the following financial assistance [employer]/Care Connect[employer]   Food insecurities addressed Not Applicable   Transportation assistance No   Assistance securing medications No   Educational health offerings Cardiac disease;Other;Hypertension     Encounter Details   Primary purpose of visit Chronic Illness/Condition Visit   Was an Emergency Department visit averted? Yes   Does patient have a medical provider? Yes   Patient referred to Clinic   Was a mental health screening completed? (GAINS tool) No   Does patient have dental issues? No   Does patient have vision issues? No   Does your patient have an abnormal blood pressure today? Yes   Since previous encounter, have you referred patient for abnormal blood pressure that resulted in a new diagnosis or medication change? No   Does your patient have an abnormal blood glucose today? No   Since previous encounter, have you referred patient for abnormal blood glucose that resulted in a new diagnosis or medication change? No   Was there a life-saving intervention made? No     BP check. Client states that he saw PCP yesterday. CN encouraged client to take BP medication regularly and follow up with PCP. Bus passes [facility]

## 2016-12-24 NOTE — Congregational Nurse Program (Signed)
Congregational Nurse Program Note  Date of Encounter: 12/03/2016  Past Medical History: Past Medical History:  Diagnosis Date  . Anginal pain (HCC)   . Bipolar disorder (HCC)   . Depression   . ETOH abuse   . H/O cocaine abuse   . Hypercholesterolemia   . Hypertension   . PAF (paroxysmal atrial fibrillation) (HCC)    [name]/notes 12/21/2014  . Stroke [facility](HCC) 2004   pt sts about 12 years ago.   . Tobacco abuse   . Type II diabetes mellitus (HCC) dx'd 12/21/2014    Encounter Details:     CNP Questionnaire - 12/10/16 1643      Patient Demographics   Is this a new or existing patient? New   Patient is considered a/an Not Applicable   Race African-American/Black     Patient Assistance   Location of Patient Assistance Not Applicable   Patient's financial/insurance status Low Income;Self-Pay (Uninsured)   Uninsured Patient ([employer] officer, trade unionCard/Care Connects) Yes   Interventions Not Applicable   Patient referred to apply for the following financial assistance [employer]/Care Connect[employer]   Food insecurities addressed Not Applicable   Transportation assistance No   Assistance securing medications No   Educational health offerings Cardiac disease;Other;Hypertension     Encounter Details   Primary purpose of visit Chronic Illness/Condition Visit   Was an Emergency Department visit averted? Yes   Does patient have a medical provider? Yes   Patient referred to Clinic   Was a mental health screening completed? (GAINS tool) No   Does patient have dental issues? No   Does patient have vision issues? No   Does your patient have an abnormal blood pressure today? Yes   Since previous encounter, have you referred patient for abnormal blood pressure that resulted in a new diagnosis or medication change? No   Does your patient have an abnormal blood glucose today? No   Since previous encounter, have you referred patient for abnormal blood glucose that resulted in a new diagnosis or medication change? No   Was there a life-saving intervention made? No    BP check. Client states that he is taking his BP medication regularly. Denies headache, visual changes. Will return to CN clinic next week for BP re-check.

## 2016-12-24 NOTE — Congregational Nurse Program (Signed)
Congregational Nurse Program Note  Date of Encounter: 11/23/2016  Past Medical History: Past Medical History:  Diagnosis Date  . Anginal pain (HCC)   . Bipolar disorder (HCC)   . Depression   . ETOH abuse   . H/O cocaine abuse   . Hypercholesterolemia   . Hypertension   . PAF (paroxysmal atrial fibrillation) (HCC)    Gary Buck/notes 12/21/2014  . Stroke [facility](HCC) 2004   pt sts about 12 years ago.   . Tobacco abuse   . Type II diabetes mellitus (HCC) dx'd 12/21/2014    Encounter Details:     CNP Questionnaire - 12/10/16 1643      Patient Demographics   Is this a new or existing patient? New   Patient is considered a/an Not Applicable   Race African-American/Black     Patient Assistance   Location of Patient Assistance Not Applicable   Patient's financial/insurance status Low Income;Self-Pay (Uninsured)   Uninsured Patient ([employer] officer, trade unionCard/Care Connects) Yes   Interventions Not Applicable   Patient referred to apply for the following financial assistance [employer]/Care Connects   Food insecurities addressed Not Applicable   Transportation assistance No   Assistance securing medications No   Educational health offerings Cardiac disease;Other;Hypertension     Encounter Details   Primary purpose of visit Chronic Illness/Condition Visit   Was an Emergency Department visit averted? Yes   Does patient have a medical provider? Yes   Patient referred to Clinic   Was a mental health screening completed? (GAINS tool) No   Does patient have dental issues? No   Does patient have vision issues? No   Does your patient have an abnormal blood pressure today? Yes   Since previous encounter, have you referred patient for abnormal blood pressure that resulted in a new diagnosis or medication change? No   Does your patient have an abnormal blood glucose today? No   Since previous encounter, have you referred patient for abnormal blood glucose that resulted in a new diagnosis or medication change? No   Was there a life-saving intervention made? No    BP check. Client states that he took his BP medication this morning.

## 2016-12-25 ENCOUNTER — Encounter: Payer: Self-pay | Admitting: Pediatric Intensive Care

## 2017-01-22 ENCOUNTER — Encounter: Payer: Self-pay | Admitting: Pediatric Intensive Care

## 2017-01-23 NOTE — Congregational Nurse Program (Signed)
Congregational Nurse Program Note  Date of Encounter: 01/23/2017  Past Medical History: Past Medical History:  Diagnosis Date  . Anginal pain (HCC)   . Bipolar disorder (HCC)   . Depression   . ETOH abuse   . H/O cocaine abuse   . Hypercholesterolemia   . Hypertension   . PAF (paroxysmal atrial fibrillation) (HCC)    [name]/notes 12/21/2014  . Stroke [facility](HCC) 2004   pt sts about 12 years ago.   . Tobacco abuse   . Type II diabetes mellitus (HCC) dx'd 12/21/2014    Encounter Details:     CNP Questionnaire - 01/23/17 1908      Patient Demographics   Is this a new or existing patient? Existing   Patient is considered a/an Not Applicable   Race African-American/Black     Patient Assistance   Location of Patient Assistance Not Applicable   Patient's financial/insurance status Self-Pay (Uninsured)   Patient referred to apply for the following financial assistance [facility]/Care Connects Renewal   Food insecurities addressed Not Applicable   Transportation assistance Yes   Type of Assistance [employer] Group[employer] Pass Given   Assistance securing medications Yes   Type of Assistance [place] [facility]   Educational health offerings Hypertension;Chronic disease;Diabetes;Medications     Encounter Details   Primary purpose of visit Chronic Illness/Condition Visit;Navigating the Healthcare System;Education/Health Concerns;Spiritual Care/Support Visit   Was an Emergency Department visit averted? Not Applicable   Does patient have a medical provider? Yes   Patient referred to Clinic;Area Agency;Establish PCP   Was a mental health screening completed? (GAINS tool) No   Does patient have dental issues? No   Does patient have vision issues? No   Does your patient have an abnormal blood pressure today? Yes   Since previous encounter, have you referred patient for abnormal blood pressure that resulted in a new diagnosis or medication change? No   Does your patient have an abnormal blood glucose  today? No   Since previous encounter, have you referred patient for abnormal blood glucose that resulted in a new diagnosis or medication change? No   Was there a life-saving intervention made? No     client has a history of non compliance when it comes to his blood pressure ,taking his medications and keeping appointments. Client admits to all of the above and states he will do better. Nurse counseled regarding high blood pressure kidney disease and dialysis ,states his mother is on dialysis so he understands what can happen if he does not get care. Smoker about 1/2 pack per day ,counseled to decrease ,states he has decreased from 1 pack per day . Just in from dinner did not check his sugar ,will check nxet week as client is to see me weekly to monitor his blood pressure and remind him of [facility] Bus tickets  [facility]  Pressure medication  Nurse will call to see if med's available tomorrow

## 2017-01-24 NOTE — Congregational Nurse Program (Signed)
Congregational Nurse Program Note  Date of Encounter: 12/25/2016  Past Medical History: Past Medical History:  Diagnosis Date  . Anginal pain (HCC)   . Bipolar disorder (HCC)   . Depression   . ETOH abuse   . H/O cocaine abuse   . Hypercholesterolemia   . Hypertension   . PAF (paroxysmal atrial fibrillation) (HCC)    [name]/notes 12/21/2014  . Stroke [facility](HCC) 2004   pt sts about 12 years ago.   . Tobacco abuse   . Type II diabetes mellitus (HCC) dx'd 12/21/2014    Encounter Details: BP check. Client states he's been out of medication for 1 week. [facility] CN encouraged client to call [facility] to refill meds and follow [facility] as scheduled.

## 2017-01-25 ENCOUNTER — Encounter: Payer: Self-pay | Admitting: Pediatric Intensive Care

## 2017-01-29 NOTE — Congregational Nurse Program (Signed)
Congregational Nurse Program Note  Date of Encounter: 01/29/2017  Past Medical History: Past Medical History:  Diagnosis Date  . Anginal pain (HCC)   . Bipolar disorder (HCC)   . Depression   . ETOH abuse   . H/O cocaine abuse   . Hypercholesterolemia   . Hypertension   . PAF (paroxysmal atrial fibrillation) (HCC)    [name]/notes 12/21/2014  . Stroke [facility](HCC) 2004   pt sts about 12 years ago.   . Tobacco abuse   . Type II diabetes mellitus (HCC) dx'd 12/21/2014    Encounter Details:     CNP Questionnaire - 01/29/17 1806      Patient Demographics   Is this a new or existing patient? Existing   Patient is considered a/an Not Applicable   Race African-American/Black     Patient Assistance   Location of Patient Assistance Not Applicable   Patient's financial/insurance status Self-Pay (Uninsured)   Uninsured Patient ([facility] Card/Care Connects) Yes   Interventions Appt. has been completed;Follow-up/Education/Support provided after completed appt.   Patient referred to apply for the following financial assistance [facility] Card/Care [employer]s Renewal   Food insecurities addressed Not Technical brewerApplicable   Transportation assistance No   Assistance securing medications Yes   Type of Assistance [facility]   Educational health offerings Chronic disease;Hypertension;Medications;Navigating the healthcare system;Diabetes     Encounter Details   Primary purpose of visit Spiritual Care/Support Visit;Education/Health Concerns;Chronic Illness/Condition Visit;Navigating the Healthcare System   Was an Emergency Department visit averted? Yes   Does patient have a medical provider? Yes   Patient referred to Clinic;Area Agency;Establish PCP   Was a mental health screening completed? (GAINS tool) No   Does patient have dental issues? No   Does patient have vision issues? No   Does your patient have an abnormal blood pressure today? Yes   Since previous encounter, have you referred patient  for abnormal blood pressure that resulted in a new diagnosis or medication change? No   Does your patient have an abnormal blood glucose today? No   Since previous encounter, have you referred patient for abnormal blood glucose that resulted in a new diagnosis or medication change? No   Was there a life-saving intervention made? Yes    Non compliant client ,Nurse called  [facility] to check to see if client had picked up medications ,they said no ,would fill them and nurse will pick them up this pm. Client in to see nurse states they wouldn't give him the medications ,he went there and said nonne had called them in . Nurse informed client that she would  Go pick up his medication as his blood  Pressure is extremely high. Medications picked up and started  this pm  metoprolol tart 25 mg  two times a day  Lisinopril 40 mg 1 every day  Atorvastatin 20 mg  1/2 tablet at bedtime Client took his first dose of medication as nurse observed and counseled on importance . Will check blood  Pressure and blood  Sugar tomorrow as client had already been to dinner tomorrow Again counseled regarding his high blood pressure but he is known to be non compliant  And had been off his medications for months.  Client back in and blood  Pressure taken @ 6:29 pm    179/100 pulse 73  Client is a smoker ,counseled , states he plans to  Stop Follow up tomorrow

## 2017-01-30 DIAGNOSIS — E0800 Diabetes mellitus due to underlying condition with hyperosmolarity without nonketotic hyperglycemic-hyperosmolar coma (NKHHC): Secondary | ICD-10-CM

## 2017-01-30 LAB — GLUCOSE, POCT (MANUAL RESULT ENTRY): POC Glucose: 97 mg/dl (ref 70–99)

## 2017-01-30 NOTE — Congregational Nurse Program (Signed)
Congregational Nurse Program Note  Date of Encounter: 01/30/2017  Past Medical History: Past Medical History:  Diagnosis Date  . Anginal pain (HCC)   . Bipolar disorder (HCC)   . Depression   . ETOH abuse   . H/O cocaine abuse   . Hypercholesterolemia   . Hypertension   . PAF (paroxysmal atrial fibrillation) (HCC)    [name]/notes 12/21/2014  . Stroke [facility](HCC) 2004   pt sts about 12 years ago.   . Tobacco abuse   . Type II diabetes mellitus (HCC) dx'd 12/21/2014    Encounter Details:     CNP Questionnaire - 01/30/17 1912      Patient Demographics   Is this a new or existing patient? Existing   Race African-American/Black     Patient Assistance   Location of Patient Assistance Not Applicable   Patient's financial/insurance status Self-Pay (Uninsured)   Uninsured Patient ([facility] Card/Care Connects) Yes   Interventions Follow-up/Education/Support provided after completed appt.;Appt. has been completed   Patient referred to apply for the following financial assistance Not Applicable   Food insecurities addressed Not Applicable   Transportation assistance No   Assistance securing medications No   Educational health offerings Hypertension;Chronic disease;Diabetes;Medications;Navigating the healthcare system;Safety;Nutrition     Encounter Details   Primary purpose of visit Spiritual Care/Support Visit;Chronic Illness/Condition Visit;Education/Health Concerns   Was an Emergency Department visit averted? Not Applicable   Does patient have a medical provider? Yes   Patient referred to Area Agency;Clinic;Establish PCP   Was a mental health screening completed? (GAINS tool) No   Does patient have dental issues? No   Does patient have vision issues? No   Does your patient have an abnormal blood pressure today? Yes   Since previous encounter, have you referred patient for abnormal blood pressure that resulted in a new diagnosis or medication change? No   Does your patient have an abnormal  blood glucose today? No   Since previous encounter, have you referred patient for abnormal blood glucose that resulted in a new diagnosis or medication change? No   Was there a life-saving intervention made? No     Client in  For follow up visit started on blood  Pressure and cholesterol medications yesterday. Looking at vitals today ,blood pressure down some today ,will monitor closely until appointment with  PCP on 02-12-17 . Screening for diabetes done result  97 mg counseled , client is a smoker and smoked 11/2 hours before having blood pressure checked ,1/2 pack or more per day . Understands side effects of smoking and of not taking his medications ,has been counseled repeatly  . Commennded on follow up today with nurse.

## 2017-02-05 NOTE — Congregational Nurse Program (Signed)
Congregational Nurse Program Note  Date of Encounter: 02/05/2017  Past Medical History: Past Medical History:  Diagnosis Date  . Anginal pain (HCC)   . Bipolar disorder (HCC)   . Depression   . ETOH abuse   . H/O cocaine abuse   . Hypercholesterolemia   . Hypertension   . PAF (paroxysmal atrial fibrillation) (HCC)    Gary Buck 12/21/2014  . Stroke [facility]) 2004   pt sts about 12 years ago.   . Tobacco abuse   . Type II diabetes mellitus (HCC) dx'd 12/21/2014    Encounter Details:     CNP Questionnaire - 02/05/17 1701      Patient Demographics   Is this a new or existing patient? Existing   Patient is considered a/an Not Applicable   Race African-American/Black     Patient Assistance   Location of Patient Assistance Not Applicable   Patient's financial/insurance status Self-Pay (Uninsured)   Uninsured Patient ([employer] Research officer, trade union) Yes   Interventions Not Applicable   Patient referred to apply for the following financial assistance Not Applicable   Food insecurities addressed Not Applicable   Transportation assistance Yes   Type of Assistance Bus Pass Given   Assistance securing medications No   Educational health offerings Hypertension;Medications;Navigating the healthcare system     Encounter Details   Primary purpose of visit Education/Health Concerns;Chronic Illness/Condition Visit;Spiritual Care/Support Visit   Was an Emergency Department visit averted? Not Applicable   Does patient have a medical provider? Yes   Patient referred to Clinic;Establish PCP   Was a mental health screening completed? (GAINS tool) No   Does patient have dental issues? No   Does patient have vision issues? No   Does your patient have an abnormal blood pressure today? Yes   Since previous encounter, have you referred patient for abnormal blood pressure that resulted in a new diagnosis or medication change? No   Does your patient have an abnormal blood glucose today? No   Since previous  encounter, have you referred patient for abnormal blood glucose that resulted in a new diagnosis or medication change? No   Was there a life-saving intervention made? No    Client in for follow up on blood  Pressure ,no smoking before B/P check but states I will smoke right afterwards ,counseled. Blood pressure really down since he was started on blood pressure medications ,he understands need to take medications. Bus pass for his PCP appointment is 02-12-17 @ 2;45 pm bus  Passes given and reminded to take his medications  And keep his appointment .  Follow up next week .

## 2017-02-06 NOTE — Congregational Nurse Program (Signed)
Congregational Nurse Program Note  Date of Encounter: 02/06/2017  Past Medical History: Past Medical History:  Diagnosis Date  . Anginal pain (HCC)   . Bipolar disorder (HCC)   . Depression   . ETOH abuse   . H/O cocaine abuse   . Hypercholesterolemia   . Hypertension   . PAF (paroxysmal atrial fibrillation) (HCC)    [name] 12/21/2014  . Stroke [facility]) 2004   pt sts about 12 years ago.   . Tobacco abuse   . Type II diabetes mellitus (HCC) dx'd 12/21/2014    Encounter Details:     CNP Questionnaire - 02/06/17 1723      Patient Demographics   Is this a new or existing patient? Existing   Patient is considered a/an Not Applicable   Race African-American/Black     Patient Assistance   Location of Patient Assistance Not Applicable   Patient's financial/insurance status Self-Pay (Uninsured)   Uninsured Patient ([employer] Research officer, trade union) No   Interventions Appt. has been completed   Patient referred to apply for the following financial assistance [employer] insecurities addressed Not Applicable   Transportation assistance No   Assistance securing medications No   Educational health offerings Medications;Hypertension     Encounter Details   Primary purpose of visit Education/Health Concerns;Chronic Illness/Condition Visit   Was an Emergency Department visit averted? Not Applicable   Does patient have a medical provider? Yes   Patient referred to Clinic;Area Agency;Establish PCP   Was a mental health screening completed? (GAINS tool) No   Does patient have dental issues? No   Does patient have vision issues? No   Does your patient have an abnormal blood pressure today? Yes   Since previous encounter, have you referred patient for abnormal blood pressure that resulted in a new diagnosis or medication change? No   Does your patient have an abnormal blood glucose today? No   Since previous encounter, have you referred patient for abnormal blood glucose that  resulted in a new diagnosis or medication change? No   Was there a life-saving intervention made? No    Client in today to have his blood  Pressure checked ,blood pressure up today ,states he took his am medications will take pm , states no smoking recently, counseled regarding his blood pressure today ,to see MD next week ,to take his medications with him. Re took blood pressure @ 5;31 pm 187/106 ,pulse 90  Informed nurse that he had 3 energy drinks today ,cautioned him to stop those and counseled regarding  the effects  Energy drinks  have on the body and heart !!  Drink more water .!!!

## 2017-02-08 ENCOUNTER — Encounter: Payer: Self-pay | Admitting: Pediatric Intensive Care

## 2017-02-12 ENCOUNTER — Ambulatory Visit (INDEPENDENT_AMBULATORY_CARE_PROVIDER_SITE_OTHER): Payer: Self-pay | Admitting: Physician Assistant

## 2017-02-12 ENCOUNTER — Encounter (INDEPENDENT_AMBULATORY_CARE_PROVIDER_SITE_OTHER): Payer: Self-pay | Admitting: Physician Assistant

## 2017-02-12 VITALS — BP 192/103 | HR 83 | Temp 97.9°F | Wt 329.0 lb

## 2017-02-12 DIAGNOSIS — I10 Essential (primary) hypertension: Secondary | ICD-10-CM

## 2017-02-12 DIAGNOSIS — E279 Disorder of adrenal gland, unspecified: Secondary | ICD-10-CM

## 2017-02-12 DIAGNOSIS — E278 Other specified disorders of adrenal gland: Secondary | ICD-10-CM

## 2017-02-12 DIAGNOSIS — R3915 Urgency of urination: Secondary | ICD-10-CM

## 2017-02-12 LAB — POCT URINALYSIS DIPSTICK
BILIRUBIN UA: NEGATIVE
Blood, UA: NEGATIVE
GLUCOSE UA: NEGATIVE
LEUKOCYTES UA: NEGATIVE
Nitrite, UA: NEGATIVE
Protein, UA: NEGATIVE
Spec Grav, UA: >=1.03 — AB (ref 1.010–1.025)
Urobilinogen, UA: 1 E.U./dL
pH, UA: 5.5 (ref 5.0–8.0)

## 2017-02-12 MED ORDER — AMLODIPINE BESYLATE 10 MG PO TABS: 10.0000 mg | ORAL_TABLET | Freq: Every day | ORAL | 1 refills | Status: DC

## 2017-02-12 MED ORDER — HYDROCHLOROTHIAZIDE 25 MG PO TABS: 25.0000 mg | ORAL_TABLET | Freq: Every day | ORAL | 1 refills | Status: DC

## 2017-02-12 MED FILL — AMLODIPINE BESYLATE 10 MG T: 10 | 30 days supply | Qty: 30 | Fill #0

## 2017-02-12 MED FILL — HYDROCHLOROTHIAZIDE 25 MG T: 25 | 30 days supply | Qty: 30 | Fill #0

## 2017-02-12 NOTE — Progress Notes (Signed)
Subjective:  Patient ID: Gary Buck, male    DOB: 1966/05/18  Age: 51 y.o. MRN: [id number]  CC: new patient  HPI Gary Buck is a 51 y.o. male with a medical history of HTN, HLD, DM2, paroxysmal Afib, stroke, bipolar disorder, depression, ETOH abuse, cocaine use, and tobacco abuse presents to address his HTN. Takes two anti-hypertensive pills which do not control BP enough. Feels urinary urgency at times; no other urinary symptoms. Denies CP, palpitations, SOB, HA, abdominal pain, neurological deficits/symptoms, or GI sxs.    Review of medical imaging revealed an incidental and suspicious adrenal mass on left side.     Outpatient Medications Prior to Visit  Medication Sig Dispense Refill  . apixaban (ELIQUIS) 5 MG TABS tablet Take 1 tablet (5 mg total) by mouth 2 (two) times daily. 60 tablet 0  . lisinopril (PRINIVIL,ZESTRIL) 40 MG tablet Take 1 tablet (40 mg total) by mouth daily. 30 tablet 0  . metoprolol tartrate (LOPRESSOR) 25 MG tablet Take 1 tablet (25 mg total) by mouth 2 (two) times daily. 60 tablet 0  . nicotine (NICODERM CQ - DOSED IN MG/24 HOURS) 21 mg/24hr patch Place 1 patch (21 mg total) onto the skin daily. 28 patch 0   No facility-administered medications prior to visit.      ROS Review of Systems  Constitutional: Negative for chills, fever and malaise/fatigue.  Eyes: Negative for blurred vision.  Respiratory: Negative for shortness of breath.   Cardiovascular: Negative for chest pain and palpitations.  Gastrointestinal: Negative for abdominal pain and nausea.  Genitourinary: Positive for urgency. Negative for dysuria and hematuria.  Musculoskeletal: Negative for joint pain and myalgias.  Skin: Negative for rash.  Neurological: Negative for tingling and headaches.  Psychiatric/Behavioral: Negative for depression. The patient is not nervous/anxious.     Objective:  BP (!) 192/103 (BP Location: Left Arm, Patient Position: Sitting, Cuff Size: Large)    Pulse 83   Temp 97.9 F (36.6 C) (Oral)   Wt (!) 329 lb (149.2 kg)   SpO2 98%   BMI 38.81 kg/m   BP/Weight 02/12/2017 02/05/2017 01/30/2017  Systolic BP 192 138 158  Diastolic BP 103 88 102  Wt. (Lbs) 329 - -  BMI 38.81 - -  Some encounter information is confidential and restricted. Go to Review Flowsheets activity to see all data.      Physical Exam  Constitutional: He is oriented to person, place, and time.  Obese, NAD, polite  HENT:  Head: Normocephalic and atraumatic.  Eyes: Conjunctivae are normal. No scleral icterus.  Cardiovascular: Normal rate, regular rhythm and normal heart sounds.   Trace pitting edema of the lower extremities bilaterally  Pulmonary/Chest: Effort normal and breath sounds normal.  Musculoskeletal: He exhibits no edema.  Neurological: He is alert and oriented to person, place, and time. No cranial nerve deficit. Coordination normal.  Skin: Skin is warm and dry. No rash noted. No erythema. No pallor.  Psychiatric: He has a normal mood and affect. His behavior is normal. Thought content normal.  Vitals reviewed.    Assessment & Plan:   1. Hypertension, unspecified type - Begin hydrochlorothiazide (HYDRODIURIL) 25 MG tablet; Take 1 tablet (25 mg total) by mouth daily. Take on tablet in the morning.  Dispense: 90 tablet; Refill: 1 - Begin amLODipine (NORVASC) 10 MG tablet; Take 1 tablet (10 mg total) by mouth daily.  Dispense: 90 tablet; Refill: 1 - Continue on Metoprolol and Lisinopril  2. Urinary urgency - Urinalysis Dipstick negative in clinic  today.  3. Left adrenal mass (HCC) - US Renal; Future     Meds ordered this encounter  Medications  . hydrochlorothiazide (HYDRODIURIL) 25 MG tablet    Sig: Take 1 tablet (25 mg total) by mouth daily. Take on tablet in the morning.    Dispense:  90 tablet    Refill:  1    Order Specific Question:   Supervising Provider    Answer:   Quentin Angst [id number]  . amLODipine (NORVASC) 10 MG  tablet    Sig: Take 1 tablet (10 mg total) by mouth daily.    Dispense:  90 tablet    Refill:  1    Order Specific Question:   Supervising Provider    Answer:   Quentin Angst [[id number]]    Follow-up: 4 weeks   Loletta Specter PA

## 2017-02-12 NOTE — Congregational Nurse Program (Signed)
Congregational Nurse Program Note  Date of Encounter: 02/12/2017  Past Medical History: Past Medical History:  Diagnosis Date  . Anginal pain (HCC)   . Bipolar disorder (HCC)   . Depression   . ETOH abuse   . H/O cocaine abuse   . Hypercholesterolemia   . Hypertension   . PAF (paroxysmal atrial fibrillation) (HCC)    [name] 12/21/2014  . Stroke [facility]) 2004   pt sts about 12 years ago.   . Tobacco abuse   . Type II diabetes mellitus (HCC) dx'd 12/21/2014    Encounter Details:     CNP Questionnaire - 02/12/17 1832      Patient Demographics   Is this a new or existing patient? Existing   Patient is considered a/an Not Applicable   Race African-American/Black     Patient Assistance   Location of Patient Assistance Not Applicable   Patient's financial/insurance status [employer] officer, trade union;Self-Pay (Uninsured)   Uninsured Patient ([facility] Card/Care Connects) Yes   Interventions Appt. has been completed;Follow-up/Education/Support provided after completed appt.   Patient referred to apply for the following financial assistance [employer] insecurities addressed Not Applicable   Transportation assistance Yes   Type of Assistance [employer] Given   Assistance securing medications No   Educational health offerings Chronic disease;Medications;Hypertension;Navigating the healthcare system     Encounter Details   Primary purpose of visit Spiritual Care/Support Visit;Education/Health Concerns;Chronic Illness/Condition Visit;Post PCP Visit   Was an Emergency Department visit averted? Not Applicable   Does patient have a medical provider? Yes   Patient referred to Clinic;Area Agency;Follow up with established PCP   Was a mental health screening completed? (GAINS tool) No   Does patient have dental issues? No   Does patient have vision issues? No   Does your patient have an abnormal blood pressure today? No   Since previous encounter, have you referred patient  for abnormal blood pressure that resulted in a new diagnosis or medication change? No   Does your patient have an abnormal blood glucose today? No   Since previous encounter, have you referred patient for abnormal blood glucose that resulted in a new diagnosis or medication change? No   Was there a life-saving intervention made? No         Clinical Intake - 02/12/17 1436      Pre-visit preparation   Pre-visit preparation completed Yes     Abuse/Neglect   Do you feel unsafe in your current relationship? No   Do you feel physically threatened by others? No   Anyone hurting you at home, work, or school? No   Unable to ask? No     Patient Literacy   How often do you need to have someone help you when you read instructions, pamphlets, or other written materials from your doctor or pharmacy? 1 - Never   What is the last grade level you completed in school? 2 years of colleges      Web designer Needed? No    Client in past his visit past visit with his established PCP today ,client kept appointment . States he liked the clinic and will return for his next visit . Referred for Renal study on 02-20-17 @2 ;30 pm bus passes given and completed transportation  form for [facility] transportation and gave client passes for bus next weeks appointment. Complimented client on keeping appointment . Blood pressure at that visit was very high 192/103 ,again counseled and nurse feels client knows  dangers of high blood pressure but  Md also emphasized to him dangers today  . List of medications added given to nurse and will assist him with picking them up tomorrow at [facility] or out patient clinic. Follow weekly

## 2017-02-12 NOTE — Patient Instructions (Signed)

## 2017-02-13 ENCOUNTER — Telehealth: Payer: Self-pay

## 2017-02-13 NOTE — Telephone Encounter (Signed)
TC to [facility]  Medications were ready ,nurse will pick up for client .

## 2017-02-13 NOTE — Congregational Nurse Program (Signed)
Congregational Nurse Program Note  Date of Encounter: 02/13/2017  Past Medical History: Past Medical History:  Diagnosis Date  . Anginal pain (HCC)   . Bipolar disorder (HCC)   . Depression   . ETOH abuse   . H/O cocaine abuse   . Hypercholesterolemia   . Hypertension   . PAF (paroxysmal atrial fibrillation) (HCC)    [name] 12/21/2014  . Stroke [facility]) 2004   pt sts about 12 years ago.   . Tobacco abuse   . Type II diabetes mellitus (HCC) dx'd 12/21/2014    Encounter Details:     CNP Questionnaire - 02/13/17 1732      Patient Demographics   Is this a new or existing patient? Existing   Race African-American/Black     Patient Assistance   Location of Patient Assistance Not Applicable   Patient's financial/insurance status Self-Pay (Uninsured)   Uninsured Patient ([place] Card/Care Connects) Yes   Interventions Follow-up/Education/Support provided after completed appt.;Appt. has been completed   Patient referred to apply for the following financial assistance [employer] insecurities addressed Not Applicable   Transportation assistance No   Assistance securing medications Yes   Type of Assistance Other   Educational health offerings Medications;Hypertension;Chronic disease;Navigating the healthcare system     Encounter Details   Primary purpose of visit Education/Health Concerns;Spiritual Care/Support Visit;Navigating the Healthcare System;Chronic Illness/Condition Visit;Post PCP Visit   Was an Emergency Department visit averted? Yes   Does patient have a medical provider? Yes   Patient referred to Follow up with established PCP   Was a mental health screening completed? (GAINS tool) No   Does patient have dental issues? No   Does patient have vision issues? No   Does your patient have an abnormal blood pressure today? Yes   Since previous encounter, have you referred patient for abnormal blood pressure that resulted in a new diagnosis or medication  change? Yes   Does your patient have an abnormal blood glucose today? No   Since previous encounter, have you referred patient for abnormal blood glucose that resulted in a new diagnosis or medication change? No   Was there a life-saving intervention made? Yes         Clinical Intake - 02/12/17 1436      Pre-visit preparation   Pre-visit preparation completed Yes     Abuse/Neglect   Do you feel unsafe in your current relationship? No   Do you feel physically threatened by others? No   Anyone hurting you at home, work, or school? No   Unable to ask? No     Patient Literacy   How often do you need to have someone help you when you read instructions, pamphlets, or other written materials from your doctor or pharmacy? 1 - Never   What is the last grade level you completed in school? 2 years of colleges      Web designer Needed? No    Client in for blood  Pressure check after his  PCP visit on yesterday . Nurse had picked up medications  Ordered by MD on yesterday and had questions about should he stay on other blood  pressure medications ? Client not sure ,[facility] to clarify medications in order to counsel client ,left a message to call Engineer, maintenance (IT) . Client will start taking his  hydrochlorothiazide and amlodipine  Besylate this pm . Client has an appointment to talk with pharmacy to get Eliquis  paid for by drug company  On 02-25-17 encouraged to keep .  Back in for repeat blood pressure check and took medications picked up @ 4;32 pm   165/102 , pulse 100 , again counseled regarding his blood pressure.

## 2017-02-14 LAB — COMPREHENSIVE METABOLIC PANEL
A/G RATIO: 1.9 (ref 1.2–2.2)
ALT: 16 IU/L (ref 0–44)
AST: 17 IU/L (ref 0–40)
Albumin: 4.3 g/dL (ref 3.5–5.5)
Alkaline Phosphatase: 74 IU/L (ref 39–117)
BILIRUBIN TOTAL: 0.5 mg/dL (ref 0.0–1.2)
BUN / CREAT RATIO: 10 (ref 9–20)
BUN: 8 mg/dL (ref 6–24)
CALCIUM: 9.5 mg/dL (ref 8.7–10.2)
CHLORIDE: 104 mmol/L (ref 96–106)
CO2: 22 mmol/L (ref 20–29)
Creatinine, Ser: 0.79 mg/dL (ref 0.76–1.27)
GFR, EST AFRICAN AMERICAN: 121 mL/min/{1.73_m2} (ref >59)
GFR, EST NON AFRICAN AMERICAN: 105 mL/min/{1.73_m2} (ref >59)
GLOBULIN, TOTAL: 2.3 g/dL (ref 1.5–4.5)
Glucose: 107 mg/dL — ABNORMAL HIGH (ref 65–99)
POTASSIUM: 3.9 mmol/L (ref 3.5–5.2)
SODIUM: 142 mmol/L (ref 134–144)
TOTAL PROTEIN: 6.6 g/dL (ref 6.0–8.5)

## 2017-02-14 LAB — LIPID PANEL
CHOL/HDL RATIO: 3.1 ratio (ref 0.0–5.0)
Cholesterol, Total: 158 mg/dL (ref 100–199)
HDL: 51 mg/dL (ref >39)
LDL CALC: 82 mg/dL (ref 0–99)
Triglycerides: 125 mg/dL (ref 0–149)
VLDL Cholesterol Cal: 25 mg/dL (ref 5–40)

## 2017-02-14 LAB — CBC WITH DIFFERENTIAL/PLATELET
BASOS: 0 %
Basophils Absolute: 0 10*3/uL (ref 0.0–0.2)
EOS (ABSOLUTE): 0.2 10*3/uL (ref 0.0–0.4)
EOS: 1 %
Hematocrit: 47.2 % (ref 37.5–51.0)
Hemoglobin: 15.2 g/dL (ref 13.0–17.7)
IMMATURE GRANS (ABS): 0 10*3/uL (ref 0.0–0.1)
IMMATURE GRANULOCYTES: 0 %
LYMPHS: 27 %
Lymphocytes Absolute: 3 10*3/uL (ref 0.7–3.1)
MCH: 30.4 pg (ref 26.6–33.0)
MCHC: 32.2 g/dL (ref 31.5–35.7)
MCV: 94 fL (ref 79–97)
MONOS ABS: 0.9 10*3/uL (ref 0.1–0.9)
Monocytes: 8 %
NEUTROS PCT: 64 %
Neutrophils Absolute: 7.1 10*3/uL — ABNORMAL HIGH (ref 1.4–7.0)
PLATELETS: 229 10*3/uL (ref 150–379)
RBC: 5 x10E6/uL (ref 4.14–5.80)
RDW: 14.4 % (ref 12.3–15.4)
WBC: 11.2 10*3/uL — ABNORMAL HIGH (ref 3.4–10.8)

## 2017-02-14 LAB — TSH: TSH: 1.03 u[IU]/mL (ref 0.450–4.500)

## 2017-02-20 ENCOUNTER — Ambulatory Visit (HOSPITAL_COMMUNITY)
Admission: RE | Admit: 2017-02-20 | Discharge: 2017-02-20 | Disposition: A | Discharge status: Home / Self Care | Payer: Self-pay | Source: Ambulatory Visit | Attending: Physician Assistant | Admitting: Physician Assistant

## 2017-02-20 ENCOUNTER — Telehealth (INDEPENDENT_AMBULATORY_CARE_PROVIDER_SITE_OTHER): Payer: Self-pay | Admitting: Physician Assistant

## 2017-02-20 DIAGNOSIS — E279 Disorder of adrenal gland, unspecified: Secondary | ICD-10-CM | POA: Insufficient documentation

## 2017-02-20 DIAGNOSIS — E278 Other specified disorders of adrenal gland: Secondary | ICD-10-CM

## 2017-02-20 NOTE — Telephone Encounter (Signed)
Refill request. Please advise.  

## 2017-02-20 NOTE — Telephone Encounter (Signed)
Pt called requesting a refill of  apixaban (ELIQUIS) 5 MG TABS tablet and Ibuprophem 800 mg  [facility] thank you

## 2017-02-20 NOTE — Congregational Nurse Program (Signed)
Congregational Nurse Program Note  Date of Encounter: 02/19/2017  Past Medical History: Past Medical History:  Diagnosis Date  . Anginal pain (HCC)   . Bipolar disorder (HCC)   . Depression   . ETOH abuse   . H/O cocaine abuse   . Hypercholesterolemia   . Hypertension   . PAF (paroxysmal atrial fibrillation) (HCC)    [name]/notes 12/21/2014  . Stroke [facility](HCC) 2004   pt sts about 12 years ago.   . Tobacco abuse   . Type II diabetes mellitus (HCC) dx'd 12/21/2014    Encounter Details:     CNP Questionnaire - 02/20/17 1655      Patient Demographics   Is this a new or existing patient? Existing   Patient is considered a/an Not Applicable   Race African-American/Black     Patient Assistance   Location of Patient Assistance Not Applicable   Patient's financial/insurance status Self-Pay (Uninsured)   Uninsured Patient ([place] Card/Care Connects) Yes   Interventions Follow-up/Education/Support provided after completed appt.   Patient referred to apply for the following financial assistance [employer]/Care Connects   Food insecurities addressed Not Applicable   Transportation assistance No   Assistance securing medications No   Educational health offerings Medications;Chronic disease;Hypertension     Encounter Details   Primary purpose of visit Education/Health Concerns;Spiritual Care/Support Visit;Chronic Illness/Condition Visit;Navigating the Healthcare System   Was an Emergency Department visit averted? Not Applicable   Does patient have a medical provider? Yes   Patient referred to Clinic;Follow up with established PCP   Was a mental health screening completed? (GAINS tool) No   Does patient have dental issues? No   Does patient have vision issues? No   Does your patient have an abnormal blood pressure today? Yes   Since previous encounter, have you referred patient for abnormal blood pressure that resulted in a new diagnosis or medication change? No   Does your patient have an  abnormal blood glucose today? No   Since previous encounter, have you referred patient for abnormal blood glucose that resulted in a new diagnosis or medication change? No   Was there a life-saving intervention made? No         Clinical Intake - 02/12/17 1436      Pre-visit preparation   Pre-visit preparation completed Yes     Abuse/Neglect   Do you feel unsafe in your current relationship? No   Do you feel physically threatened by others? No   Anyone hurting you at home, work, or school? No   Unable to ask? No     Patient Literacy   How often do you need to have someone help you when you read instructions, pamphlets, or other written materials from your doctor or pharmacy? 1 - Never   What is the last grade level you completed in school? 2 years of colleges      Web designerLanguage Assistant   Interpreter Needed? No    Client in today has been taking his medications he states ,reminded of appointments for tomorrow to get renal scan and see pharmacist. Working at [employer] from 8-12 noon ,looking for another job! Willing to stop smoking given quit number and [employer] class schedule. Supportive

## 2017-02-20 NOTE — Congregational Nurse Program (Signed)
Congregational Nurse Program Note  Date of Encounter: 02/20/2017  Past Medical History: Past Medical History:  Diagnosis Date  . Anginal pain (HCC)   . Bipolar disorder (HCC)   . Depression   . ETOH abuse   . H/O cocaine abuse   . Hypercholesterolemia   . Hypertension   . PAF (paroxysmal atrial fibrillation) (HCC)    Gary Buck 12/21/2014  . Stroke [facility]) 2004   pt sts about 12 years ago.   . Tobacco abuse   . Type II diabetes mellitus (HCC) dx'd 12/21/2014    Encounter Details:     CNP Questionnaire - 02/20/17 1701      Patient Demographics   Is this a new or existing patient? Existing   Patient is considered a/an Not Applicable   Race African-American/Black     Patient Assistance   Location of Patient Assistance Not Applicable   Patient's financial/insurance status Self-Pay (Uninsured)   Uninsured Patient ([place] Card/Care Connects) Yes   Interventions Follow-up/Education/Support provided after completed appt.   Patient referred to apply for the following financial assistance [employer] insecurities addressed Not Applicable   Transportation assistance Yes   Type of Assistance [employer] Given   Assistance securing medications Yes   Type of Assistance Other   Educational health offerings Hypertension;Medications;Chronic disease     Encounter Details   Primary purpose of visit Spiritual Care/Support Visit;Education/Health Concerns;Navigating the Healthcare System   Was an Emergency Department visit averted? Not Applicable   Does patient have a medical provider? Yes   Patient referred to Clinic;Follow up with established PCP   Was a mental health screening completed? (GAINS tool) No   Does patient have dental issues? No   Does patient have vision issues? No   Does your patient have an abnormal blood pressure today? No   Since previous encounter, have you referred patient for abnormal blood pressure that resulted in a new diagnosis or medication change?  No   Does your patient have an abnormal blood glucose today? No   Since previous encounter, have you referred patient for abnormal blood glucose that resulted in a new diagnosis or medication change? No   Was there a life-saving intervention made? No         Clinical Intake - 02/12/17 1436      Pre-visit preparation   Pre-visit preparation completed Yes     Abuse/Neglect   Do you feel unsafe in your current relationship? No   Do you feel physically threatened by others? No   Anyone hurting you at home, work, or school? No   Unable to ask? No     Patient Literacy   How often do you need to have someone help you when you read instructions, pamphlets, or other written materials from your doctor or pharmacy? 1 - Never   What is the last grade level you completed in school? 2 years of colleges      Web designer Needed? No    Client in after his renal scan ,states MD needs to call in his other medications Eliquis and ibuprofen if he he to get it ,client present called the [facility] to have them speak with his MD regarding the above medications as he was on them and pharmacy will need new orders to fill . Left message for nurse to call client. Has his disability check up tomorrow also wanted a copy of his lab work . Bus passes given for next week's  appointment to return to see MD.   Follow

## 2017-02-20 NOTE — Telephone Encounter (Signed)
Patient  Wants to talk with a nurse regarding his medications Thank you

## 2017-02-21 ENCOUNTER — Other Ambulatory Visit (INDEPENDENT_AMBULATORY_CARE_PROVIDER_SITE_OTHER): Payer: Self-pay | Admitting: Physician Assistant

## 2017-02-21 DIAGNOSIS — Z760 Encounter for issue of repeat prescription: Secondary | ICD-10-CM

## 2017-02-21 MED ORDER — APIXABAN 5 MG PO TABS: 5.0000 mg | ORAL_TABLET | Freq: Two times a day (BID) | ORAL | 0 refills | Status: DC

## 2017-02-21 MED ORDER — APIXABAN 5 MG PO TABS: 5.0000 mg | ORAL_TABLET | Freq: Two times a day (BID) | ORAL | 11 refills | Status: DC

## 2017-02-21 NOTE — Telephone Encounter (Signed)
I will send refill for apixaban. Patient should not be taking Ibuprofen anymore as this can increase his risk of bleeding.

## 2017-02-21 NOTE — Telephone Encounter (Signed)
Medical Assistant left message on patient's home and cell voicemail. Voicemail states to give a call back to Nubia with [facility] at [phone].  

## 2017-02-22 NOTE — Telephone Encounter (Signed)
Patient is aware of Eliquis being refilled and informed of ibuprofen causing increased bleeding and the patient should not be taking. VM left on patients home/cell phone. Patient may reach the office at [phone] for any concerns.

## 2017-02-25 ENCOUNTER — Ambulatory Visit: Payer: Self-pay | Attending: Physician Assistant

## 2017-02-25 MED FILL — !ELIQUIS 5 MG TABLET: 5 | 30 days supply | Qty: 60 | Fill #0

## 2017-02-26 ENCOUNTER — Emergency Department (HOSPITAL_COMMUNITY): Payer: Self-pay

## 2017-02-26 ENCOUNTER — Emergency Department (HOSPITAL_COMMUNITY)
Admission: EM | Admit: 2017-02-26 | Discharge: 2017-02-26 | Disposition: A | Discharge status: Home / Self Care | Payer: Self-pay | Attending: Emergency Medicine | Admitting: Emergency Medicine

## 2017-02-26 ENCOUNTER — Encounter (HOSPITAL_COMMUNITY): Payer: Self-pay | Admitting: *Deleted

## 2017-02-26 DIAGNOSIS — Z8673 Personal history of transient ischemic attack (TIA), and cerebral infarction without residual deficits: Secondary | ICD-10-CM | POA: Insufficient documentation

## 2017-02-26 DIAGNOSIS — Z7901 Long term (current) use of anticoagulants: Secondary | ICD-10-CM | POA: Insufficient documentation

## 2017-02-26 DIAGNOSIS — F17210 Nicotine dependence, cigarettes, uncomplicated: Secondary | ICD-10-CM | POA: Insufficient documentation

## 2017-02-26 DIAGNOSIS — E7800 Pure hypercholesterolemia, unspecified: Secondary | ICD-10-CM | POA: Insufficient documentation

## 2017-02-26 DIAGNOSIS — R079 Chest pain, unspecified: Secondary | ICD-10-CM | POA: Insufficient documentation

## 2017-02-26 DIAGNOSIS — I10 Essential (primary) hypertension: Secondary | ICD-10-CM | POA: Insufficient documentation

## 2017-02-26 DIAGNOSIS — I2510 Atherosclerotic heart disease of native coronary artery without angina pectoris: Secondary | ICD-10-CM | POA: Insufficient documentation

## 2017-02-26 DIAGNOSIS — E119 Type 2 diabetes mellitus without complications: Secondary | ICD-10-CM | POA: Insufficient documentation

## 2017-02-26 DIAGNOSIS — I480 Paroxysmal atrial fibrillation: Secondary | ICD-10-CM | POA: Insufficient documentation

## 2017-02-26 DIAGNOSIS — Z79899 Other long term (current) drug therapy: Secondary | ICD-10-CM | POA: Insufficient documentation

## 2017-02-26 LAB — CBC
HCT: 48.1 % (ref 39.0–52.0)
Hemoglobin: 15.3 g/dL (ref 13.0–17.0)
MCH: 29.2 pg (ref 26.0–34.0)
MCHC: 31.8 g/dL (ref 30.0–36.0)
MCV: 91.8 fL (ref 78.0–100.0)
Platelets: 265 10*3/uL (ref 150–400)
RBC: 5.24 MIL/uL (ref 4.22–5.81)
RDW: 13.5 % (ref 11.5–15.5)
WBC: 13.1 10*3/uL — ABNORMAL HIGH (ref 4.0–10.5)

## 2017-02-26 LAB — BASIC METABOLIC PANEL
Anion gap: 10 (ref 5–15)
BUN: 19 mg/dL (ref 6–20)
CO2: 28 mmol/L (ref 22–32)
Calcium: 9.9 mg/dL (ref 8.9–10.3)
Chloride: 101 mmol/L (ref 101–111)
Creatinine, Ser: 1.16 mg/dL (ref 0.61–1.24)
GFR calc Af Amer: >60 mL/min (ref >60)
GFR calc non Af Amer: >60 mL/min (ref >60)
Glucose, Bld: 116 mg/dL — ABNORMAL HIGH (ref 65–99)
Potassium: 3.1 mmol/L — ABNORMAL LOW (ref 3.5–5.1)
Sodium: 139 mmol/L (ref 135–145)

## 2017-02-26 LAB — I-STAT TROPONIN, ED: Troponin i, poc: 0 ng/mL (ref 0.00–0.08)

## 2017-02-26 LAB — TROPONIN I: Troponin I: <0.03 ng/mL (ref <0.03)

## 2017-02-26 MED ORDER — POTASSIUM CHLORIDE CRYS ER 20 MEQ PO TBCR
40.0000 meq | EXTENDED_RELEASE_TABLET | Freq: Once | ORAL | Status: AC
  Administered 2017-02-26: 40 meq via ORAL
  Filled 2017-02-26: qty 2

## 2017-02-26 MED ORDER — NITROGLYCERIN 0.4 MG SL SUBL
0.4000 mg | SUBLINGUAL_TABLET | SUBLINGUAL | Status: DC | PRN
  Administered 2017-02-26: 0.4 mg via SUBLINGUAL
  Filled 2017-02-26: qty 1

## 2017-02-26 MED ORDER — MORPHINE SULFATE (PF) 4 MG/ML IV SOLN
4.0000 mg | Freq: Once | INTRAVENOUS | Status: AC
  Administered 2017-02-26: 4 mg via INTRAVENOUS
  Filled 2017-02-26: qty 1

## 2017-02-26 NOTE — ED Notes (Signed)
Pt given NTG 1 SL for chest pain rate of 6 on pain scale 0/10

## 2017-02-26 NOTE — ED Triage Notes (Signed)
C/o chest pain while sitting  With no radiation, c/o diaphoresis no n/v. States he has been off his eliquis for 3 months started taking yest.

## 2017-02-26 NOTE — ED Notes (Signed)
Pt to xray at this time.

## 2017-02-26 NOTE — ED Notes (Signed)
Pt given 2 Malaw[place]urkey sandwiches and sprite to drink

## 2017-02-26 NOTE — ED Notes (Signed)
Got patient undress on the monitor patient is resting 

## 2017-02-26 NOTE — ED Notes (Signed)
Pt requesting something to eat.  

## 2017-02-26 NOTE — ED Notes (Signed)
Pt requesting more pain meds

## 2017-02-26 NOTE — ED Provider Notes (Signed)
[facility]-EMERGENCY DEPT Provider Note   CSN: [id number] Arrival date & time: 02/26/17  1423     History   Chief Complaint Chief Complaint  Patient presents with  . Chest Pain    HPI Gary Buck is a 51 y.o. male.  HPI   51yM with CP. Onset this afternoon. Center his chest. Feels sharp. He was at rest when symptoms began. Symptoms have persisted since then. Does not radiate. He felt sweaty at that time though. No nausea or vomiting. No fevers or chills. No unusual leg pain or swelling.  Past Medical History:  Diagnosis Date  . Anginal pain (HCC)   . Bipolar disorder (HCC)   . Depression   . ETOH abuse   . H/O cocaine abuse   . Hypercholesterolemia   . Hypertension   . PAF (paroxysmal atrial fibrillation) (HCC)    [name] 12/21/2014  . Stroke [facility]) 2004   pt sts about 12 years ago.   . Tobacco abuse   . Type II diabetes mellitus (HCC) dx'd 12/21/2014    Patient Active Problem List   Diagnosis Date Noted  . Uncontrolled hypertension 11/18/2016  . Obesity (BMI 30-39.9) 11/18/2016  . PAF (paroxysmal atrial fibrillation) (HCC) 11/17/2016  . Hypercholesterolemia 11/17/2016  . Type II diabetes mellitus (HCC) 11/17/2016  . Bipolar disorder (HCC) 11/17/2016  . ETOH abuse 11/17/2016  . Tobacco abuse 11/17/2016  . Bipolar affective disorder, mixed, severe (HCC) 06/28/2016  . Bipolar affective disorder, current episode mixed, without psychotic features (HCC) 06/28/2016  . Atrial fibrillation, unspecified   . Elevated troponin   . Atrial fibrillation with RVR (HCC) 12/21/2014  . CAD (coronary artery disease) 12/21/2014  . Chest pain   . Essential hypertension   . Remote history of stroke   . Polysubstance abuse     Past Surgical History:  Procedure Laterality Date  . CYSTECTOMY  ~ 2004   "on the top of my forehead"  . INGUINAL HERNIA REPAIR Left ~ 2012       Home Medications    Prior to Admission medications   Medication Sig Start Date End Date Taking?  Authorizing Provider  amLODipine (NORVASC) 10 MG tablet Take 1 tablet (10 mg total) by mouth daily. 02/12/17  Yes Loletta Specter, PA-C  apixaban (ELIQUIS) 5 MG TABS tablet Take 1 tablet (5 mg total) by mouth 2 (two) times daily. 02/21/17  Yes Loletta Specter, PA-C  hydrochlorothiazide (HYDRODIURIL) 25 MG tablet Take 1 tablet (25 mg total) by mouth daily. Take on tablet in the morning. 02/12/17  Yes Loletta Specter, PA-C  lisinopril (PRINIVIL,ZESTRIL) 40 MG tablet Take 1 tablet (40 mg total) by mouth daily. 11/18/16  Yes Dhungel, Nishant, MD  metoprolol tartrate (LOPRESSOR) 25 MG tablet Take 1 tablet (25 mg total) by mouth 2 (two) times daily. 11/18/16  Yes Dhungel, Nishant, MD  risperiDONE (RISPERDAL) 1 MG tablet Take 1 mg by mouth 2 (two) times daily. 11/26/16  Yes [provider]  TRILEPTAL 300 MG tablet Take 300 mg by mouth 2 (two) times daily. 11/26/16  Yes [provider]  nicotine (NICODERM CQ - DOSED IN MG/24 HOURS) 21 mg/24hr patch Place 1 patch (21 mg total) onto the skin daily. Patient not taking: Reported on 02/26/2017 11/18/16   Eddie North, MD    Family History Family History  Problem Relation Age of Onset  . Diabetes Mellitus II Mother     Social History Social History  Substance Use Topics  . Smoking status: Current Every  Day Smoker    Packs/day: 0.50    Years: 32.00    Types: Cigarettes  . Smokeless tobacco: Never Used  . Alcohol use 32.4 oz/week    54 Cans of beer per week     Comment: 12/21/2014 "2-3, 24oz  beers per night during work nights, 6 24oz beer per day on the weekends"     Allergies   Patient has no known allergies.   Review of Systems Review of Systems  All systems reviewed and negative, other than as noted in HPI.  Physical Exam Updated Vital Signs BP (!) 165/86   Pulse 79   Temp 97.6 F (36.4 C) (Oral)   Resp 18   Ht 6' 5.5" (1.969 m)   Wt (!) 144.7 kg (319 lb)   SpO2 99%   BMI 37.34 kg/m   Physical Exam    Constitutional: He appears well-developed and well-nourished. No distress.  HENT:  Head: Normocephalic and atraumatic.  Eyes: Conjunctivae are normal. Right eye exhibits no discharge. Left eye exhibits no discharge.  Neck: Neck supple.  Cardiovascular: Normal rate, regular rhythm and normal heart sounds.  Exam reveals no gallop and no friction rub.   No murmur heard. Pulmonary/Chest: Effort normal and breath sounds normal. No respiratory distress.  Abdominal: Soft. He exhibits no distension. There is no tenderness.  Musculoskeletal: He exhibits no edema or tenderness.  Lower extremities symmetric as compared to each other. No calf tenderness. Negative Homan's. No palpable cords.   Neurological: He is alert.  Skin: Skin is warm and dry.  Psychiatric: He has a normal mood and affect. His behavior is normal. Thought content normal.  Nursing note and vitals reviewed.    ED Treatments / Results  Labs (all labs ordered are listed, but only abnormal results are displayed) Labs Reviewed  BASIC METABOLIC PANEL - Abnormal; Notable for the following:       Result Value   Potassium 3.1 (*)    Glucose, Bld 116 (*)    All other components within normal limits  CBC - Abnormal; Notable for the following:    WBC 13.1 (*)    All other components within normal limits  TROPONIN I  I-STAT TROPONIN, ED    EKG  EKG Interpretation  Date/Time:  Tuesday February 26 2017 14:29:13 EDT Ventricular Rate:  87 PR Interval:    QRS Duration: 104 QT Interval:  371 QTC Calculation: 447 R Axis:   -17 Text Interpretation:  Sinus rhythm Probable left atrial enlargement Left ventricular hypertrophy Anterior infarct, acute (LAD) Lateral leads are also involved No significant change since last tracing Confirmed by Doug Sou [phone]) on 02/26/2017 3:03:11 PM       Radiology Dg Chest 2 View  Result Date: 02/26/2017 CLINICAL DATA:  Chest pain. EXAM: CHEST  2 VIEW COMPARISON:  November 17, 2016. FINDINGS:  Mild cardiomegaly, similar to prior study. Normal pulmonary vascularity. No focal consolidation, pleural effusion, or pneumothorax. No acute osseous abnormality. IMPRESSION: Mild cardiomegaly.  No active cardiopulmonary disease. Electronically Signed   By: Obie Dredge M.D.   On: 02/26/2017 15:59    Procedures Procedures (including critical care time)  Medications Ordered in ED Medications  nitroGLYCERIN (NITROSTAT) SL tablet 0.4 mg (0.4 mg Sublingual Given 02/26/17 1828)  morphine 4 MG/ML injection 4 mg (4 mg Intravenous Given 02/26/17 1533)  potassium chloride SA (K-DUR,KLOR-CON) CR tablet 40 mEq (40 mEq Oral Given 02/26/17 1624)     Initial Impression / Assessment and Plan / ED Course  I  have reviewed the triage vital signs and the nursing notes.  Pertinent labs & imaging results that were available during my care of the patient were reviewed by me and considered in my medical decision making (see chart for details).    Final Clinical Impressions(s) / ED Diagnoses   Final diagnoses:  Chest pain, unspecified type    New Prescriptions New Prescriptions   No medications on file     Raeford RazorKohut, Jackolyn Geron, MD 03/15/17 1303

## 2017-02-27 NOTE — Congregational Nurse Program (Signed)
Congregational Nurse Program Note  Date of Encounter: 01/22/2017  Past Medical History: Past Medical History:  Diagnosis Date  . Anginal pain (HCC)   . Bipolar disorder (HCC)   . Depression   . ETOH abuse   . H/O cocaine abuse   . Hypercholesterolemia   . Hypertension   . PAF (paroxysmal atrial fibrillation) (HCC)    [name]/notes 12/21/2014  . Stroke [facility](HCC) 2004   pt sts about 12 years ago.   . Tobacco abuse   . Type II diabetes mellitus (HCC) dx'd 12/21/2014    Encounter Details:     CNP Questionnaire - 02/12/17 1832      Patient Demographics   Is this a new or existing patient? Existing   Patient is considered a/an Not Applicable   Race African-American/Black     Patient Assistance   Location of Patient Assistance Not Applicable   Patient's financial/insurance status [employer] officer[employer]Card/Care Connects;Self-Pay (Uninsured)   Uninsured Patient ([place] Card/Care Connects) Yes   Interventions Appt. has been completed;Follow-up/Education/Support provided after completed appt.   Patient referred to apply for the following financial assistance [employer]/Care Connects   Food insecurities addressed Not Applicable   Transportation assistance Yes   Type of Assistance [employer] Given   Assistance securing medications No   Educational health offerings Chronic disease;Medications;Hypertension;Navigating the healthcare system     Encounter Details   Primary purpose of visit Spiritual Care/Support Visit;Education/Health Concerns;Chronic Illness/Condition Visit;Post PCP Visit   Was an Emergency Department visit averted? Not Applicable   Does patient have a medical provider? Yes   Patient referred to Clinic;Area Agency;Follow up with established PCP   Was a mental health screening completed? (GAINS tool) No   Does patient have dental issues? No   Does patient have vision issues? No   Does your patient have an abnormal blood pressure today? No   Since previous encounter, have you referred patient  for abnormal blood pressure that resulted in a new diagnosis or medication change? No   Does your patient have an abnormal blood glucose today? No   Since previous encounter, have you referred patient for abnormal blood glucose that resulted in a new diagnosis or medication change? No   Was there a life-saving intervention made? No         Clinical Intake - 02/12/17 1436      Pre-visit preparation   Pre-visit preparation completed Yes     Abuse/Neglect   Do you feel unsafe in your current relationship? No   Do you feel physically threatened by others? No   Anyone hurting you at home, work, or school? No   Unable to ask? No     Patient Literacy   How often do you need to have someone help you when you read instructions, pamphlets, or other written materials from your doctor or pharmacy? 1 - Never   What is the last grade level you completed in school? 2 years of colleges      Web designerLanguage Assistant   Interpreter Needed? No    Client's Bp continues very high. Client states that he doesn't have his [facility] but [facility] Client is aware of how to contact [facility] for refills. CN [facility]. Client states that he would like to have a different provider. CN also advised going to CN clinic at current shelter for increased continuity of care.

## 2017-02-27 NOTE — Congregational Nurse Program (Signed)
Congregational Nurse Program Note  Date of Encounter: 02/27/2017  Past Medical History: Past Medical History:  Diagnosis Date  . Anginal pain (HCC)   . Bipolar disorder (HCC)   . Depression   . ETOH abuse   . H/O cocaine abuse   . Hypercholesterolemia   . Hypertension   . PAF (paroxysmal atrial fibrillation) (HCC)    [name]/notes 12/21/2014  . Stroke [facility](HCC) 2004   pt sts about 12 years ago.   . Tobacco abuse   . Type II diabetes mellitus (HCC) dx'd 12/21/2014    Encounter Details:     CNP Questionnaire - 02/27/17 1653      Patient Demographics   Is this a new or existing patient? Existing   Patient is considered a/an Not Applicable   Race African-American/Black     Patient Assistance   Location of Patient Assistance Not Applicable   Patient's financial/insurance status Self-Pay (Uninsured)   Uninsured Patient ([place] Card/Care Connects) Yes   Interventions Follow-up/Education/Support provided after completed appt.;Counseled to make appt. with provider   Patient referred to apply for the following financial assistance Medicaid   Food insecurities addressed Not Applicable   Transportation assistance No   Assistance securing medications No   Educational health offerings Hypertension;Medications;Chronic disease;Cardiac disease     Encounter Details   Primary purpose of visit Education/Health Concerns;Spiritual Care/Support Visit;Post ED/Hospitalization Visit   Was an Emergency Department visit averted? Not Applicable   Does patient have a medical provider? Yes   Patient referred to Clinic;Follow up with established PCP   Was a mental health screening completed? (GAINS tool) No   Does patient have dental issues? No   Does patient have vision issues? No   Does your patient have an abnormal blood pressure today? Yes   Since previous encounter, have you referred patient for abnormal blood pressure that resulted in a new diagnosis or medication change? No   Does your patient have an  abnormal blood glucose today? No   Since previous encounter, have you referred patient for abnormal blood glucose that resulted in a new diagnosis or medication change? No   Was there a life-saving intervention made? No         Clinical Intake - 02/12/17 1436      Pre-visit preparation   Pre-visit preparation completed Yes     Abuse/Neglect   Do you feel unsafe in your current relationship? No   Do you feel physically threatened by others? No   Anyone hurting you at home, work, or school? No   Unable to ask? No     Patient Literacy   How often do you need to have someone help you when you read instructions, pamphlets, or other written materials from your doctor or pharmacy? 1 - Never   What is the last grade level you completed in school? 2 years of colleges      Web designerLanguage Assistant   Interpreter Needed? No    Client in after being seen in ED on yesterday for chest pains ,states still having some discomfort ,no radiation down arms ,no SOB or other symptoms noted  Today but  Client still states he feels something, states he has a referral to  Cardiologist on 9-17 but  A review of chart did not see a referral ? Not sure ??  Chart review no change in EKG and client states that is what he was told when ask if he had one done . Marland Kitchen. Call to DSS to check on transportation  referral states application not  Received ,refaxed application today . Counseled regarding chest pains if they return what to do . Client understands.

## 2017-02-28 NOTE — Congregational Nurse Program (Signed)
Congregational Nurse Program Note  Date of Encounter: 01/25/2017  Past Medical History: Past Medical History:  Diagnosis Date  . Anginal pain (HCC)   . Bipolar disorder (HCC)   . Depression   . ETOH abuse   . H/O cocaine abuse   . Hypercholesterolemia   . Hypertension   . PAF (paroxysmal atrial fibrillation) (HCC)    [name]/notes 12/21/2014  . Stroke [facility](HCC) 2004   pt sts about 12 years ago.   . Tobacco abuse   . Type II diabetes mellitus (HCC) dx'd 12/21/2014    Encounter Details:     CNP Questionnaire - 02/13/17 1756      Patient Demographics   Patient is considered a/an Not Applicable         Clinical Intake - 02/12/17 1436      Pre-visit preparation   Pre-visit preparation completed Yes     Abuse/Neglect   Do you feel unsafe in your current relationship? No   Do you feel physically threatened by others? No   Anyone hurting you at home, work, or school? No   Unable to ask? No     Patient Literacy   How often do you need to have someone help you when you read instructions, pamphlets, or other written materials from your doctor or pharmacy? 1 - Never   What is the last grade level you completed in school? 2 years of colleges      Web designerLanguage Assistant   Interpreter Needed? No    Notified client that he will have to go to [facility]

## 2017-03-03 NOTE — Congregational Nurse Program (Signed)
Congregational Nurse Program Note  Date of Encounter: 02/08/2017  Past Medical History: Past Medical History:  Diagnosis Date  . Anginal pain (HCC)   . Bipolar disorder (HCC)   . Depression   . ETOH abuse   . H/O cocaine abuse   . Hypercholesterolemia   . Hypertension   . PAF (paroxysmal atrial fibrillation) (HCC)    Gary Buck 12/21/2014  . Stroke [facility]) 2004   pt sts about 12 years ago.   . Tobacco abuse   . Type II diabetes mellitus (HCC) dx'd 12/21/2014    Encounter Details:     CNP Questionnaire - 02/27/17 1653      Patient Demographics   Is this a new or existing patient? Existing   Patient is considered a/an Not Applicable   Race African-American/Black     Patient Assistance   Location of Patient Assistance Not Applicable   Patient's financial/insurance status Self-Pay (Uninsured)   Uninsured Patient ([place] Card/Care Connects) Yes   Interventions Follow-up/Education/Support provided after completed appt.;Counseled to make appt. with provider   Patient referred to apply for the following financial assistance Medicaid   Food insecurities addressed Not Applicable   Transportation assistance No   Assistance securing medications No   Educational health offerings Hypertension;Medications;Chronic disease;Cardiac disease     Encounter Details   Primary purpose of visit Education/Health Concerns;Spiritual Care/Support Visit;Post ED/Hospitalization Visit   Was an Emergency Department visit averted? Not Applicable   Does patient have a medical provider? Yes   Patient referred to Clinic;Follow up with established PCP   Was a mental health screening completed? (GAINS tool) No   Does patient have dental issues? No   Does patient have vision issues? No   Does your patient have an abnormal blood pressure today? Yes   Since previous encounter, have you referred patient for abnormal blood pressure that resulted in a new diagnosis or medication change? No   Does your patient have an  abnormal blood glucose today? No   Since previous encounter, have you referred patient for abnormal blood glucose that resulted in a new diagnosis or medication change? No   Was there a life-saving intervention made? No         Clinical Intake - 02/12/17 1436      Pre-visit preparation   Pre-visit preparation completed Yes     Abuse/Neglect   Do you feel unsafe in your current relationship? No   Do you feel physically threatened by others? No   Anyone hurting you at home, work, or school? No   Unable to ask? No     Patient Literacy   How often do you need to have someone help you when you read instructions, pamphlets, or other written materials from your doctor or pharmacy? 1 - Never   What is the last grade level you completed in school? 2 years of colleges      Web designer Needed? No    BP check. Client states he is taking medication. Client has upcoming appointment at [facility].

## 2017-03-04 ENCOUNTER — Encounter: Payer: Self-pay | Admitting: Pediatric Intensive Care

## 2017-03-05 NOTE — Congregational Nurse Program (Signed)
Congregational Nurse Program Note  Date of Encounter: 03/05/2017  Past Medical History: Past Medical History:  Diagnosis Date  . Anginal pain (HCC)   . Bipolar disorder (HCC)   . Depression   . ETOH abuse   . H/O cocaine abuse   . Hypercholesterolemia   . Hypertension   . PAF (paroxysmal atrial fibrillation) (HCC)    [name] 12/21/2014  . Stroke [facility]) 2004   pt sts about 12 years ago.   . Tobacco abuse   . Type II diabetes mellitus (HCC) dx'd 12/21/2014    Encounter Details:     CNP Questionnaire - 03/05/17 1902      Patient Demographics   Is this a new or existing patient? Existing   Patient is considered a/an Not Applicable   Race African-American/Black     Patient Assistance   Location of Patient Assistance Not Applicable   Patient's financial/insurance status Self-Pay (Uninsured)   Uninsured Patient ([place] Card/Care Connects) Yes   Interventions Counseled to make appt. with provider;Appt. has been completed   Patient referred to apply for the following financial assistance [employer] insecurities addressed Not Technical brewer Yes   Type of Assistance [employer] Given   Assistance securing medications Yes   Type of Assistance Other   Educational health offerings Navigating the healthcare system;Chronic disease;Hypertension     Encounter Details   Primary purpose of visit Spiritual Care/Support Visit;Education/Health Concerns;Chronic Illness/Condition Visit   Was an Emergency Department visit averted? Not Applicable   Does patient have a medical provider? Yes   Patient referred to Clinic;Follow up with established PCP   Was a mental health screening completed? (GAINS tool) No   Does patient have dental issues? No   Does patient have vision issues? No   Does your patient have an abnormal blood pressure today? Yes   Since previous encounter, have you referred patient for abnormal blood pressure that resulted in a new  diagnosis or medication change? No   Does your patient have an abnormal blood glucose today? No   Since previous encounter, have you referred patient for abnormal blood glucose that resulted in a new diagnosis or medication change? No   Was there a life-saving intervention made? No         Clinical Intake - 02/12/17 1436      Pre-visit preparation   Pre-visit preparation completed Yes     Abuse/Neglect   Do you feel unsafe in your current relationship? No   Do you feel physically threatened by others? No   Anyone hurting you at home, work, or school? No   Unable to ask? No     Patient Literacy   How often do you need to have someone help you when you read instructions, pamphlets, or other written materials from your doctor or pharmacy? 1 - Never   What is the last grade level you completed in school? 2 years of colleges      Web designer Needed? No    Blood pressure really good today ,hasnt smoke recently he states waited until blood pressure was taken . Has MD appointment 03-12-17 for follow up at  [facility] , bus passes given for that appointment . Will need to check on his transportation application . Marland Kitchen In good spirits ,taking medications but states ran out of his Lisinopril  Yesterday ,counseled regarding the need to stay on his medications as  His  blood  Pressure  is finally down . Nurse will check with pharmacy tomorrow to see if she can pick up refills

## 2017-03-11 ENCOUNTER — Encounter: Payer: Self-pay | Admitting: Pediatric Intensive Care

## 2017-03-11 NOTE — Congregational Nurse Program (Signed)
Congregational Nurse Program Note  Date of Encounter: 03/06/2017  Past Medical History: Past Medical History:  Diagnosis Date  . Anginal pain (HCC)   . Bipolar disorder (HCC)   . Depression   . ETOH abuse   . H/O cocaine abuse   . Hypercholesterolemia   . Hypertension   . PAF (paroxysmal atrial fibrillation) (HCC)    [name] 12/21/2014  . Stroke [facility]) 2004   pt sts about 12 years ago.   . Tobacco abuse   . Type II diabetes mellitus (HCC) dx'd 12/21/2014    Encounter Details:     CNP Questionnaire - 03/11/17 1553      Patient Demographics   Is this a new or existing patient? Existing   Patient is considered a/an Not Applicable   Race African-American/Black     Patient Assistance   Location of Patient Assistance Not Applicable   Patient's financial/insurance status Self-Pay (Uninsured)   Uninsured Patient ([place] Card/Care Connects) Yes   Interventions Appt. has been completed;Follow-up/Education/Support provided after completed appt.   Patient referred to apply for the following financial assistance [employer] insecurities addressed Not Applicable   Transportation assistance Yes   Type of Assistance [employer] Given   Assistance securing medications Yes   Type of Assistance Other   Educational health offerings Hypertension;Navigating the healthcare system;Medications     Encounter Details   Primary purpose of visit Education/Health Concerns;Spiritual Care/Support Visit   Was an Emergency Department visit averted? Not Applicable   Does patient have a medical provider? Yes   Patient referred to Follow up with established PCP   Was a mental health screening completed? (GAINS tool) No   Does patient have dental issues? No   Does patient have vision issues? No   Does your patient have an abnormal blood pressure today? No   Since previous encounter, have you referred patient for abnormal blood pressure that resulted in a new diagnosis or  medication change? No   Does your patient have an abnormal blood glucose today? No   Since previous encounter, have you referred patient for abnormal blood glucose that resulted in a new diagnosis or medication change? No   Was there a life-saving intervention made? No    Client in to have blood pressure checked ,blood pressure really down drastically . Ask if he felt ok ,yes. Took blood pressure second time  15 minutes later blood pressure was 141/87 pulse 94 . That seems more like his blood  Pressure ask if he had recently smoked stated no ,nurse had been to [facility] pharmacy to pick up medications as he was out of Lisinopril . His new  PCP has not ordered this medication ,client has an appointment at  [facility] clinic on 9-25 18 will need to talk to  MD about his blood  Pressure medications and get new scripts if he wants him to continue on this medication .Client understands  Monitor weekly ,bus  Pass for next week PCP appointment given

## 2017-03-11 NOTE — Congregational Nurse Program (Signed)
Congregational Nurse Program Note  Date of Encounter: 03/11/2017  Past Medical History: Past Medical History:  Diagnosis Date  . Anginal pain (HCC)   . Bipolar disorder (HCC)   . Depression   . ETOH abuse   . H/O cocaine abuse   . Hypercholesterolemia   . Hypertension   . PAF (paroxysmal atrial fibrillation) (HCC)    [name] 12/21/2014  . Stroke [facility]) 2004   pt sts about 12 years ago.   . Tobacco abuse   . Type II diabetes mellitus (HCC) dx'd 12/21/2014    Encounter Details:     CNP Questionnaire - 03/11/17 0930      Patient Demographics   Is this a new or existing patient? Existing   Patient is considered a/an Not Applicable   Race African-American/Black     Patient Assistance   Location of Patient Assistance [facility]   Patient's financial/insurance status Self-Pay (Uninsured)   Uninsured Patient ([employer] Research officer, trade union) Yes   Interventions Not Applicable   Patient referred to apply for the following financial assistance [employer] insecurities addressed Not Technical brewer Yes   Type of Assistance [employer] Given   Assistance securing medications No   Type of Pharmacist, hospital the healthcare system;Chronic disease     Encounter Details   Primary purpose of visit Education/Health Concerns;Navigating the Healthcare System;Chronic Illness/Condition Visit   Was an Emergency Department visit averted? Not Applicable   Does patient have a medical provider? Yes   Patient referred to Follow up with established PCP   Was a mental health screening completed? (GAINS tool) No   Does patient have dental issues? No   Does patient have vision issues? No   Does your patient have an abnormal blood pressure today? Yes   Since previous encounter, have you referred patient for abnormal blood pressure that resulted in a new diagnosis or medication change? No   Does your patient have an abnormal blood  glucose today? No   Since previous encounter, have you referred patient for abnormal blood glucose that resulted in a new diagnosis or medication change? No   Was there a life-saving intervention made? No    BP check- client states he needs bus passes for appointment tomorrow. CN encouraged client to get bus passes at primary housing site.

## 2017-03-12 ENCOUNTER — Encounter (INDEPENDENT_AMBULATORY_CARE_PROVIDER_SITE_OTHER): Payer: Self-pay | Admitting: Physician Assistant

## 2017-03-12 ENCOUNTER — Ambulatory Visit (INDEPENDENT_AMBULATORY_CARE_PROVIDER_SITE_OTHER): Payer: Self-pay | Admitting: Physician Assistant

## 2017-03-12 VITALS — BP 158/88 | HR 79 | Temp 98.2°F | Wt 329.0 lb

## 2017-03-12 DIAGNOSIS — Z5321 Procedure and treatment not carried out due to patient leaving prior to being seen by health care provider: Secondary | ICD-10-CM

## 2017-03-12 NOTE — Progress Notes (Signed)
   Subjective:  Patient ID: Gary Buck, male    DOB: September 04, 1965  Age: 51 y.o. MRN: [id number]  CC: HTN  HPI Gary Buck is a 51 y.o. male with a medical history of    Past Medical History:  Diagnosis Date  . Anginal pain (HCC)   . Bipolar disorder (HCC)   . Depression   . ETOH abuse   . H/O cocaine abuse   . Hypercholesterolemia   . Hypertension   . PAF (paroxysmal atrial fibrillation) (HCC)    Gary Buck 12/21/2014  . Stroke [facility]) 2004   pt sts about 12 years ago.   . Tobacco abuse   . Type II diabetes mellitus (HCC) dx'd 12/21/2014        Outpatient Medications Prior to Visit  Medication Sig Dispense Refill  . amLODipine (NORVASC) 10 MG tablet Take 1 tablet (10 mg total) by mouth daily. 90 tablet 1  . apixaban (ELIQUIS) 5 MG TABS tablet Take 1 tablet (5 mg total) by mouth 2 (two) times daily. 60 tablet 11  . hydrochlorothiazide (HYDRODIURIL) 25 MG tablet Take 1 tablet (25 mg total) by mouth daily. Take on tablet in the morning. 90 tablet 1  . lisinopril (PRINIVIL,ZESTRIL) 40 MG tablet Take 1 tablet (40 mg total) by mouth daily. 30 tablet 0  . metoprolol tartrate (LOPRESSOR) 25 MG tablet Take 1 tablet (25 mg total) by mouth 2 (two) times daily. 60 tablet 0  . risperiDONE (RISPERDAL) 1 MG tablet Take 1 mg by mouth 2 (two) times daily.  0  . TRILEPTAL 300 MG tablet Take 300 mg by mouth 2 (two) times daily.  0  . nicotine (NICODERM CQ - DOSED IN MG/24 HOURS) 21 mg/24hr patch Place 1 patch (21 mg total) onto the skin daily. (Patient not taking: Reported on 02/26/2017) 28 patch 0   No facility-administered medications prior to visit.      ROS ROS  Objective:  BP (!) 158/88 (BP Location: Left Arm, Patient Position: Sitting, Cuff Size: Large)   Pulse 79   Temp 98.2 F (36.8 C) (Oral)   Wt (!) 329 lb (149.2 kg)   SpO2 98%   BMI 38.51 kg/m   BP/Weight 03/12/2017 03/11/2017 03/06/2017  Systolic BP 158 158 113  Diastolic BP 88 90 79  Wt. (Lbs) 329 - -  BMI 38.51 - -   Some encounter information is confidential and restricted. Go to Review Flowsheets activity to see all data.      Physical Exam   Assessment & Plan:    1. Patient left without being seen - pt arrived one hour early for appointment. Upset he had been waiting for so long. I was unable to get to patient until 45 minutes after his scheduled appointment.     Follow-up: No Follow-up on file.   Loletta Specter PA

## 2017-03-14 ENCOUNTER — Other Ambulatory Visit (INDEPENDENT_AMBULATORY_CARE_PROVIDER_SITE_OTHER): Payer: Self-pay | Admitting: Physician Assistant

## 2017-03-14 DIAGNOSIS — E278 Other specified disorders of adrenal gland: Secondary | ICD-10-CM

## 2017-03-14 NOTE — Congregational Nurse Program (Signed)
Congregational Nurse Program Note  Date of Encounter: 03/12/2017  Past Medical History: Past Medical History:  Diagnosis Date  . Anginal pain (HCC)   . Bipolar disorder (HCC)   . Depression   . ETOH abuse   . H/O cocaine abuse   . Hypercholesterolemia   . Hypertension   . PAF (paroxysmal atrial fibrillation) (HCC)    [name] 12/21/2014  . Stroke [facility]) 2004   pt sts about 12 years ago.   . Tobacco abuse   . Type II diabetes mellitus (HCC) dx'd 12/21/2014    Encounter Details:     CNP Questionnaire - 03/14/17 1700      Patient Demographics   Is this a new or existing patient? Existing   Patient is considered a/an Not Applicable   Race African-American/Black     Patient Assistance   Location of Patient Assistance Not Applicable   Patient's financial/insurance status Self-Pay (Uninsured)   Uninsured Patient ([place] Card/Care Connects) Yes   Interventions Follow-up/Education/Support provided after completed appt.   Patient referred to apply for the following financial assistance [employer] insecurities addressed Not Applicable   Transportation assistance Yes   Type of Assistance [employer] Given   Assistance securing medications No   Educational health offerings Chronic disease;Hypertension     Encounter Details   Primary purpose of visit Education/Health Concerns;Navigating the Healthcare System;Chronic Illness/Condition Visit   Was an Emergency Department visit averted? Not Applicable   Does patient have a medical provider? Yes   Patient referred to Follow up with established PCP   Was a mental health screening completed? (GAINS tool) No   Does patient have dental issues? No   Does patient have vision issues? No   Does your patient have an abnormal blood pressure today? Yes   Since previous encounter, have you referred patient for abnormal blood pressure that resulted in a new diagnosis or medication change? No   Does your patient have an  abnormal blood glucose today? No   Since previous encounter, have you referred patient for abnormal blood glucose that resulted in a new diagnosis or medication change? No   Was there a life-saving intervention made? No         Clinical Intake - 03/12/17 1430      Pre-visit preparation   Pre-visit preparation completed Yes     Abuse/Neglect   Do you feel unsafe in your current relationship? No   Do you feel physically threatened by others? No   Anyone hurting you at home, work, or school? No   Unable to ask? No     Patient Literacy   How often do you need to have someone help you when you read instructions, pamphlets, or other written materials from your doctor or pharmacy? 1 - Never     Web designer Needed? No     In today somewhat upset ,states he was at his MD appointment early and waited over 1 hour and was not seen so he left without being seen. Client out of medications ,needed follow up viist but was not seen . Nurse counseled about importance of follow up . Client was somewhat agitated today .Marland Kitchen Allowed time to be supportive and encouraged follow up with PCP . Blood pressure  Is high today . Client trying to get a job and bus tickets given for interview and made sure client understood  Bus tickets are for medical visits  Only in the future. Follow  weekly

## 2017-03-15 ENCOUNTER — Telehealth (INDEPENDENT_AMBULATORY_CARE_PROVIDER_SITE_OTHER): Payer: Self-pay | Admitting: Physician Assistant

## 2017-03-19 ENCOUNTER — Encounter: Payer: Self-pay | Admitting: Pediatric Intensive Care

## 2017-03-19 ENCOUNTER — Telehealth (INDEPENDENT_AMBULATORY_CARE_PROVIDER_SITE_OTHER): Payer: Self-pay

## 2017-03-19 NOTE — Telephone Encounter (Signed)
Called patient and gave appointment details for MRI on October 10 at 8am, with a 7:30 arrival time and nothing to eat or drink 4 hours prior to MRI. Patient repeated information to me, to ensure accuracy. Maryjean Morn, CMA

## 2017-03-19 NOTE — Congregational Nurse Program (Signed)
Congregational Nurse Program Note  Date of Encounter: 03/19/2017  Past Medical History: Past Medical History:  Diagnosis Date  . Anginal pain (HCC)   . Bipolar disorder (HCC)   . Depression   . ETOH abuse   . H/O cocaine abuse   . Hypercholesterolemia   . Hypertension   . PAF (paroxysmal atrial fibrillation) (HCC)    Gary Buck 12/21/2014  . Stroke [facility]) 2004   pt sts about 12 years ago.   . Tobacco abuse   . Type II diabetes mellitus (HCC) dx'd 12/21/2014    Encounter Details:     CNP Questionnaire - 03/19/17 2302      Patient Demographics   Is this a new or existing patient? Existing   Patient is considered a/an Not Applicable   Race African-American/Black     Patient Assistance   Location of Patient Assistance Not Applicable   Patient's financial/insurance status Self-Pay (Uninsured)   Uninsured Patient ([place] [facility]/Care Connects) Yes   Interventions Follow-up/Education/Support provided after completed appt.;Appt. has been completed   Patient referred to apply for the following financial assistance [employer] insecurities addressed Not Applicable   Transportation assistance Yes   Type of Assistance [employer] Given   Assistance securing medications No   Educational health offerings Medications     Encounter Details   Primary purpose of visit Navigating the Healthcare System;Education/Health Concerns;Spiritual Care/Support Visit   Does patient have a medical provider? Yes   Patient referred to Clinic   Was a mental health screening completed? (GAINS tool) No   Does patient have dental issues? No   Does patient have vision issues? No   Does your patient have an abnormal blood pressure today? No   Since previous encounter, have you referred patient for abnormal blood pressure that resulted in a new diagnosis or medication change? No   Does your patient have an abnormal blood glucose today? No   Since previous encounter, have you referred patient for  abnormal blood glucose that resulted in a new diagnosis or medication change? No   Was there a life-saving intervention made? No         Clinical Intake - 03/12/17 1430      Pre-visit preparation   Pre-visit preparation completed Yes     Abuse/Neglect   Do you feel unsafe in your current relationship? No   Do you feel physically threatened by others? No   Anyone hurting you at home, work, or school? No   Unable to ask? No     Patient Literacy   How often do you need to have someone help you when you read instructions, pamphlets, or other written materials from your doctor or pharmacy? 1 - Never     Web designer Needed? No    Nurse [facility] and was able to get client an appointment  For 03-21-17 @ 8:45 am ,gave client bus tickets and called to verify transportation application and was told  Again they had not  Received it . Nurse has faxed application 2 times will fax once more . Follow up with client

## 2017-03-21 ENCOUNTER — Ambulatory Visit (INDEPENDENT_AMBULATORY_CARE_PROVIDER_SITE_OTHER): Payer: Self-pay | Admitting: Physician Assistant

## 2017-03-21 ENCOUNTER — Encounter (INDEPENDENT_AMBULATORY_CARE_PROVIDER_SITE_OTHER): Payer: Self-pay | Admitting: Physician Assistant

## 2017-03-21 VITALS — BP 125/84 | HR 64 | Temp 98.0°F | Resp 18 | Ht 77.0 in | Wt 317.0 lb

## 2017-03-21 DIAGNOSIS — E279 Disorder of adrenal gland, unspecified: Secondary | ICD-10-CM

## 2017-03-21 DIAGNOSIS — G8929 Other chronic pain: Secondary | ICD-10-CM

## 2017-03-21 DIAGNOSIS — M25561 Pain in right knee: Secondary | ICD-10-CM

## 2017-03-21 DIAGNOSIS — R7303 Prediabetes: Secondary | ICD-10-CM

## 2017-03-21 DIAGNOSIS — E278 Other specified disorders of adrenal gland: Secondary | ICD-10-CM

## 2017-03-21 DIAGNOSIS — Z23 Encounter for immunization: Secondary | ICD-10-CM

## 2017-03-21 DIAGNOSIS — I10 Essential (primary) hypertension: Secondary | ICD-10-CM

## 2017-03-21 DIAGNOSIS — M25562 Pain in left knee: Secondary | ICD-10-CM

## 2017-03-21 DIAGNOSIS — Z760 Encounter for issue of repeat prescription: Secondary | ICD-10-CM

## 2017-03-21 MED ORDER — METFORMIN HCL 500 MG PO TABS: 500.0000 mg | ORAL_TABLET | Freq: Two times a day (BID) | ORAL | 1 refills | Status: DC

## 2017-03-21 MED ORDER — HYDROCHLOROTHIAZIDE 25 MG PO TABS: 25.0000 mg | ORAL_TABLET | Freq: Every day | ORAL | 1 refills | Status: DC

## 2017-03-21 MED ORDER — ACETAMINOPHEN 500 MG PO TABS: 1000.0000 mg | ORAL_TABLET | Freq: Three times a day (TID) | ORAL | 0 refills | Status: AC | PRN

## 2017-03-21 MED ORDER — LISINOPRIL 40 MG PO TABS: 40.0000 mg | ORAL_TABLET | Freq: Every day | ORAL | 1 refills | Status: DC

## 2017-03-21 MED ORDER — AMLODIPINE BESYLATE 10 MG PO TABS: 10.0000 mg | ORAL_TABLET | Freq: Every day | ORAL | 1 refills | Status: DC

## 2017-03-21 MED ORDER — APIXABAN 5 MG PO TABS: 5.0000 mg | ORAL_TABLET | Freq: Two times a day (BID) | ORAL | 3 refills | Status: DC

## 2017-03-21 MED ORDER — METOPROLOL TARTRATE 25 MG PO TABS: 25.0000 mg | ORAL_TABLET | Freq: Two times a day (BID) | ORAL | 1 refills | Status: DC

## 2017-03-21 NOTE — Progress Notes (Signed)
Subjective:  Patient ID: Gary Buck, male    DOB: [birth year]  Age: 51 y.o. MRN: [id number]  CC: f/u  HPI  Gary Buck is a 51 y.o. male with a medical history of HTN, HLD, DM2, paroxysmal Afib, stroke, bipolar disorder, depression, ETOH abuse, cocaine use, and tobacco abuse presents to f/u on HTN.  Blood pressure is significantly decreased from 192/103 mmHg on 02/12/17, 158/88 mmHg two weeks ago, and 125/84 mmHg today. Taking anti-hypertensives as directed but will need a refill soon. Otherwise, patient feels well. No chest pain, palpitations, SOB, HA, tingling, numbness, presyncope, syncope, PND, orthopnea, or claudication.     Continues to have knee pain and would like to be prescribed a pain killer.      Outpatient Medications Prior to Visit  Medication Sig Dispense Refill  . amLODipine (NORVASC) 10 MG tablet Take 1 tablet (10 mg total) by mouth daily. 90 tablet 1  . apixaban (ELIQUIS) 5 MG TABS tablet Take 1 tablet (5 mg total) by mouth 2 (two) times daily. 60 tablet 11  . hydrochlorothiazide (HYDRODIURIL) 25 MG tablet Take 1 tablet (25 mg total) by mouth daily. Take on tablet in the morning. 90 tablet 1  . lisinopril (PRINIVIL,ZESTRIL) 40 MG tablet Take 1 tablet (40 mg total) by mouth daily. 30 tablet 0  . metoprolol tartrate (LOPRESSOR) 25 MG tablet Take 1 tablet (25 mg total) by mouth 2 (two) times daily. 60 tablet 0  . nicotine (NICODERM CQ - DOSED IN MG/24 HOURS) 21 mg/24hr patch Place 1 patch (21 mg total) onto the skin daily. 28 patch 0  . risperiDONE (RISPERDAL) 1 MG tablet Take 1 mg by mouth 2 (two) times daily.  0  . TRILEPTAL 300 MG tablet Take 300 mg by mouth 2 (two) times daily.  0   No facility-administered medications prior to visit.      ROS Review of Systems  Constitutional: Negative for chills, fever and malaise/fatigue.  Eyes: Negative for blurred vision.  Respiratory: Negative for shortness of breath.   Cardiovascular: Negative for chest pain and  palpitations.  Gastrointestinal: Negative for abdominal pain and nausea.  Genitourinary: Negative for dysuria and hematuria.  Musculoskeletal: Negative for joint pain and myalgias.  Skin: Negative for rash.  Neurological: Negative for tingling and headaches.  Psychiatric/Behavioral: Negative for depression. The patient is not nervous/anxious.     Objective:  BP 125/84 (BP Location: Left Arm, Patient Position: Sitting, Cuff Size: Large)   Pulse 64   Temp 98 F (36.7 C) (Oral)   Resp 18   Ht  (1.956 m)   Wt (!) 317 lb (143.8 kg)   SpO2 98%   BMI 37.59 kg/m   BP/Weight 03/21/2017 03/12/2017 03/12/2017  Systolic BP 125 166 158  Diastolic BP 84 98 88  Wt. (Lbs) 317 - 329  BMI 37.59 - 38.51  Some encounter information is confidential and restricted. Go to Review Flowsheets activity to see all data.      Physical Exam  Constitutional: He is oriented to person, place, and time.  Well developed, well nourished, NAD, polite  HENT:  Head: Normocephalic and atraumatic.  Eyes: No scleral icterus.  Neck: Normal range of motion. Neck supple. No thyromegaly present.  Cardiovascular: Normal rate, regular rhythm and normal heart sounds.   No lower extremity edema bilaterally  Pulmonary/Chest: Effort normal and breath sounds normal.  Musculoskeletal: He exhibits no edema.  Neurological: He is alert and oriented to person, place, and time.  Skin: Skin is  warm and dry. No rash noted. No erythema. No pallor.  Psychiatric: He has a normal mood and affect. His behavior is normal. Thought content normal.  Vitals reviewed.    Assessment & Plan:    1. Hypertension, unspecified type - Refill metoprolol tartrate (LOPRESSOR) 25 MG tablet; Take 1 tablet (25 mg total) by mouth 2 (two) times daily.  Dispense: 180 tablet; Refill: 1 - Refill lisinopril (PRINIVIL,ZESTRIL) 40 MG tablet; Take 1 tablet (40 mg total) by mouth daily.  Dispense: 90 tablet; Refill: 1 - Refill hydrochlorothiazide  (HYDRODIURIL) 25 MG tablet; Take 1 tablet (25 mg total) by mouth daily. Take on tablet in the morning.  Dispense: 90 tablet; Refill: 1 - Refill amLODipine (NORVASC) 10 MG tablet; Take 1 tablet (10 mg total) by mouth daily.  Dispense: 90 tablet; Refill: 1  2. Need for Tdap vaccination - Tdap vaccine greater than or equal to 7yo IM  3. Need for prophylactic vaccination against Streptococcus pneumoniae (pneumococcus) - Pneumococcal conjugate vaccine 13-valent  4. Need for prophylactic vaccination and inoculation against influenza - Flu Vaccine QUAD 6+ mos PF IM (Fluarix Quad PF)  5. Chronic pain of both knees - Tylenol 1000 mg TID  6. Left adrenal mass (HCC) - MRI on 03/27/17  7. Prediabetes - Begin metFORMIN (GLUCOPHAGE) 500 MG tablet; Take 1 tablet (500 mg total) by mouth 2 (two) times daily with a meal.  Dispense: 180 tablet; Refill: 1  8. Medication refill - Refill apixaban (ELIQUIS) 5 MG TABS tablet; Take 1 tablet (5 mg total) by mouth 2 (two) times daily.  Dispense: 180 tablet; Refill: 3   Meds ordered this encounter  Medications  . metoprolol tartrate (LOPRESSOR) 25 MG tablet    Sig: Take 1 tablet (25 mg total) by mouth 2 (two) times daily.    Dispense:  180 tablet    Refill:  1    Order Specific Question:   Supervising Provider    Answer:   Quentin Angst [id number]  . lisinopril (PRINIVIL,ZESTRIL) 40 MG tablet    Sig: Take 1 tablet (40 mg total) by mouth daily.    Dispense:  90 tablet    Refill:  1    Order Specific Question:   Supervising Provider    Answer:   Quentin Angst [id number]  . hydrochlorothiazide (HYDRODIURIL) 25 MG tablet    Sig: Take 1 tablet (25 mg total) by mouth daily. Take on tablet in the morning.    Dispense:  90 tablet    Refill:  1    Order Specific Question:   Supervising Provider    Answer:   Quentin Angst [id number]  . apixaban (ELIQUIS) 5 MG TABS tablet    Sig: Take 1 tablet (5 mg total) by mouth 2 (two) times daily.     Dispense:  180 tablet    Refill:  3    Order Specific Question:   Supervising Provider    Answer:   Quentin Angst [id number]  . amLODipine (NORVASC) 10 MG tablet    Sig: Take 1 tablet (10 mg total) by mouth daily.    Dispense:  90 tablet    Refill:  1    Order Specific Question:   Supervising Provider    Answer:   Quentin Angst [id number]  . acetaminophen (TYLENOL) 500 MG tablet    Sig: Take 2 tablets (1,000 mg total) by mouth every 8 (eight) hours as needed.    Dispense:  42 tablet    Refill:  0    Order Specific Question:   Supervising Provider    Answer:   Quentin Angst [id number]  . metFORMIN (GLUCOPHAGE) 500 MG tablet    Sig: Take 1 tablet (500 mg total) by mouth 2 (two) times daily with a meal.    Dispense:  180 tablet    Refill:  1    Order Specific Question:   Supervising Provider    AnswerQuentin Angst [[id number]]    Follow-up: 4 weeks for left adrenal mass  Loletta Specter PA

## 2017-03-21 NOTE — Patient Instructions (Addendum)
DASH Eating Plan DASH stands for "Dietary Approaches to Stop Hypertension." The DASH eating plan is a healthy eating plan that has been shown to reduce high blood pressure (hypertension). It may also reduce your risk for type 2 diabetes, heart disease, and stroke. The DASH eating plan may also help with weight loss. What are tips for following this plan? General guidelines  Avoid eating more than 2,300 mg (milligrams) of salt (sodium) a day. If you have hypertension, you may need to reduce your sodium intake to 1,500 mg a day.  Limit alcohol intake to no more than 1 drink a day for nonpregnant women and 2 drinks a day for men. One drink equals 12 oz of beer, 5 oz of wine, or 1 oz of hard liquor.  Work with your health care provider to maintain a healthy body weight or to lose weight. Ask what an ideal weight is for you.  Get at least 30 minutes of exercise that causes your heart to beat faster (aerobic exercise) most days of the week. Activities may include walking, swimming, or biking.  Work with your health care provider or diet and nutrition specialist (dietitian) to adjust your eating plan to your individual calorie needs. Reading food labels  Check food labels for the amount of sodium per serving. Choose foods with less than 5 percent of the Daily Value of sodium. Generally, foods with less than 300 mg of sodium per serving fit into this eating plan.  To find whole grains, look for the word "whole" as the first word in the ingredient list. Shopping  Buy products labeled as "low-sodium" or "no salt added."  Buy fresh foods. Avoid canned foods and premade or frozen meals. Cooking  Avoid adding salt when cooking. Use salt-free seasonings or herbs instead of table salt or sea salt. Check with your health care provider or pharmacist before using salt substitutes.  Do not fry foods. Cook foods using healthy methods such as baking, boiling, grilling, and broiling instead.  Cook with  heart-healthy oils, such as olive, canola, soybean, or sunflower oil. Meal planning   Eat a balanced diet that includes: ? 5 or more servings of fruits and vegetables each day. At each meal, try to fill half of your plate with fruits and vegetables. ? Up to 6-8 servings of whole grains each day. ? Less than 6 oz of lean meat, poultry, or fish each day. A 3-oz serving of meat is about the same size as a deck of cards. One egg equals 1 oz. ? 2 servings of low-fat dairy each day. ? A serving of nuts, seeds, or beans 5 times each week. ? Heart-healthy fats. Healthy fats called Omega-3 fatty acids are found in foods such as flaxseeds and coldwater fish, like sardines, salmon, and mackerel.  Limit how much you eat of the following: ? Canned or prepackaged foods. ? Food that is high in trans fat, such as fried foods. ? Food that is high in saturated fat, such as fatty meat. ? Sweets, desserts, sugary drinks, and other foods with added sugar. ? Full-fat dairy products.  Do not salt foods before eating.  Try to eat at least 2 vegetarian meals each week.  Eat more home-cooked food and less restaurant, buffet, and fast food.  When eating at a restaurant, ask that your food be prepared with less salt or no salt, if possible. What foods are recommended? The items listed may not be a complete list. Talk with your dietitian about what   dietary choices are best for you. Grains Whole-grain or whole-wheat bread. Whole-grain or whole-wheat pasta. Brown rice. Oatmeal. Quinoa. Bulgur. Whole-grain and low-sodium cereals. Pita bread. Low-fat, low-sodium crackers. Whole-wheat flour tortillas. Vegetables Fresh or frozen vegetables (raw, steamed, roasted, or grilled). Low-sodium or reduced-sodium tomato and vegetable juice. Low-sodium or reduced-sodium tomato sauce and tomato paste. Low-sodium or reduced-sodium canned vegetables. Fruits All fresh, dried, or frozen fruit. Canned fruit in natural juice (without  added sugar). Meat and other protein foods Skinless chicken or turkey. Ground chicken or turkey. Pork with fat trimmed off. Fish and seafood. Egg whites. Dried beans, peas, or lentils. Unsalted nuts, nut butters, and seeds. Unsalted canned beans. Lean cuts of beef with fat trimmed off. Low-sodium, lean deli meat. Dairy Low-fat (1%) or fat-free (skim) milk. Fat-free, low-fat, or reduced-fat cheeses. Nonfat, low-sodium ricotta or cottage cheese. Low-fat or nonfat yogurt. Low-fat, low-sodium cheese. Fats and oils Soft margarine without trans fats. Vegetable oil. Low-fat, reduced-fat, or light mayonnaise and salad dressings (reduced-sodium). Canola, safflower, olive, soybean, and sunflower oils. Avocado. Seasoning and other foods Herbs. Spices. Seasoning mixes without salt. Unsalted popcorn and pretzels. Fat-free sweets. What foods are not recommended? The items listed may not be a complete list. Talk with your dietitian about what dietary choices are best for you. Grains Baked goods made with fat, such as croissants, muffins, or some breads. Dry pasta or rice meal packs. Vegetables Creamed or fried vegetables. Vegetables in a cheese sauce. Regular canned vegetables (not low-sodium or reduced-sodium). Regular canned tomato sauce and paste (not low-sodium or reduced-sodium). Regular tomato and vegetable juice (not low-sodium or reduced-sodium). Pickles. Olives. Fruits Canned fruit in a light or heavy syrup. Fried fruit. Fruit in cream or butter sauce. Meat and other protein foods Fatty cuts of meat. Ribs. Fried meat. Bacon. Sausage. Bologna and other processed lunch meats. Salami. Fatback. Hotdogs. Bratwurst. Salted nuts and seeds. Canned beans with added salt. Canned or smoked fish. Whole eggs or egg yolks. Chicken or turkey with skin. Dairy Whole or 2% milk, cream, and half-and-half. Whole or full-fat cream cheese. Whole-fat or sweetened yogurt. Full-fat cheese. Nondairy creamers. Whipped toppings.  Processed cheese and cheese spreads. Fats and oils Butter. Stick margarine. Lard. Shortening. Ghee. Bacon fat. Tropical oils, such as coconut, palm kernel, or palm oil. Seasoning and other foods Salted popcorn and pretzels. Onion salt, garlic salt, seasoned salt, table salt, and sea salt. Worcestershire sauce. Tartar sauce. Barbecue sauce. Teriyaki sauce. Soy sauce, including reduced-sodium. Steak sauce. Canned and packaged gravies. Fish sauce. Oyster sauce. Cocktail sauce. Horseradish that you find on the shelf. Ketchup. Mustard. Meat flavorings and tenderizers. Bouillon cubes. Hot sauce and Tabasco sauce. Premade or packaged marinades. Premade or packaged taco seasonings. Relishes. Regular salad dressings. Where to find more information:  National Heart, Lung, and Blood Institute: www.nhlbi.nih.gov  American Heart Association: www.heart.org Summary  The DASH eating plan is a healthy eating plan that has been shown to reduce high blood pressure (hypertension). It may also reduce your risk for type 2 diabetes, heart disease, and stroke.  With the DASH eating plan, you should limit salt (sodium) intake to 2,300 mg a day. If you have hypertension, you may need to reduce your sodium intake to 1,500 mg a day.  When on the DASH eating plan, aim to eat more fresh fruits and vegetables, whole grains, lean proteins, low-fat dairy, and heart-healthy fats.  Work with your health care provider or diet and nutrition specialist (dietitian) to adjust your eating plan to your individual   calorie needs. This information is not intended to replace advice given to you by your health care provider. Make sure you discuss any questions you have with your health care provider. Document Released: 05/24/2011 Document Revised: 05/28/2016 Document Reviewed: 05/28/2016 Elsevier Interactive Patient Education  2017 Else[employer] Inc.  Prediabetes Prediabetes is the condition of having a blood sugar (blood glucose) level that  is higher than it should be, but not high enough for you to be diagnosed with type 2 diabetes. Having prediabetes puts you at risk for developing type 2 diabetes (type 2 diabetes mellitus). Prediabetes may be called impaired glucose tolerance or impaired fasting glucose. Prediabetes usually does not cause symptoms. Your health care provider can diagnose this condition with blood tests. You may be tested for prediabetes if you are overweight and if you have at least one other risk factor for prediabetes. Risk factors for prediabetes include:  Having a family member with type 2 diabetes.  Being overweight or obese.  Being older than age 6.  Being of American-Indian, African-American, Hispanic/Latino, or Asian/Pacific Islander descent.  Having an inactive (sedentary) lifestyle.  Having a history of gestational diabetes or polycystic ovarian syndrome (PCOS).  Having low levels of good cholesterol (HDL-C) or high levels of blood fats (triglycerides).  Having high blood pressure.  What is blood glucose and how is blood glucose measured?  Blood glucose refers to the amount of glucose in your bloodstream. Glucose comes from eating foods that contain sugars and starches (carbohydrates) that the body breaks down into glucose. Your blood glucose level may be measured in mg/dL (milligrams per deciliter) or mmol/L (millimoles per liter).Your blood glucose may be checked with one or more of the following blood tests:  A fasting blood glucose (FBG) test. You will not be allowed to eat (you will fast) for at least 8 hours before a blood sample is taken. ? A normal range for FBG is 70-100 mg/dl (9.9-3.7 mmol/L).  An A1c (hemoglobin A1c) blood test. This test provides information about blood glucose control over the previous 2?3months.  An oral glucose tolerance test (OGTT). This test measures your blood glucose twice: ? After fasting. This is your baseline level. ? Two hours after you drink a  beverage that contains glucose.  You may be diagnosed with prediabetes:  If your FBG is 100?125 mg/dL (1.6-9.6 mmol/L).  If your A1c level is 5.7?6.4%.  If your OGGT result is 140?199 mg/dL (7.8-93 mmol/L).  These blood tests may be repeated to confirm your diagnosis. What happens if blood glucose is too high? The pancreas produces a hormone (insulin) that helps move glucose from the bloodstream into cells. When cells in the body do not respond properly to insulin that the body makes (insulin resistance), excess glucose builds up in the blood instead of going into cells. As a result, high blood glucose (hyperglycemia) can develop, which can cause many complications. This is a symptom of prediabetes. What can happen if blood glucose stays higher than normal for a long time? Having high blood glucose for a long time is dangerous. Too much glucose in your blood can damage your nerves and blood vessels. Long-term damage can lead to complications from diabetes, which may include:  Heart disease.  Stroke.  Blindness.  Kidney disease.  Depression.  Poor circulation in the feet and legs, which could lead to surgical removal (amputation) in severe cases.  How can prediabetes be prevented from turning into type 2 diabetes?  To help prevent type 2 diabetes, take the  following actions:  Be physically active. ? Do moderate-intensity physical activity for at least 30 minutes on at least 5 days of the week, or as much as told by your health care provider. This could be brisk walking, biking, or water aerobics. ? Ask your health care provider what activities are safe for you. A mix of physical activities may be best, such as walking, swimming, cycling, and strength training.  Lose weight as told by your health care provider. ? Losing 5-7% of your body weight can reverse insulin resistance. ? Your health care provider can determine how much weight loss is best for you and can help you lose  weight safely.  Follow a healthy meal plan. This includes eating lean proteins, complex carbohydrates, fresh fruits and vegetables, low-fat dairy products, and healthy fats. ? Follow instructions from your health care provider about eating or drinking restrictions. ? Make an appointment to see a diet and nutrition specialist (registered dietitian) to help you create a healthy eating plan that is right for you.  Do not smoke or use any tobacco products, such as cigarettes, chewing tobacco, and e-cigarettes. If you need help quitting, ask your health care provider.  Take over-the-counter and prescription medicines as told by your health care provider. You may be prescribed medicines that help lower the risk of type 2 diabetes.  This information is not intended to replace advice given to you by your health care provider. Make sure you discuss any questions you have with your health care provider. Document Released: 09/26/2015 Document Revised: 11/10/2015 Document Reviewed: 07/26/2015 Elsevier Interactive Patient Education  [employer].

## 2017-03-24 NOTE — Congregational Nurse Program (Signed)
Congregational Nurse Program Note  Date of Encounter: 03/04/2017  Past Medical History: Past Medical History:  Diagnosis Date  . Anginal pain (HCC)   . Bipolar disorder (HCC)   . Depression   . ETOH abuse   . H/O cocaine abuse   . Hypercholesterolemia   . Hypertension   . PAF (paroxysmal atrial fibrillation) (HCC)    Gary Buck 12/21/2014  . Stroke [facility]) 2004   pt sts about 12 years ago.   . Tobacco abuse   . Type II diabetes mellitus (HCC) dx'd 12/21/2014    Encounter Details:     CNP Questionnaire - 03/19/17 2306      Encounter Details   Was an Emergency Department visit averted? Not Applicable         Clinical Intake - 03/21/17 0905      Pre-visit preparation   Pre-visit preparation completed Yes     Pain   Pain  No/denies pain     Functional Status   Activities of Daily Living Independent   Ambulation Independent   Medication Administration Independent   Home Management Independent     Abuse/Neglect   Do you feel unsafe in your current relationship? No   Do you feel physically threatened by others? No   Anyone hurting you at home, work, or school? No   Unable to ask? No    BP check. Client states he completed appointment at [facility].

## 2017-03-26 ENCOUNTER — Encounter: Payer: Self-pay | Admitting: Pediatric Intensive Care

## 2017-03-27 ENCOUNTER — Other Ambulatory Visit (INDEPENDENT_AMBULATORY_CARE_PROVIDER_SITE_OTHER): Payer: Self-pay | Admitting: Physician Assistant

## 2017-03-27 ENCOUNTER — Ambulatory Visit (HOSPITAL_COMMUNITY)
Admission: RE | Admit: 2017-03-27 | Discharge: 2017-03-27 | Disposition: A | Discharge status: Home / Self Care | Payer: Self-pay | Source: Ambulatory Visit | Attending: Physician Assistant | Admitting: Physician Assistant

## 2017-03-27 DIAGNOSIS — N281 Cyst of kidney, acquired: Secondary | ICD-10-CM | POA: Insufficient documentation

## 2017-03-27 DIAGNOSIS — Q453 Other congenital malformations of pancreas and pancreatic duct: Secondary | ICD-10-CM | POA: Insufficient documentation

## 2017-03-27 DIAGNOSIS — E278 Other specified disorders of adrenal gland: Secondary | ICD-10-CM

## 2017-03-27 DIAGNOSIS — E279 Disorder of adrenal gland, unspecified: Secondary | ICD-10-CM | POA: Insufficient documentation

## 2017-03-27 MED ORDER — GADOBENATE DIMEGLUMINE 529 MG/ML IV SOLN
20.0000 mL | Freq: Once | INTRAVENOUS | Status: AC
  Administered 2017-03-27: 20 mL via INTRAVENOUS

## 2017-03-27 MED FILL — !ELIQUIS 5 MG TABLET: 5 | 30 days supply | Qty: 60 | Fill #1

## 2017-03-27 MED FILL — ?METFORMIN HCL 500MG TABLET: 500 | 60 days supply | Qty: 60 | Fill #0

## 2017-03-27 MED FILL — ?METOPROLOL 25 MG TABLET: 25 | 30 days supply | Qty: 60 | Fill #0

## 2017-03-27 MED FILL — HYDROCHLOROTHIAZIDE 25 MG T: 25 | 30 days supply | Qty: 30 | Fill #1

## 2017-03-27 MED FILL — AMLODIPINE BESYLATE 10 MG T: 10 | 30 days supply | Qty: 30 | Fill #1

## 2017-03-27 MED FILL — LISINOPRIL 40 MG TABLET: 40 | 30 days supply | Qty: 30 | Fill #0

## 2017-03-29 ENCOUNTER — Other Ambulatory Visit (INDEPENDENT_AMBULATORY_CARE_PROVIDER_SITE_OTHER): Payer: Self-pay | Admitting: Physician Assistant

## 2017-03-29 DIAGNOSIS — Z1389 Encounter for screening for other disorder: Secondary | ICD-10-CM

## 2017-03-29 DIAGNOSIS — N281 Cyst of kidney, acquired: Secondary | ICD-10-CM

## 2017-04-09 ENCOUNTER — Other Ambulatory Visit: Payer: Self-pay | Admitting: *Deleted

## 2017-04-09 DIAGNOSIS — Z760 Encounter for issue of repeat prescription: Secondary | ICD-10-CM

## 2017-04-09 MED ORDER — APIXABAN 5 MG PO TABS: 5.0000 mg | ORAL_TABLET | Freq: Two times a day (BID) | ORAL | 3 refills | Status: DC

## 2017-04-09 NOTE — Telephone Encounter (Signed)
PRINTED FOR PASS PROGRAM 

## 2017-04-11 ENCOUNTER — Encounter (HOSPITAL_COMMUNITY): Payer: Self-pay

## 2017-04-11 ENCOUNTER — Ambulatory Visit (HOSPITAL_COMMUNITY)
Admission: RE | Admit: 2017-04-11 | Discharge: 2017-04-11 | Disposition: A | Discharge status: Home / Self Care | Payer: Self-pay | Source: Ambulatory Visit | Attending: Physician Assistant | Admitting: Physician Assistant

## 2017-04-11 DIAGNOSIS — Z1389 Encounter for screening for other disorder: Secondary | ICD-10-CM | POA: Insufficient documentation

## 2017-04-11 DIAGNOSIS — N281 Cyst of kidney, acquired: Secondary | ICD-10-CM

## 2017-04-11 LAB — POCT I-STAT CREATININE: Creatinine, Ser: 0.7 mg/dL (ref 0.61–1.24)

## 2017-04-16 NOTE — Congregational Nurse Program (Signed)
Congregational Nurse Program Note  Date of Encounter: 04/16/2017  Past Medical History: Past Medical History:  Diagnosis Date  . Anginal pain (HCC)   . Bipolar disorder (HCC)   . Depression   . ETOH abuse   . H/O cocaine abuse   . Hypercholesterolemia   . Hypertension   . PAF (paroxysmal atrial fibrillation) (HCC)    [name]/notes 12/21/2014  . Stroke [facility](HCC) 2004   pt sts about 12 years ago.   . Tobacco abuse   . Type II diabetes mellitus (HCC) dx'd 12/21/2014    Encounter Details:     CNP Questionnaire - 04/16/17 1938      Patient Demographics   Is this a new or existing patient? New   Patient is considered a/an Not Applicable   Race African-American/Black     Patient Assistance   Location of Patient Assistance Not Applicable   Patient's financial/insurance status Self-Pay (Uninsured)   Uninsured Patient ([facility]/Care Connects) Yes   Interventions Follow-up/Education/Support provided after completed appt.   Patient referred to apply for the following financial assistance Not Applicable   Food insecurities addressed Not Applicable   Transportation assistance No   Assistance securing medications No   Educational health offerings Medications;Hypertension     Encounter Details   Primary purpose of visit Spiritual Care/Support Visit;Education/Health Concerns;Navigating the Healthcare System   Was an Emergency Department visit averted? Not Applicable   Does patient have a medical provider? Yes   Patient referred to Follow up with established PCP;Clinic   Was a mental health screening completed? (GAINS tool) No   Does patient have dental issues? No   Does patient have vision issues? No   Does your patient have an abnormal blood pressure today? Yes   Since previous encounter, have you referred patient for abnormal blood pressure that resulted in a new diagnosis or medication change? No   Does your patient have an abnormal blood glucose today? No   Since previous encounter, have  you referred patient for abnormal blood glucose that resulted in a new diagnosis or medication change? No   Was there a life-saving intervention made? No    Client in today stating he has been working secured a new job but is not happy right now ,seems to have a lot of things going on admits he needs to return to [facility] ,nurse called but was told with client present they had no record of him going there ,client became a little agitated and stated he would take care of it ,gave phone number to client . Client had smoked before coming in but  Blood pressure has improved as he states he kept his appointment in October ,got his medications and has been taking them . Marland Kitchen. Client thinking about moving back to [place] ,and [place] . Nurse allowed client to vent and was supportive until he continued on and seemed to be agitated the more he vented about the world and things going on and with his little pay an hopes of of getting housing and being able to sustain housing and keep it on his salary. Continue to follow a support weekly. Client had not been in because of his work schedule . States he is eating healthier also.

## 2017-04-22 ENCOUNTER — Ambulatory Visit (INDEPENDENT_AMBULATORY_CARE_PROVIDER_SITE_OTHER): Payer: Self-pay | Admitting: Physician Assistant

## 2017-04-23 NOTE — Congregational Nurse Program (Signed)
Congregational Nurse Program Note  Date of Encounter: 03/19/2017  Past Medical History: Past Medical History:  Diagnosis Date  . Anginal pain (HCC)   . Bipolar disorder (HCC)   . Depression   . ETOH abuse   . H/O cocaine abuse   . Hypercholesterolemia   . Hypertension   . PAF (paroxysmal atrial fibrillation) (HCC)    [name]/notes 12/21/2014  . Stroke [facility](HCC) 2004   pt sts about 12 years ago.   . Tobacco abuse   . Type II diabetes mellitus (HCC) dx'd 12/21/2014    Encounter Details:  BP check

## 2017-04-24 NOTE — Congregational Nurse Program (Signed)
Congregational Nurse Program Note  Date of Encounter: 03/26/2017  Past Medical History: Past Medical History:  Diagnosis Date  . Anginal pain (HCC)   . Bipolar disorder (HCC)   . Depression   . ETOH abuse   . H/O cocaine abuse   . Hypercholesterolemia   . Hypertension   . PAF (paroxysmal atrial fibrillation) (HCC)    Gary Buck/notes 12/21/2014  . Stroke [facility](HCC) 2004   pt sts about 12 years ago.   . Tobacco abuse   . Type II diabetes mellitus (HCC) dx'd 12/21/2014    Encounter Details: CNP Questionnaire - 04/16/17 1938      Questionnaire   Patient Status  Not Applicable    Race  African-American/Black    Location Patient Served At  Not Applicable    Insurance  Self-Pay (Uninsured)    Uninsured  Yes    Food  Not Applicable    Medical Provider  Yes    Referrals  Follow up with established PCP;Clinic    ED Visit Averted  Not Applicable    Life-Saving Intervention Made  No      Retired Questions   Is this a new or existing patient?  New    Interventions  Follow-up/Education/Support provided after completed appt.    Patient referred to apply for the following financial assistance  Not Applicable    Transportation assistance  No    Assistance securing medications  No    Educational health offerings  Medications;Hypertension    Primary purpose of visit  Spiritual Care/Support Visit;Education/Health Concerns;Navigating the Healthcare System    Was a mental health screening completed? (GAINS tool)  No    Does patient have dental issues?  No    Does patient have vision issues?  No    Does your patient have an abnormal blood pressure today?  Yes    Since previous encounter, have you referred patient for abnormal blood pressure that resulted in a new diagnosis or medication change?  No    Does your patient have an abnormal blood glucose today?  No    Since previous encounter, have you referred patient for abnormal blood glucose that resulted in a new diagnosis or medication change?  No       BP check. Client has completed appointment at [facility], Return to CN clinic as needed.

## 2017-04-24 NOTE — Congregational Nurse Program (Signed)
Congregational Nurse Program Note  Date of Encounter: 04/24/2017  Past Medical History: Past Medical History:  Diagnosis Date  . Anginal pain (HCC)   . Bipolar disorder (HCC)   . Depression   . ETOH abuse   . H/O cocaine abuse   . Hypercholesterolemia   . Hypertension   . PAF (paroxysmal atrial fibrillation) (HCC)    [name]/notes 12/21/2014  . Stroke [facility](HCC) 2004   pt sts about 12 years ago.   . Tobacco abuse   . Type II diabetes mellitus (HCC) dx'd 12/21/2014    Encounter Details: CNP Questionnaire - 04/24/17 1802      Questionnaire   Patient Status  Not Applicable    Race  Black or African American    Location Patient Served At  Not Applicable    Insurance  Not Applicable    Uninsured  Uninsured (Subsequent visits/quarter)    Food  No food insecurities    Housing/Utilities  No permanent housing    Transportation  No transportation needs    Interpersonal Safety  Yes, feel physically and emotionally safe where you currently live    Medication  No medication insecurities    Medical Provider  Yes    Referrals  Primary Care Provider/Clinic;Behavioral/Mental Health Provider    ED Visit Averted  Not Applicable    Life-Saving Intervention Made  Not Applicable     In for blood pressure check ,has a new job that requires him to be out of town several days a week . Doing okay .Missed his MD appointment ,counseled to call and reschedule . Continues to take his blood pressure medications he states ,needs to follow up with mental health appointment. Counseled regarding smoking and blood pressure. Follow as needed!

## 2017-04-26 ENCOUNTER — Encounter: Payer: Self-pay | Admitting: Pediatric Intensive Care

## 2017-05-06 ENCOUNTER — Ambulatory Visit (INDEPENDENT_AMBULATORY_CARE_PROVIDER_SITE_OTHER): Payer: Self-pay | Admitting: Physician Assistant

## 2017-05-15 ENCOUNTER — Telehealth: Payer: Self-pay

## 2017-05-15 NOTE — Congregational Nurse Program (Signed)
Congregational Nurse Program Note  Date of Encounter: 05/14/2017  Past Medical History: Past Medical History:  Diagnosis Date  . Anginal pain (HCC)   . Bipolar disorder (HCC)   . Depression   . ETOH abuse   . H/O cocaine abuse   . Hypercholesterolemia   . Hypertension   . PAF (paroxysmal atrial fibrillation) (HCC)    [name]/notes 12/21/2014  . Stroke [facility](HCC) 2004   pt sts about 12 years ago.   . Tobacco abuse   . Type II diabetes mellitus (HCC) dx'd 12/21/2014    Encounter Details: CNP Questionnaire - 05/15/17 2241      Questionnaire   Patient Status  Not Applicable    Race  Black or African American    Location Patient Served At  Not Applicable    Insurance  Not Applicable    Uninsured  Uninsured (Subsequent visits/quarter)    Food  Yes, have food insecurities    Housing/Utilities  No permanent housing    Transportation  Yes, need transportation assistance    Interpersonal Safety  Yes, feel physically and emotionally safe where you currently live    Medication  Yes, have medication insecurities    Medical Provider  Yes    Referrals  Primary Care Provider/Clinic    ED Visit Averted  Yes    Life-Saving Intervention Made  Not Applicable     Client in today states he has been out of town working and missend his appointment with his PCP . Has ran out of his medications and hasn't been on them for 2-3 weeks ??   Nurse counseled regarding his high blood  Pressure and the need to be on his medication . Client understands and ask that nurse help him with getting another appointment . Counseled about missed appointments.  Nurse will call to reschedule and notify client.

## 2017-05-15 NOTE — Congregational Nurse Program (Signed)
Congregational Nurse Program Note  Date of Encounter: 05/15/2017  Past Medical History: Past Medical History:  Diagnosis Date  . Anginal pain (HCC)   . Bipolar disorder (HCC)   . Depression   . ETOH abuse   . H/O cocaine abuse   . Hypercholesterolemia   . Hypertension   . PAF (paroxysmal atrial fibrillation) (HCC)    [name]/notes 12/21/2014  . Stroke [facility](HCC) 2004   pt sts about 12 years ago.   . Tobacco abuse   . Type II diabetes mellitus (HCC) dx'd 12/21/2014    Encounter Details: CNP Questionnaire - 05/15/17 2249      Questionnaire   Patient Status  Not Applicable    Race  Black or African American    Location Patient Served At  Not Applicable    Insurance  Not Applicable    Uninsured  Uninsured (Subsequent visits/quarter)    Food  Yes, have food insecurities    Housing/Utilities  No permanent housing    Transportation  Yes, need transportation assistance    Interpersonal Safety  Yes, feel physically and emotionally safe where you currently live    Medication  Yes, have medication insecurities    Medical Provider  Yes    Referrals  Primary Care Provider/Clinic    ED Visit Averted  Yes    Life-Saving Intervention Made  Not Applicable     Spoke with client in hallway,gave him appointment time  Friday at 11:30 am [facility] and counseled him  Regarding keeping appointment and his high blood pressure. Needs his blood pressure medication ,has been off of them for several weeks.

## 2017-05-15 NOTE — Telephone Encounter (Signed)
Nurse called times 2 to get appointment for client after breaking previous appointment due to work out of town. Admits he hasn't been taking his medication  Out of medications Appointment given for Friday 05-17-17 @ 11:30 am . Nurse to notify client and counsel

## 2017-05-16 NOTE — Congregational Nurse Program (Signed)
Congregational Nurse Program Note  Date of Encounter: 04/26/2017  Past Medical History: Past Medical History:  Diagnosis Date  . Anginal pain (HCC)   . Bipolar disorder (HCC)   . Depression   . ETOH abuse   . H/O cocaine abuse   . Hypercholesterolemia   . Hypertension   . PAF (paroxysmal atrial fibrillation) (HCC)    [name]/notes 12/21/2014  . Stroke [facility](HCC) 2004   pt sts about 12 years ago.   . Tobacco abuse   . Type II diabetes mellitus (HCC) dx'd 12/21/2014    Encounter Details: CNP Questionnaire - 05/15/17 2249      Questionnaire   Patient Status  Not Applicable    Race  Black or African American    Location Patient Served At  Not Applicable    Insurance  Not Applicable    Uninsured  Uninsured (Subsequent visits/quarter)    Food  Yes, have food insecurities    Housing/Utilities  No permanent housing    Transportation  Yes, need transportation assistance    Interpersonal Safety  Yes, feel physically and emotionally safe where you currently live    Medication  Yes, have medication insecurities    Medical Provider  Yes    Referrals  Primary Care Provider/Clinic    ED Visit Averted  Yes    Life-Saving Intervention Made  Not Applicable      BP check. Client states he is out of medication but missed his last PCP appointment. States that he has a disability physical on 11/15 and needs bus passes. CN counseled client to keep medical appointments so access to care doesn't close. Client will follow up in CN clinic as needed.

## 2017-05-17 ENCOUNTER — Ambulatory Visit (INDEPENDENT_AMBULATORY_CARE_PROVIDER_SITE_OTHER): Payer: Self-pay | Admitting: Physician Assistant

## 2017-05-22 NOTE — Congregational Nurse Program (Signed)
Congregational Nurse Program Note  Date of Encounter: 05/21/2017  Past Medical History: Past Medical History:  Diagnosis Date  . Anginal pain (HCC)   . Bipolar disorder (HCC)   . Depression   . ETOH abuse   . H/O cocaine abuse   . Hypercholesterolemia   . Hypertension   . PAF (paroxysmal atrial fibrillation) (HCC)    [name]/notes 12/21/2014  . Stroke [facility](HCC) 2004   pt sts about 12 years ago.   . Tobacco abuse   . Type II diabetes mellitus (HCC) dx'd 12/21/2014    Encounter Details: CNP Questionnaire - 05/22/17 1733      Questionnaire   Patient Status  Not Applicable    Race  Black or African American    Location Patient Served At  Not Applicable    Insurance  Not Applicable    Uninsured  Uninsured (Subsequent visits/quarter)    Food  No food insecurities    Housing/Utilities  No permanent housing    Transportation  Yes, need transportation assistance    Interpersonal Safety  Yes, feel physically and emotionally safe where you currently live    Medication  Yes, have medication insecurities    Medical Provider  Yes    Referrals  Primary Care Provider/Clinic    ED Visit Averted  Not Applicable    Life-Saving Intervention Made  Not Applicable      Client in hallway spoke with me ask if he kept his appointment on 05-17-17 he replied yes but did not come in to have it  Checked or see the nurse . Will review record to see if appointment kept. Blood pressure was really high client was out of his medication

## 2017-05-28 ENCOUNTER — Encounter: Payer: Self-pay | Admitting: Pediatric Intensive Care

## 2017-05-28 NOTE — Congregational Nurse Program (Signed)
Congregational Nurse Program Note  Date of Encounter: 05/28/2017  Past Medical History: Past Medical History:  Diagnosis Date  . Anginal pain (HCC)   . Bipolar disorder (HCC)   . Depression   . ETOH abuse   . H/O cocaine abuse   . Hypercholesterolemia   . Hypertension   . PAF (paroxysmal atrial fibrillation) (HCC)    [name]/notes 12/21/2014  . Stroke [facility](HCC) 2004   pt sts about 12 years ago.   . Tobacco abuse   . Type II diabetes mellitus (HCC) dx'd 12/21/2014    Encounter Details: CNP Questionnaire - 05/28/17 2347      Questionnaire   Patient Status  Not Applicable    Race  Black or African American    Location Patient Served At  Not Applicable    Insurance  Not Applicable    Uninsured  Uninsured (Subsequent visits/quarter)    Food  Yes, have food insecurities    Housing/Utilities  No permanent housing    Interpersonal Safety  Yes, feel physically and emotionally safe where you currently live    Medication  Yes, have medication insecurities    Medical Provider  Yes    Referrals  Primary Care Provider/Clinic;[facility] Connects;Medication Assistance    ED Visit Averted  Not Applicable    Life-Saving Intervention Made  Not Applicable      Client states he went to his PCP appointment and was told he needed to pay $20 to be seen ,client had no money and left . Client out of his blood pressure medications ,nurse will call to check ou situation,out of the office today due to weather ,will try tomorrow ,will call pharmacy also to check on any refills . Client will be in trouble soon as he is out of his blood pressure medication ,blood pressure already high again as we had gotten it down fairly stable ,off medication back up.  Will monitor and see what can be done to reschedule.

## 2017-05-29 ENCOUNTER — Telehealth: Payer: Self-pay

## 2017-05-29 NOTE — Telephone Encounter (Signed)
Nurse had left a message regarding call back in reference to client appointment 0n 05-17-17,that client kept but was told he had to pay $20 dollars to be seen. Client was correct as he has broken 2 or more appointments and to be seen he must pay the $10 0r $20 dollars as that is the policy of the clinic. Nurse tried to plea clients case as his blood pressure continues to elevate without his ,medication but client must pay to be seen . [facility] states client did work some so he should be able to pay the fee but client is not planning to pay to be seen . Will monitor B/P and call pharmacy to see if client has any more refills on medication and encourage client to pay fee so his health can be protected.

## 2017-05-31 ENCOUNTER — Encounter: Payer: Self-pay | Admitting: Pediatric Intensive Care

## 2017-06-03 ENCOUNTER — Encounter: Payer: Self-pay | Admitting: Pediatric Intensive Care

## 2017-06-03 NOTE — Congregational Nurse Program (Signed)
Congregational Nurse Program Note  Date of Encounter: 05/28/2017  Past Medical History: Past Medical History:  Diagnosis Date  . Anginal pain (HCC)   . Bipolar disorder (HCC)   . Depression   . ETOH abuse   . H/O cocaine abuse   . Hypercholesterolemia   . Hypertension   . PAF (paroxysmal atrial fibrillation) (HCC)    [name]/notes 12/21/2014  . Stroke [facility](HCC) 2004   pt sts about 12 years ago.   . Tobacco abuse   . Type II diabetes mellitus (HCC) dx'd 12/21/2014    Encounter Details: CNP Questionnaire - 05/28/17 2354      Questionnaire   Patient Status  Not Applicable    Race  Black or African American    Location Patient Served At  Not Applicable    Insurance  Not Applicable    Uninsured  Uninsured (Subsequent visits/quarter)    Food  Yes, have food insecurities    Housing/Utilities  No permanent housing    Transportation  Yes, need transportation assistance    Interpersonal Safety  Yes, feel physically and emotionally safe where you currently live    Medication  Yes, have medication insecurities;Provided medication assistance    Medical Provider  Yes    Referrals  Primary Care Provider/Clinic;[facility]    ED Visit Averted  Not Applicable    Life-Saving Intervention Made  Not Applicable     Client states that he missed several appointments at [facility] and he can't go back until he pays the missed appointment fee. States that he is out of all medication, including BH medication. States that he's been feeling very angry. Client states that he gets his BH meds through [facility]. CN advised client to call [facility] to see if he has refills but that is his responsibility. CN states that she will call clinic to see if she can intervene.

## 2017-06-03 NOTE — Congregational Nurse Program (Signed)
Congregational Nurse Program Note  Date of Encounter: 05/31/2017  Past Medical History: Past Medical History:  Diagnosis Date  . Anginal pain (HCC)   . Bipolar disorder (HCC)   . Depression   . ETOH abuse   . H/O cocaine abuse   . Hypercholesterolemia   . Hypertension   . PAF (paroxysmal atrial fibrillation) (HCC)    [name]/notes 12/21/2014  . Stroke [facility](HCC) 2004   pt sts about 12 years ago.   . Tobacco abuse   . Type II diabetes mellitus (HCC) dx'd 12/21/2014    Encounter Details: CNP Questionnaire - 05/31/17 1030      Questionnaire   Patient Status  Not Applicable    Race  Black or African American    Location Patient Served At  [facility]    Insurance  Not Applicable    Uninsured  Uninsured (Subsequent visits/quarter)    Food  Yes, have food insecurities    Housing/Utilities  No permanent housing    Transportation  Yes, need transportation assistance    Interpersonal Safety  Yes, feel physically and emotionally safe where you currently live    Medication  Yes, have medication insecurities    Medical Provider  Yes    Referrals  Primary Care Provider/Clinic    ED Visit Averted  Not Applicable    Life-Saving Intervention Made  Not Applicable     BP check.

## 2017-06-03 NOTE — Congregational Nurse Program (Signed)
Congregational Nurse Program Note  Date of Encounter: 06/03/2017  Past Medical History: Past Medical History:  Diagnosis Date  . Anginal pain (HCC)   . Bipolar disorder (HCC)   . Depression   . ETOH abuse   . H/O cocaine abuse   . Hypercholesterolemia   . Hypertension   . PAF (paroxysmal atrial fibrillation) (HCC)    [name]/notes 12/21/2014  . Stroke [facility](HCC) 2004   pt sts about 12 years ago.   . Tobacco abuse   . Type II diabetes mellitus (HCC) dx'd 12/21/2014    Encounter Details: CNP Questionnaire - 06/03/17 0945      Questionnaire   Patient Status  Not Applicable    Race  Black or African American    Location [facility] Served At  [facility]    Insurance  Not Applicable    Uninsured  Uninsured (Subsequent visits/quarter)    Food  Yes, have food insecurities    Housing/Utilities  No permanent housing    Transportation  Yes, need transportation assistance    Interpersonal Safety  Yes, feel physically and emotionally safe where you currently live    Medication  Yes, have medication insecurities    Medical Provider  Yes    Referrals  Primary Care Provider/Clinic    ED Visit Averted  Not Applicable    Life-Saving Intervention Made  Not Applicable     BP check. Reviewed note from CN Feliberto HartsWanda Martin regarding [facility]. CN called clinic and left message to return call to reschedule client if at all possible. Client understands that he may not be able to return to clinic if he can't pay missed appointment fee.

## 2017-06-04 NOTE — Congregational Nurse Program (Signed)
Congregational Nurse Program Note  Date of Encounter: 06/04/2017  Past Medical History: Past Medical History:  Diagnosis Date  . Anginal pain (HCC)   . Bipolar disorder (HCC)   . Depression   . ETOH abuse   . H/O cocaine abuse   . Hypercholesterolemia   . Hypertension   . PAF (paroxysmal atrial fibrillation) (HCC)    [name]/notes 12/21/2014  . Stroke [facility](HCC) 2004   pt sts about 12 years ago.   . Tobacco abuse   . Type II diabetes mellitus (HCC) dx'd 12/21/2014    Encounter Details: CNP Questionnaire - 06/04/17 1753      Questionnaire   Patient Status  Not Applicable    Race  Black or African American    Location Patient Served At  Not Applicable    Insurance  Not Applicable    Uninsured  Uninsured (Subsequent visits/quarter)    Food  Yes, have food insecurities    Housing/Utilities  No permanent housing    Transportation  Yes, need transportation assistance    Interpersonal Safety  Yes, feel physically and emotionally safe where you currently live    Medication  Yes, have medication insecurities    Medical Provider  Yes    Referrals  Primary Care Provider/Clinic    ED Visit Averted  Not Applicable    Life-Saving Intervention Made  Not Applicable     Client states he isn't working at all right now ,doesn't have the money to pay to be seen by provider ,needs his medications ,requesting bus ticket not given  Until client has an appointment Monitor B/P

## 2017-06-05 MED FILL — LISINOPRIL 40 MG TAB: 40 | 30 days supply | Qty: 30 | Fill #1

## 2017-06-05 NOTE — Congregational Nurse Program (Signed)
Congregational Nurse Program Note  Date of Encounter: 06/05/2017  Past Medical History: Past Medical History:  Diagnosis Date  . Anginal pain (HCC)   . Bipolar disorder (HCC)   . Depression   . ETOH abuse   . H/O cocaine abuse   . Hypercholesterolemia   . Hypertension   . PAF (paroxysmal atrial fibrillation) (HCC)    [name]/notes 12/21/2014  . Stroke [facility](HCC) 2004   pt sts about 12 years ago.   . Tobacco abuse   . Type II diabetes mellitus (HCC) dx'd 12/21/2014    Encounter Details: CNP Questionnaire - 06/05/17 1857      Questionnaire   Patient Status  Not Applicable    Race  Black or African American    Location Patient Served At  Not Applicable    Insurance  Not Applicable    Uninsured  Uninsured (Subsequent visits/quarter)    Food  Yes, have food insecurities    Housing/Utilities  No permanent housing    Transportation  Provided transportation assistance (bus pass, taxi voucher, etc.);Yes, need transportation assistance    Interpersonal Safety  Yes, feel physically and emotionally safe where you currently live    Medication  Yes, have medication insecurities;Provided medication assistance    Medical Provider  Yes    Referrals  Primary Care Provider/Clinic    ED Visit Averted  Not Applicable    Life-Saving Intervention Made  Not Applicable      Nurse called pharmacy has refills on his Lisinopril and can pick it up on Friday . Alerted client he can pick it up. Client in today and appears to have been drinking ,eyes blood shot ,talking a lot . After talking to client I feel he has been drinking not only today but yesterday . Counseled regarding his blood pressure and the effects on his kidneys ,education on high blood given but client seems to feel he can handle his situation.Given 2 bus tickets to pick [facility] his medication [facility]. Reminded client to make his appointment and that he needs to pay fee .

## 2017-06-08 ENCOUNTER — Encounter (HOSPITAL_COMMUNITY): Payer: Self-pay | Admitting: Family Medicine

## 2017-06-08 ENCOUNTER — Inpatient Hospital Stay (HOSPITAL_COMMUNITY)
Admission: EM | Admit: 2017-06-08 | Discharge: 2017-06-11 | DRG: 309 | Disposition: A | Discharge status: Home / Self Care | Payer: Self-pay | Attending: Internal Medicine | Admitting: Internal Medicine

## 2017-06-08 ENCOUNTER — Emergency Department (HOSPITAL_COMMUNITY): Payer: Self-pay

## 2017-06-08 ENCOUNTER — Other Ambulatory Visit: Payer: Self-pay

## 2017-06-08 DIAGNOSIS — F3163 Bipolar disorder, current episode mixed, severe, without psychotic features: Secondary | ICD-10-CM | POA: Diagnosis present

## 2017-06-08 DIAGNOSIS — R0789 Other chest pain: Secondary | ICD-10-CM | POA: Diagnosis present

## 2017-06-08 DIAGNOSIS — E119 Type 2 diabetes mellitus without complications: Secondary | ICD-10-CM

## 2017-06-08 DIAGNOSIS — I2510 Atherosclerotic heart disease of native coronary artery without angina pectoris: Secondary | ICD-10-CM | POA: Diagnosis present

## 2017-06-08 DIAGNOSIS — I480 Paroxysmal atrial fibrillation: Principal | ICD-10-CM | POA: Diagnosis present

## 2017-06-08 DIAGNOSIS — Z79899 Other long term (current) drug therapy: Secondary | ICD-10-CM

## 2017-06-08 DIAGNOSIS — Z833 Family history of diabetes mellitus: Secondary | ICD-10-CM

## 2017-06-08 DIAGNOSIS — Z760 Encounter for issue of repeat prescription: Secondary | ICD-10-CM

## 2017-06-08 DIAGNOSIS — F1410 Cocaine abuse, uncomplicated: Secondary | ICD-10-CM | POA: Diagnosis present

## 2017-06-08 DIAGNOSIS — R079 Chest pain, unspecified: Secondary | ICD-10-CM | POA: Diagnosis present

## 2017-06-08 DIAGNOSIS — I160 Hypertensive urgency: Secondary | ICD-10-CM | POA: Diagnosis present

## 2017-06-08 DIAGNOSIS — Z8673 Personal history of transient ischemic attack (TIA), and cerebral infarction without residual deficits: Secondary | ICD-10-CM

## 2017-06-08 DIAGNOSIS — F1910 Other psychoactive substance abuse, uncomplicated: Secondary | ICD-10-CM | POA: Diagnosis present

## 2017-06-08 DIAGNOSIS — F17210 Nicotine dependence, cigarettes, uncomplicated: Secondary | ICD-10-CM | POA: Diagnosis present

## 2017-06-08 DIAGNOSIS — Z7901 Long term (current) use of anticoagulants: Secondary | ICD-10-CM

## 2017-06-08 DIAGNOSIS — F1020 Alcohol dependence, uncomplicated: Secondary | ICD-10-CM | POA: Diagnosis present

## 2017-06-08 DIAGNOSIS — I4891 Unspecified atrial fibrillation: Secondary | ICD-10-CM

## 2017-06-08 DIAGNOSIS — I2583 Coronary atherosclerosis due to lipid rich plaque: Secondary | ICD-10-CM

## 2017-06-08 DIAGNOSIS — R7303 Prediabetes: Secondary | ICD-10-CM

## 2017-06-08 DIAGNOSIS — F1290 Cannabis use, unspecified, uncomplicated: Secondary | ICD-10-CM | POA: Diagnosis present

## 2017-06-08 DIAGNOSIS — Z538 Procedure and treatment not carried out for other reasons: Secondary | ICD-10-CM | POA: Diagnosis not present

## 2017-06-08 DIAGNOSIS — I10 Essential (primary) hypertension: Secondary | ICD-10-CM | POA: Diagnosis present

## 2017-06-08 DIAGNOSIS — Z9114 Patient's other noncompliance with medication regimen: Secondary | ICD-10-CM

## 2017-06-08 DIAGNOSIS — D72829 Elevated white blood cell count, unspecified: Secondary | ICD-10-CM | POA: Diagnosis present

## 2017-06-08 DIAGNOSIS — Z6836 Body mass index (BMI) 36.0-36.9, adult: Secondary | ICD-10-CM

## 2017-06-08 DIAGNOSIS — Z7984 Long term (current) use of oral hypoglycemic drugs: Secondary | ICD-10-CM

## 2017-06-08 DIAGNOSIS — E669 Obesity, unspecified: Secondary | ICD-10-CM | POA: Diagnosis present

## 2017-06-08 LAB — BASIC METABOLIC PANEL
ANION GAP: 11 (ref 5–15)
BUN: 9 mg/dL (ref 6–20)
CALCIUM: 8.6 mg/dL — AB (ref 8.9–10.3)
CHLORIDE: 105 mmol/L (ref 101–111)
CO2: 23 mmol/L (ref 22–32)
CREATININE: 0.8 mg/dL (ref 0.61–1.24)
GFR calc non Af Amer: >60 mL/min (ref >60)
Glucose, Bld: 99 mg/dL (ref 65–99)
Potassium: 5 mmol/L (ref 3.5–5.1)
SODIUM: 139 mmol/L (ref 135–145)

## 2017-06-08 LAB — CBC
HEMATOCRIT: 51.2 % (ref 39.0–52.0)
Hemoglobin: 17.1 g/dL — ABNORMAL HIGH (ref 13.0–17.0)
MCH: 31.3 pg (ref 26.0–34.0)
MCHC: 33.4 g/dL (ref 30.0–36.0)
MCV: 93.8 fL (ref 78.0–100.0)
PLATELETS: 333 10*3/uL (ref 150–400)
RBC: 5.46 MIL/uL (ref 4.22–5.81)
RDW: 14.8 % (ref 11.5–15.5)
WBC: 14 10*3/uL — ABNORMAL HIGH (ref 4.0–10.5)

## 2017-06-08 LAB — TROPONIN I: TROPONIN I: 0.03 ng/mL — AB (ref <0.03)

## 2017-06-08 LAB — MAGNESIUM: MAGNESIUM: 2.2 mg/dL (ref 1.7–2.4)

## 2017-06-08 LAB — I-STAT TROPONIN, ED: Troponin i, poc: 0.03 ng/mL (ref 0.00–0.08)

## 2017-06-08 MED ORDER — FOLIC ACID 1 MG PO TABS
1.0000 mg | ORAL_TABLET | Freq: Every day | ORAL | Status: DC
  Administered 2017-06-09 – 2017-06-11 (×2): 1 mg via ORAL
  Filled 2017-06-08 (×2): qty 1

## 2017-06-08 MED ORDER — VITAMIN B-1 100 MG PO TABS
100.0000 mg | ORAL_TABLET | Freq: Every day | ORAL | Status: DC
  Administered 2017-06-09 – 2017-06-11 (×2): 100 mg via ORAL
  Filled 2017-06-08 (×2): qty 1

## 2017-06-08 MED ORDER — DILTIAZEM HCL-DEXTROSE 100-5 MG/100ML-% IV SOLN (PREMIX)
5.0000 mg/h | INTRAVENOUS | Status: DC
  Administered 2017-06-08: 5 mg/h via INTRAVENOUS
  Administered 2017-06-09: 15 mg/h via INTRAVENOUS
  Administered 2017-06-09: 10 mg/h via INTRAVENOUS
  Administered 2017-06-09 – 2017-06-10 (×3): 15 mg/h via INTRAVENOUS
  Filled 2017-06-08 (×8): qty 100

## 2017-06-08 MED ORDER — NITROGLYCERIN 0.4 MG SL SUBL
0.4000 mg | SUBLINGUAL_TABLET | SUBLINGUAL | Status: DC | PRN
  Filled 2017-06-08: qty 25

## 2017-06-08 MED ORDER — RISPERIDONE 1 MG PO TABS
1.0000 mg | ORAL_TABLET | Freq: Two times a day (BID) | ORAL | Status: DC
  Administered 2017-06-08 – 2017-06-11 (×6): 1 mg via ORAL
  Filled 2017-06-08 (×6): qty 1

## 2017-06-08 MED ORDER — METOPROLOL TARTRATE 25 MG PO TABS: 25.0000 mg | ORAL_TABLET | Freq: Two times a day (BID) | ORAL | Status: DC

## 2017-06-08 MED ORDER — AMLODIPINE BESYLATE 10 MG PO TABS
10.0000 mg | ORAL_TABLET | Freq: Every day | ORAL | Status: DC
  Administered 2017-06-09 – 2017-06-11 (×3): 10 mg via ORAL
  Filled 2017-06-08 (×2): qty 2
  Filled 2017-06-08: qty 1

## 2017-06-08 MED ORDER — LORAZEPAM 2 MG/ML IJ SOLN
2.0000 mg | INTRAMUSCULAR | Status: DC | PRN
  Filled 2017-06-08: qty 1

## 2017-06-08 MED ORDER — SODIUM CHLORIDE 0.9 % IV SOLN: Freq: Once | INTRAVENOUS | Status: DC

## 2017-06-08 MED ORDER — OXCARBAZEPINE 300 MG PO TABS
300.0000 mg | ORAL_TABLET | Freq: Two times a day (BID) | ORAL | Status: DC
  Administered 2017-06-08 – 2017-06-11 (×6): 300 mg via ORAL
  Filled 2017-06-08 (×6): qty 1

## 2017-06-08 MED ORDER — APIXABAN 5 MG PO TABS
5.0000 mg | ORAL_TABLET | Freq: Two times a day (BID) | ORAL | Status: DC
  Administered 2017-06-08 – 2017-06-11 (×6): 5 mg via ORAL
  Filled 2017-06-08 (×6): qty 1

## 2017-06-08 MED ORDER — ADULT MULTIVITAMIN W/MINERALS CH
1.0000 | ORAL_TABLET | Freq: Every day | ORAL | Status: DC
  Administered 2017-06-09 – 2017-06-11 (×2): 1 via ORAL
  Filled 2017-06-08 (×2): qty 1

## 2017-06-08 MED ORDER — DILTIAZEM LOAD VIA INFUSION
15.0000 mg | Freq: Once | INTRAVENOUS | Status: AC
  Administered 2017-06-08: 15 mg via INTRAVENOUS
  Filled 2017-06-08: qty 15

## 2017-06-08 MED ORDER — ONDANSETRON HCL 4 MG/2ML IJ SOLN: 4.0000 mg | Freq: Four times a day (QID) | INTRAMUSCULAR | Status: DC | PRN

## 2017-06-08 MED ORDER — NICOTINE 21 MG/24HR TD PT24: 21.0000 mg | MEDICATED_PATCH | Freq: Every day | TRANSDERMAL | Status: DC

## 2017-06-08 MED ORDER — LISINOPRIL 40 MG PO TABS
40.0000 mg | ORAL_TABLET | Freq: Every day | ORAL | Status: DC
  Administered 2017-06-09 – 2017-06-11 (×3): 40 mg via ORAL
  Filled 2017-06-08: qty 2
  Filled 2017-06-08 (×3): qty 1

## 2017-06-08 MED ORDER — HYDROCHLOROTHIAZIDE 25 MG PO TABS
25.0000 mg | ORAL_TABLET | Freq: Every day | ORAL | Status: DC
  Administered 2017-06-09 – 2017-06-11 (×3): 25 mg via ORAL
  Filled 2017-06-08 (×3): qty 1

## 2017-06-08 MED ORDER — ACETAMINOPHEN 325 MG PO TABS: 650.0000 mg | ORAL_TABLET | ORAL | Status: DC | PRN

## 2017-06-08 NOTE — ED Provider Notes (Signed)
[facility] EMERGENCY DEPARTMENT Provider Note   CSN: [id number] Arrival date & time: 06/08/17  1710     History   Chief Complaint Chief Complaint  Patient presents with  . Chest Pain    HPI Gary Buck is a 51 y.o. male.  Patient with history of cocaine and alcohol abuse, atrial fibrillation paroxysmal, compliant with elaquis, small heart attack without procedure per His report presents with recurrent chest pressure today and feeling heart racing at around 3:30 o'clock. This does feel similar to previous.  Patient denies fevers or chills, mild cough. Patient has not taken metoprolol in 2 weeks.      Past Medical History:  Diagnosis Date  . Anginal pain (HCC)   . Bipolar disorder (HCC)   . Depression   . ETOH abuse   . H/O cocaine abuse   . Hypercholesterolemia   . Hypertension   . PAF (paroxysmal atrial fibrillation) (HCC)    [name] 12/21/2014  . Stroke [facility]) 2004   pt sts about 12 years ago.   . Tobacco abuse   . Type II diabetes mellitus (HCC) dx'd 12/21/2014    Patient Active Problem List   Diagnosis Date Noted  . Uncontrolled hypertension 11/18/2016  . Obesity (BMI 30-39.9) 11/18/2016  . PAF (paroxysmal atrial fibrillation) (HCC) 11/17/2016  . Hypercholesterolemia 11/17/2016  . Type II diabetes mellitus (HCC) 11/17/2016  . Bipolar disorder (HCC) 11/17/2016  . ETOH abuse 11/17/2016  . Tobacco abuse 11/17/2016  . Bipolar affective disorder, mixed, severe (HCC) 06/28/2016  . Bipolar affective disorder, current episode mixed, without psychotic features (HCC) 06/28/2016  . Atrial fibrillation, unspecified   . Elevated troponin   . Atrial fibrillation with RVR (HCC) 12/21/2014  . CAD (coronary artery disease) 12/21/2014  . Chest pain   . Essential hypertension   . Remote history of stroke   . Polysubstance abuse [facility])     Past Surgical History:  Procedure Laterality Date  . CYSTECTOMY  ~ 2004   "on the top of my forehead"  .  INGUINAL HERNIA REPAIR Left ~ 2012       Home Medications    Prior to Admission medications   Medication Sig Start Date End Date Taking? Authorizing Provider  amLODipine (NORVASC) 10 MG tablet Take 1 tablet (10 mg total) by mouth daily. 03/21/17   Loletta Specter, PA-C  apixaban (ELIQUIS) 5 MG TABS tablet Take 1 tablet (5 mg total) by mouth 2 (two) times daily. 04/09/17   Quentin Angst, MD  hydrochlorothiazide (HYDRODIURIL) 25 MG tablet Take 1 tablet (25 mg total) by mouth daily. Take on tablet in the morning. 03/21/17   Loletta Specter, PA-C  lisinopril (PRINIVIL,ZESTRIL) 40 MG tablet Take 1 tablet (40 mg total) by mouth daily. 03/21/17   Loletta Specter, PA-C  metFORMIN (GLUCOPHAGE) 500 MG tablet Take 1 tablet (500 mg total) by mouth 2 (two) times daily with a meal. 03/21/17   Loletta Specter, PA-C  metoprolol tartrate (LOPRESSOR) 25 MG tablet Take 1 tablet (25 mg total) by mouth 2 (two) times daily. 03/21/17   Loletta Specter, PA-C  nicotine (NICODERM CQ - DOSED IN MG/24 HOURS) 21 mg/24hr patch Place 1 patch (21 mg total) onto the skin daily. 11/18/16   Dhungel, Nishant, MD  risperiDONE (RISPERDAL) 1 MG tablet Take 1 mg by mouth 2 (two) times daily. 11/26/16   [provider]  TRILEPTAL 300 MG tablet Take 300 mg by mouth 2 (two) times daily. 11/26/16  [provider]    Family History Family History  Problem Relation Age of Onset  . Diabetes Mellitus II Mother     Social History Social History   Tobacco Use  . Smoking status: Current Every Day Smoker    Packs/day: 0.50    Years: 32.00    Pack years: 16.00    Types: Cigarettes  . Smokeless tobacco: Never Used  Substance Use Topics  . Alcohol use: Yes    Alcohol/week: 32.4 oz    Types: 54 Cans of beer per week    Comment: 12/21/2014 "2-3, 24oz  beers per night during work nights, 6 24oz beer per day on the weekends"  . Drug use: Yes    Types: Marijuana    Comment: 12/21/2014 "last cocaine was ~  2 months ago"     Allergies   Patient has no known allergies.   Review of Systems Review of Systems  Constitutional: Negative for chills and fever.  HENT: Negative for congestion.   Eyes: Negative for visual disturbance.  Respiratory: Positive for shortness of breath.   Cardiovascular: Positive for chest pain and palpitations. Negative for leg swelling.  Gastrointestinal: Negative for abdominal pain and vomiting.  Genitourinary: Negative for dysuria and flank pain.  Musculoskeletal: Negative for back pain, neck pain and neck stiffness.  Skin: Negative for rash.  Neurological: Negative for light-headedness and headaches.     Physical Exam Updated Vital Signs BP (!) 142/86   Pulse (!) 116   Temp 99.5 F (37.5 C) (Oral)   Resp (!) 22   Ht 6\' 5"  (1.956 m)   Wt (!) 145.2 kg (320 lb)   SpO2 98%   BMI 37.95 kg/m   Physical Exam  Constitutional: He is oriented to person, place, and time. He appears well-developed and well-nourished.  HENT:  Head: Normocephalic and atraumatic.  Eyes: Conjunctivae are normal. Right eye exhibits no discharge. Left eye exhibits no discharge.  Neck: Normal range of motion. Neck supple. No tracheal deviation present.  Cardiovascular: An irregularly irregular rhythm present. Tachycardia present.  Pulmonary/Chest: Effort normal and breath sounds normal.  Abdominal: Soft. He exhibits no distension. There is no tenderness. There is no guarding.  Musculoskeletal: He exhibits no edema.  Neurological: He is alert and oriented to person, place, and time.  Skin: Skin is warm. No rash noted.  Psychiatric: He has a normal mood and affect.  Nursing note and vitals reviewed.    ED Treatments / Results  Labs (all labs ordered are listed, but only abnormal results are displayed) Labs Reviewed  BASIC METABOLIC PANEL - Abnormal; Notable for the following components:      Result Value   Calcium 8.6 (*)    All other components within normal limits  CBC -  Abnormal; Notable for the following components:   WBC 14.0 (*)    Hemoglobin 17.1 (*)    All other components within normal limits  URINALYSIS, ROUTINE W REFLEX MICROSCOPIC  RAPID URINE DRUG SCREEN, HOSP PERFORMED  TROPONIN I  TROPONIN I  TROPONIN I  MAGNESIUM  BASIC METABOLIC PANEL  CBC  I-STAT TROPONIN, ED    EKG  EKG Interpretation  Date/Time:  Saturday June 08 2017 17:17:09 EST Ventricular Rate:  127 PR Interval:    QRS Duration: 91 QT Interval:  343 QTC Calculation: 499 R Axis:   -12 Text Interpretation:  Atrial fibrillation Ventricular premature complex Consider left ventricular hypertrophy ST elevation, consider anterolateral injury Borderline prolonged QT interval ST changes overall similar  to previous Confirmed by Blane Ohara [phone]) on 06/08/2017 5:40:21 PM       Radiology Dg Chest 2 View  Result Date: 06/08/2017 CLINICAL DATA:  Mild right-sided chest pain with deep breath and weakness. EXAM: CHEST  2 VIEW COMPARISON:  02/26/2017 FINDINGS: Lungs are adequately inflated without focal airspace consolidation or effusion. Cardiomediastinal silhouette and remainder of the exam is unchanged. IMPRESSION: No active cardiopulmonary disease. Electronically Signed   By: Elberta Fortis M.D.   On: 06/08/2017 18:01    Procedures .Critical Care Performed by: Blane Ohara, MD Authorized by: Blane Ohara, MD   Critical care provider statement:    Critical care time (minutes):  35   Critical care start time:  06/08/2017 6:45 PM   Critical care end time:  06/08/2017 7:20 PM   Critical care time was exclusive of:  Separately billable procedures and treating other patients and teaching time   Critical care was necessary to treat or prevent imminent or life-threatening deterioration of the following conditions:  Cardiac failure   Critical care was time spent personally by me on the following activities:  Ordering and review of laboratory studies, re-evaluation of  patient's condition, evaluation of patient's response to treatment and discussions with consultants   I assumed direction of critical care for this patient from another provider in my specialty: no     (including critical care time)  Medications Ordered in ED Medications  diltiazem (CARDIZEM) 1 mg/mL load via infusion 15 mg (not administered)    And  diltiazem (CARDIZEM) 100 mg in dextrose 5% (1 mg/mL) infusion (not administered)  amLODipine (NORVASC) tablet 10 mg (not administered)  apixaban (ELIQUIS) tablet 5 mg (not administered)  hydrochlorothiazide (HYDRODIURIL) tablet 25 mg (not administered)  lisinopril (PRINIVIL,ZESTRIL) tablet 40 mg (not administered)  metoprolol tartrate (LOPRESSOR) tablet 25 mg (not administered)  nicotine (NICODERM CQ - dosed in mg/24 hours) patch 21 mg (not administered)  risperiDONE (RISPERDAL) tablet 1 mg (not administered)  Oxcarbazepine (TRILEPTAL) tablet 300 mg (not administered)  nitroGLYCERIN (NITROSTAT) SL tablet 0.4 mg (not administered)  acetaminophen (TYLENOL) tablet 650 mg (not administered)  ondansetron (ZOFRAN) injection 4 mg (not administered)  LORazepam (ATIVAN) injection 2-3 mg (not administered)  folic acid (FOLVITE) tablet 1 mg (not administered)  thiamine (VITAMIN B-1) tablet 100 mg (not administered)  multivitamin with minerals tablet 1 tablet (not administered)     Initial Impression / Assessment and Plan / ED Course  I have reviewed the triage vital signs and the nursing notes.  Pertinent labs & imaging results that were available during my care of the patient were reviewed by me and considered in my medical decision making (see chart for details).    Patient presents with worsening chest pressure and atrial fibrillation and rapid ventricular response. With history and risk factors plan for admission. Concern for compliance. Discussed with cardiologist for consult and discuss with hospitalist for admission. Diltiazem bolus  and drip ordered. Aspirin ordered.  CHA2DS2/VAS Stroke Risk Points      3 >= 2 Points: High Risk  1 - 1.99 Points: Medium Risk  0 Points: Low Risk    The patient's score has not changed in the past year.:  No Change     Details    This score determines the patient's risk of having a stroke if the  patient has atrial fibrillation.       Points Metrics  0 Has Congestive Heart Failure:  No   1 Has Vascular Disease:  Yes  1 Has Hypertension:  Yes   0 Age:  [age]   1 Has Diabetes:  Yes   0 Had Stroke:  No  Had TIA:  No  Had thromboembolism:  No   0 Male:  No       The patients results and plan were reviewed and discussed.   Any x-rays performed were independently reviewed by myself.   Differential diagnosis were considered with the presenting HPI.  Medications  diltiazem (CARDIZEM) 1 mg/mL load via infusion 15 mg (not administered)    And  diltiazem (CARDIZEM) 100 mg in dextrose 5% 100mL (1 mg/mL) infusion (not administered)  amLODipine (NORVASC) tablet 10 mg (not administered)  apixaban (ELIQUIS) tablet 5 mg (not administered)  hydrochlorothiazide (HYDRODIURIL) tablet 25 mg (not administered)  lisinopril (PRINIVIL,ZESTRIL) tablet 40 mg (not administered)  metoprolol tartrate (LOPRESSOR) tablet 25 mg (not administered)  nicotine (NICODERM CQ - dosed in mg/24 hours) patch 21 mg (not administered)  risperiDONE (RISPERDAL) tablet 1 mg (not administered)  Oxcarbazepine (TRILEPTAL) tablet 300 mg (not administered)  nitroGLYCERIN (NITROSTAT) SL tablet 0.4 mg (not administered)  acetaminophen (TYLENOL) tablet 650 mg (not administered)  ondansetron (ZOFRAN) injection 4 mg (not administered)  LORazepam (ATIVAN) injection 2-3 mg (not administered)  folic acid (FOLVITE) tablet 1 mg (not administered)  thiamine (VITAMIN B-1) tablet 100 mg (not administered)  multivitamin with minerals tablet 1 tablet (not administered)    Vitals:   06/08/17 1730 06/08/17 1745 06/08/17 1800 06/08/17  1900  BP: (!) 146/87 (!) 149/108 (!) 141/100 (!) 142/86  Pulse: (!) 110 (!) 127 (!) 111 (!) 116  Resp: (!) 28 16 (!) 28 (!) 22  Temp:      TempSrc:      SpO2: 98% 98% 100% 98%  Weight:      Height:        Final diagnoses:  Atrial fibrillation with RVR (HCC)  Acute chest pain    Admission/ observation were discussed with the admitting physician, patient and/or family and they are comfortable with the plan.         Final Clinical Impressions(s) / ED Diagnoses   Final diagnoses:  Atrial fibrillation with RVR (HCC)  Acute chest pain    ED Discharge Orders    None       Blane OharaZavitz, Marvie Calender, MD 06/08/17 1929

## 2017-06-08 NOTE — Consult Note (Signed)
CARDIOLOGY CONSULT NOTE   Referring Physician: Dr. Jodi Mourning Primary Cardiologist: Dr. Prentice Docker Reason for Consultation: Afib with RVR  HPI:  Patient is a 51 y/o M who comes in today complaining on increased palpitations, chest pressure, sob, fatigue that started earlier this evening. Symptoms did not resolve. Hence, patient came to the ER for further evaluation. On monitor, he was found to be back in Afib with HR's in the 150-160's. He was given one dose of IV lopressor 10mg  with minimal improvement in his HR's. He was started on a dilt gtt which brought this HR's down further into the 120's and patient was admitted for further treatment. Despite improvement in his HR's, he still complains of fatigue and lethargy, which tend to occur every time he is in Afib. He claims to be able to know every occurrence of Afib and has self converted both times he was in it, in the past. He added that he stopped his eliquis since Thursday. This was not attributed to any bleeding, but rather lack of convenience. He is opening to continuing it now that he understands the importance of the medication.  Pmhx: pAfib, HTN, HLP, Bipolar disorder, alcohol abuse, cocaine abuse (hx),  Review of Systems:     Cardiac Review of Systems: {x] = yes [ ]  = no  Chest Pain [ x   ]  Resting SOB [ x  ] Exertional SOB  [ x ]  Orthopnea [  ]   Pedal Edema [   ]    Palpitations [ x ] Syncope  [  ]   Presyncope [   ]  General Review of Systems: [Y] = yes [  ]=no Constitional: recent weight change [  ]; anorexia [  ]; fatigue [ x ]; nausea [  ]; night sweats [  ]; fever [  ]; or chills [  ];                                                                     Eyes : blurred vision [  ]; diplopia [   ]; vision changes [  ];  Amaurosis fugax[  ]; Resp: cough [ x ];  wheezing[  ];  hemoptysis[  ];  PND [  ];  GI:  gallstones[  ], vomiting[  ];  dysphagia[  ]; melena[  ];  hematochezia [  ]; heartburn[  ];   GU: kidney stones [   ]; hematuria[  ];   dysuria [  ];  nocturia[  ]; incontinence [  ];             Skin: rash, swelling[  ];, hair loss[  ];  peripheral edema[  ];  or itching[  ]; Musculosketetal: myalgias[  ];  joint swelling[  ];  joint erythema[  ];  joint pain[  ];  back pain[  ];  Heme/Lymph: bruising[  ];  bleeding[  ];  anemia[  ];  Neuro: TIA[  ];  headaches[  ];  stroke[  ];  vertigo[  ];  seizures[  ];   paresthesias[  ];  difficulty walking[  ];  Psych:depression[  ]; anxiety[  ];  Endocrine: diabetes[  ];  thyroid dysfunction[  ];  Other:  Past Medical History:  Diagnosis Date  . Anginal pain (HCC)   . Bipolar disorder (HCC)   . Depression   . ETOH abuse   . H/O cocaine abuse   . Hypercholesterolemia   . Hypertension   . PAF (paroxysmal atrial fibrillation) (HCC)    [name] 12/21/2014  . Stroke [facility]) 2004   pt sts about 12 years ago.   . Tobacco abuse   . Type II diabetes mellitus (HCC) dx'd 12/21/2014   Medications Prior to Admission  Medication Sig Dispense Refill  . amLODipine (NORVASC) 10 MG tablet Take 1 tablet (10 mg total) by mouth daily. 90 tablet 1  . apixaban (ELIQUIS) 5 MG TABS tablet Take 1 tablet (5 mg total) by mouth 2 (two) times daily. 180 tablet 3  . hydrochlorothiazide (HYDRODIURIL) 25 MG tablet Take 1 tablet (25 mg total) by mouth daily. Take on tablet in the morning. 90 tablet 1  . lisinopril (PRINIVIL,ZESTRIL) 40 MG tablet Take 1 tablet (40 mg total) by mouth daily. 90 tablet 1  . metFORMIN (GLUCOPHAGE) 500 MG tablet Take 1 tablet (500 mg total) by mouth 2 (two) times daily with a meal. 180 tablet 1  . metoprolol tartrate (LOPRESSOR) 25 MG tablet Take 1 tablet (25 mg total) by mouth 2 (two) times daily. 180 tablet 1  . risperiDONE (RISPERDAL) 1 MG tablet Take 1 mg by mouth 2 (two) times daily.  0  . TRILEPTAL 300 MG tablet Take 300 mg by mouth 2 (two) times daily.  0  . nicotine (NICODERM CQ - DOSED IN MG/24 HOURS) 21 mg/24hr patch Place 1 patch (21 mg total) onto the  skin daily. (Patient not taking: Reported on 06/08/2017) 28 patch 0   . [START ON 06/09/2017] amLODipine  10 mg Oral Daily  . apixaban  5 mg Oral BID  . [START ON 06/09/2017] folic acid  1 mg Oral Daily  . [START ON 06/09/2017] hydrochlorothiazide  25 mg Oral Daily  . [START ON 06/09/2017] lisinopril  40 mg Oral Daily  . [START ON 06/09/2017] metoprolol tartrate  25 mg Oral BID  . [START ON 06/09/2017] multivitamin with minerals  1 tablet Oral Daily  . Oxcarbazepine  300 mg Oral BID  . risperiDONE  1 mg Oral BID  . [START ON 06/09/2017] thiamine  100 mg Oral Daily   Infusions: . diltiazem (CARDIZEM) infusion 5 mg/hr (06/08/17 1929)   No Known Allergies  Social History   Socioeconomic History  . Marital status: Single    Spouse name: Not on file  . Number of children: Not on file  . Years of education: Not on file  . Highest education level: Not on file  Social Needs  . Financial resource strain: Not on file  . Food insecurity - worry: Not on file  . Food insecurity - inability: Not on file  . Transportation needs - medical: Not on file  . Transportation needs - non-medical: Not on file  Occupational History  . Not on file  Tobacco Use  . Smoking status: Current Every Day Smoker    Packs/day: 0.50    Years: 32.00    Pack years: 16.00    Types: Cigarettes  . Smokeless tobacco: Never Used  Substance and Sexual Activity  . Alcohol use: Yes    Alcohol/week: 32.4 oz    Types: 54 Cans of beer per week    Comment: 12/21/2014 "2-3, 24oz  beers per night during work nights, 6 24oz beer per day on the  weekends"  . Drug use: Yes    Types: Marijuana    Comment: 12/21/2014 "last cocaine was ~ 2 months ago"  . Sexual activity: Yes  Other Topics Concern  . Not on file  Social History Narrative  . Not on file   Family History  Problem Relation Age of Onset  . Diabetes Mellitus II Mother    PHYSICAL EXAM: Vitals:   06/08/17 2002 06/08/17 2042  BP:  (!) 138/99  Pulse: 84     Resp: 17 19  Temp:  98.6 F (37 C)  SpO2: 100% 98%   No intake or output data in the 24 hours ending 06/08/17 2157  General:  Well appearing. No respiratory difficulty HEENT: normal Neck: supple. no JVD. Carotids 2+ bilat; no bruits. No lymphadenopathy or thryomegaly appreciated. Cor: irregularly irregular, tachycardic Lungs: clear Abdomen: soft, nontender, nondistended. No hepatosplenomegaly. No bruits or masses. Good bowel sounds. Extremities: no cyanosis, clubbing, rash, edema Neuro: alert & oriented x 3, cranial nerves grossly intact. moves all 4 extremities w/o difficulty. Affect pleasant.  ECG:  Results for orders placed or performed during the hospital encounter of 06/08/17 (from the past 24 hour(s))  Basic metabolic panel     Status: Abnormal   Collection Time: 06/08/17  5:28 PM  Result Value Ref Range   Sodium 139 135 - 145 mmol/L   Potassium 5.0 3.5 - 5.1 mmol/L   Chloride 105 101 - 111 mmol/L   CO2 23 22 - 32 mmol/L   Glucose, Bld 99 65 - 99 mg/dL   BUN 9 6 - 20 mg/dL   Creatinine, Ser 4.090.80 0.61 - 1.24 mg/dL   Calcium 8.6 (L) 8.9 - 10.3 mg/dL   GFR calc non Af Amer >60 >60 mL/min   GFR calc Af Amer >60 >60 mL/min   Anion gap 11 5 - 15  CBC     Status: Abnormal   Collection Time: 06/08/17  5:28 PM  Result Value Ref Range   WBC 14.0 (H) 4.0 - 10.5 K/uL   RBC 5.46 4.22 - 5.81 MIL/uL   Hemoglobin 17.1 (H) 13.0 - 17.0 g/dL   HCT 81.151.2 91.439.0 - 78.252.0 %   MCV 93.8 78.0 - 100.0 fL   MCH 31.3 26.0 - 34.0 pg   MCHC 33.4 30.0 - 36.0 g/dL   RDW 95.614.8 21.311.5 - 08.615.5 %   Platelets 333 150 - 400 K/uL  I-Stat Troponin, ED (not at [facility])     Status: None   Collection Time: 06/08/17  5:37 PM  Result Value Ref Range   Troponin i, poc 0.03 0.00 - 0.08 ng/mL   Comment 3          Troponin I     Status: Abnormal   Collection Time: 06/08/17  7:25 PM  Result Value Ref Range   Troponin I 0.03 (HH) <0.03 ng/mL  Magnesium     Status: None   Collection Time: 06/08/17  7:25 PM  Result  Value Ref Range   Magnesium 2.2 1.7 - 2.4 mg/dL   Dg Chest 2 View  Result Date: 06/08/2017 CLINICAL DATA:  Mild right-sided chest pain with deep breath and weakness. EXAM: CHEST  2 VIEW COMPARISON:  02/26/2017 FINDINGS: Lungs are adequately inflated without focal airspace consolidation or effusion. Cardiomediastinal silhouette and remainder of the exam is unchanged. IMPRESSION: No active cardiopulmonary disease. Electronically Signed   By: Elberta Fortisaniel  Boyle M.D.   On: 06/08/2017 18:01   ASSESSMENT:  Afib with RVR, Paroxysmal, [facility]  [facility], but stopped it 2 days ago Hx of Ischemic CVA Hx of cocaine use EtOH(excessive ingestion 3, 40 ounce drinks daily, with no hx of withdrawals) HTN, HLP  PLAN/DISCUSSION:  Will attempt to wean him off the diltiazem gtt once we are certain patient didn't have cocaine in his system we can reinitiate his lopressor 25mg , BID and up titrate to get HR's under better control. Goal HR of 100's at rest and 110's with activity.   If there is concern for recurrent cocaine use, will transition the IV dilt to oral dilt 240mg , Qday and increase as required for better rate control. However, this would require the EF to be normal.   Will repeat transthoracic echo in the AM tomorrow.  Patient should be started on anticoagulation. Re-initiate eliquis at 5mg , BID.   Given his non-compliance to his anticoagulation patient will need TEE and cardioversion if he doesn't convert. Discuss the risks and benefits of the procedure. He was not certain about the TEE but will decide tomorrow. Will make him NPO after midnight.  Check TSH, A1c, check trops, repeat EKG.  Recommended decreasing alcohol use. Monitor for withdrawals.   Halina AndreasHarish Ramona Ruark Cardiology fellow

## 2017-06-08 NOTE — ED Triage Notes (Signed)
Pt arrived EMS from, home with reports of Cp and SOB starting 90 minutes ago hx of afib, and CVA currently on elliquis. Pt has been out of medications for the last 2 weeks. Was found to be in a fib RVR with EMS at 160bmp. Pt given 324 asa, 3nitro, 10mg  metoprolol IV, 18G RFA 143/78 100bpm 98%2L

## 2017-06-08 NOTE — ED Notes (Signed)
Admitting provider at bedside.

## 2017-06-08 NOTE — H&P (Signed)
History and Physical    Gary Buck WUJ:[id number] DOB: 10-01-1965 DOA: 06/08/2017  PCP: Loletta Specter, PA-C   Patient coming from: Home  Chief Complaint: Chest pain, SOB   HPI: Gary Buck is a 51 y.o. male with medical history significant for alcohol and substance abuse, paroxysmal atrial fibrillation on Eliquis, bipolar disorder, coronary artery disease, and hypertension, now presenting to the emergency department for evaluation of chest pain and shortness of breath.  She reports that he was in his usual state until approximately 90 minutes prior to arrival when he developed acute onset of shortness of breath and chest pain.  Chest pain is described as a pressure sensation, onset at rest, constant since this afternoon, associated with shortness of breath, and with no alleviating or exacerbating factors identified.  EMS was called, patient was found to be in atrial fibrillation with rate in the 160s, and he was treated with 324 mg of aspirin, nitroglycerin, and 10 mg IV Lopressor prior to arrival in the ED.  Patient notes that he ran out of his metoprolol approximately 2 weeks ago.  Denies fevers or chills, denies shortness of breath or cough, and denies abdominal pain or headache.  ED Course: Upon arrival to the ED, patient is found to be afebrile, saturating well on room air, tachycardic in the 120s, and hypertensive to 160/100.  EKG features atrial fibrillation with PVC, ST abnormalities in the anterolateral leads.  Chest x-rays negative for acute cardiopulmonary disease.  Chemistry panel is unremarkable, CBC features a chronic leukocytosis with WBC 14,000, and troponin is within normal limits.  Patient was given 15 mg IV diltiazem and started on diltiazem infusion in the ED.  Cardiology was consulted by the ED physician, agrees to see the patient in consultation, but requested medical admission.  Tachycardia has improved, still has blood pressure, and the patient will be admitted to  the stepdown unit for ongoing evaluation and management of chest pain and shortness of breath, suspected secondary to atrial fibrillation with RVR in the setting of medication noncompliance.  Review of Systems:  All other systems reviewed and apart from HPI, are negative.  Past Medical History:  Diagnosis Date  . Anginal pain (HCC)   . Bipolar disorder (HCC)   . Depression   . ETOH abuse   . H/O cocaine abuse   . Hypercholesterolemia   . Hypertension   . PAF (paroxysmal atrial fibrillation) (HCC)    [name] 12/21/2014  . Stroke [facility]) 2004   pt sts about 12 years ago.   . Tobacco abuse   . Type II diabetes mellitus (HCC) dx'd 12/21/2014    Past Surgical History:  Procedure Laterality Date  . CYSTECTOMY  ~ 2004   "on the top of my forehead"  . INGUINAL HERNIA REPAIR Left ~ 2012     reports that he has been smoking cigarettes.  He has a 16.00 pack-year smoking history. he has never used smokeless tobacco. He reports that he drinks about 32.4 oz of alcohol per week. He reports that he uses drugs. Drug: Marijuana.  No Known Allergies  Family History  Problem Relation Age of Onset  . Diabetes Mellitus II Mother      Prior to Admission medications   Medication Sig Start Date End Date Taking? Authorizing Provider  amLODipine (NORVASC) 10 MG tablet Take 1 tablet (10 mg total) by mouth daily. 03/21/17   Loletta Specter, PA-C  apixaban (ELIQUIS) 5 MG TABS tablet Take 1 tablet (5 mg total) by mouth  2 (two) times daily. 04/09/17   Quentin AngstJegede, Olugbemiga E, MD  hydrochlorothiazide (HYDRODIURIL) 25 MG tablet Take 1 tablet (25 mg total) by mouth daily. Take on tablet in the morning. 03/21/17   Loletta SpecterGomez, Roger David, PA-C  lisinopril (PRINIVIL,ZESTRIL) 40 MG tablet Take 1 tablet (40 mg total) by mouth daily. 03/21/17   Loletta SpecterGomez, Roger David, PA-C  metFORMIN (GLUCOPHAGE) 500 MG tablet Take 1 tablet (500 mg total) by mouth 2 (two) times daily with a meal. 03/21/17   Loletta SpecterGomez, Roger David, PA-C  metoprolol  tartrate (LOPRESSOR) 25 MG tablet Take 1 tablet (25 mg total) by mouth 2 (two) times daily. 03/21/17   Loletta SpecterGomez, Roger David, PA-C  nicotine (NICODERM CQ - DOSED IN MG/24 HOURS) 21 mg/24hr patch Place 1 patch (21 mg total) onto the skin daily. 11/18/16   Dhungel, Nishant, MD  risperiDONE (RISPERDAL) 1 MG tablet Take 1 mg by mouth 2 (two) times daily. 11/26/16   [provider]  TRILEPTAL 300 MG tablet Take 300 mg by mouth 2 (two) times daily. 11/26/16   [provider]    Physical Exam: Vitals:   06/08/17 1730 06/08/17 1745 06/08/17 1800 06/08/17 1900  BP: (!) 146/87 (!) 149/108 (!) 141/100 (!) 142/86  Pulse: (!) 110 (!) 127 (!) 111 (!) 116  Resp: (!) 28 16 (!) 28 (!) 22  Temp:      TempSrc:      SpO2: 98% 98% 100% 98%  Weight:      Height:          Constitutional: NAD, calm  Eyes: PERTLA, lids and conjunctivae normal ENMT: Mucous membranes are moist. Posterior pharynx clear of any exudate or lesions.   Neck: normal, supple, no masses, no thyromegaly Respiratory: clear to auscultation bilaterally, no wheezing, no crackles. Normal respiratory effort.    Cardiovascular: Rate ~120 and irregular. Trace pretibial edema. No significant JVD. Abdomen: No distension, no tenderness, no masses palpated. Bowel sounds normal.  Musculoskeletal: no clubbing / cyanosis. No joint deformity upper and lower extremities.  Skin: no significant rashes, lesions, ulcers. Warm, dry, well-perfused. Neurologic: CN 2-12 grossly intact. Sensation intact. Strength 5/5 in all 4 limbs.  Psychiatric: Alert and oriented x 3. Calm. Cooperative.   Labs on Admission: I have personally reviewed following labs and imaging studies  CBC: Recent Labs  Lab 06/08/17 1728  WBC 14.0*  HGB 17.1*  HCT 51.2  MCV 93.8  PLT 333   Basic Metabolic Panel: Recent Labs  Lab 06/08/17 1728  NA 139  K 5.0  CL 105  CO2 23  GLUCOSE 99  BUN 9  CREATININE 0.80  CALCIUM 8.6*   GFR: Estimated Creatinine  Clearance: 174.2 mL/min (by C-G formula based on SCr of 0.8 mg/dL). Liver Function Tests: No results for input(s): AST, ALT, ALKPHOS, BILITOT, PROT, ALBUMIN in the last 168 hours. No results for input(s): LIPASE, AMYLASE in the last 168 hours. No results for input(s): AMMONIA in the last 168 hours. Coagulation Profile: No results for input(s): INR, PROTIME in the last 168 hours. Cardiac Enzymes: No results for input(s): CKTOTAL, CKMB, CKMBINDEX, TROPONINI in the last 168 hours. BNP (last 3 results) No results for input(s): PROBNP in the last 8760 hours. HbA1C: No results for input(s): HGBA1C in the last 72 hours. CBG: No results for input(s): GLUCAP in the last 168 hours. Lipid Profile: No results for input(s): CHOL, HDL, LDLCALC, TRIG, CHOLHDL, LDLDIRECT in the last 72 hours. Thyroid Function Tests: No results for input(s): TSH, T4TOTAL, FREET4, T3FREE,  THYROIDAB in the last 72 hours. Anemia Panel: No results for input(s): VITAMINB12, FOLATE, FERRITIN, TIBC, IRON, RETICCTPCT in the last 72 hours. Urine analysis:    Component Value Date/Time   COLORURINE STRAW (A) 10/15/2016 1522   APPEARANCEUR CLEAR 10/15/2016 1522   LABSPEC 1.003 (L) 10/15/2016 1522   PHURINE 5.0 10/15/2016 1522   GLUCOSEU NEGATIVE 10/15/2016 1522   HGBUR NEGATIVE 10/15/2016 1522   BILIRUBINUR negative 02/12/2017 1538   KETONESUR NEGATIVE 10/15/2016 1522   PROTEINUR negative 02/12/2017 1538   PROTEINUR NEGATIVE 10/15/2016 1522   UROBILINOGEN 1.0 02/12/2017 1538   UROBILINOGEN 0.2 12/21/2014 1538   NITRITE negative 02/12/2017 1538   NITRITE NEGATIVE 10/15/2016 1522   LEUKOCYTESUR Negative 02/12/2017 1538   Sepsis Labs: @LABRCNTIP (procalcitonin:4,lacticidven:4) )No results found for this or any previous visit (from the past 240 hour(s)).   Radiological Exams on Admission: Dg Chest 2 View  Result Date: 06/08/2017 CLINICAL DATA:  Mild right-sided chest pain with deep breath and weakness. EXAM: CHEST  2  VIEW COMPARISON:  02/26/2017 FINDINGS: Lungs are adequately inflated without focal airspace consolidation or effusion. Cardiomediastinal silhouette and remainder of the exam is unchanged. IMPRESSION: No active cardiopulmonary disease. Electronically Signed   By: Elberta Fortis M.D.   On: 06/08/2017 18:01    EKG: Independently reviewed. Atrial fibrillation with RVR (rate 127), PVC, ST abnormalities in anterolateral leads.    Assessment/Plan  1. Atrial fibrillation with RVR  - Presents with acute SOB and chest pain, found to be in a fib with rate 160's  - Treated pta with Lopressor 10 mg IV by EMS, and also given NTG and ASA 324  - Rate in 120-130 range in ED and he was given diltiazem bolus and infusion  - Likely secondary to running out of his metoprolol a couple weeks ago, though with CP, an ischemic etiology is considered  - Continue cardiac monitoring, titrate diltiazem gtt for goal rate 60-105, continue supportive care, serial troponin measurements    2. Chest pain; CAD  - Presents with acute-onset CP and SOB  - Suspect this is secondary to #1 and running out of Lopressor a couple weeks ago  - EKG with ST-abnormalities more pronounced than prior, may be rate-related   - Cardiology is consulting and much appreciated, will follow-up on recommendations  - He was treated with ASA 324, Lopresor IV, and NTG with EMS pta  - Continue cardiac monitoring, check serial troponin measurements, continue Lopressor and lisinopril, repeat EKG    3. Hypertension with hypertensive urgency  - BP elevated to 160/100 in ED after 10 mg IV Lopressor with EMS and diltiazem bolus and gtt  - Continue lisinopril, HCTZ, oral Lopressor  - Heart rate still elevated and diltiazem infusion is being titrated; may need additional prn's   4. Bipolar disorder  - Appears stable  - Continue risperidone and Trileptal    5. Alcohol dependence  - Drinks in excess near-daily  - No withdrawal sxs on admission - Monitor  with CIWA and prn Ativan; supplement vitamins and minerals    6. Leukocytosis  - WBC is 14,000 on admission, appears to be chronic  - CXR unremarkable, pt denies f/c, no rhinorrhea or aches  - Noted to have oral temp 37.5 C; check rectal temp and culture if febrile    DVT prophylaxis: Eliquis  Code Status: Full  Family Communication: Discussed with patient Disposition Plan: Admit to SDU Consults called: Cardiology Admission status: Inpatient    Briscoe Deutscher, MD [facility] Pager  [phone]  If 7PM-7AM, please contact night-coverage [email or web address] Password [email or web address]  06/08/2017, 7:26 PM

## 2017-06-09 ENCOUNTER — Other Ambulatory Visit: Payer: Self-pay

## 2017-06-09 ENCOUNTER — Encounter (HOSPITAL_COMMUNITY): Payer: Self-pay

## 2017-06-09 ENCOUNTER — Inpatient Hospital Stay (HOSPITAL_COMMUNITY): Payer: Self-pay

## 2017-06-09 DIAGNOSIS — F3163 Bipolar disorder, current episode mixed, severe, without psychotic features: Secondary | ICD-10-CM

## 2017-06-09 DIAGNOSIS — I4891 Unspecified atrial fibrillation: Secondary | ICD-10-CM

## 2017-06-09 DIAGNOSIS — R079 Chest pain, unspecified: Secondary | ICD-10-CM

## 2017-06-09 DIAGNOSIS — I2510 Atherosclerotic heart disease of native coronary artery without angina pectoris: Secondary | ICD-10-CM

## 2017-06-09 DIAGNOSIS — I2583 Coronary atherosclerosis due to lipid rich plaque: Secondary | ICD-10-CM

## 2017-06-09 LAB — CBC
HEMATOCRIT: 48.5 % (ref 39.0–52.0)
HEMOGLOBIN: 16.4 g/dL (ref 13.0–17.0)
MCH: 31.2 pg (ref 26.0–34.0)
MCHC: 33.8 g/dL (ref 30.0–36.0)
MCV: 92.2 fL (ref 78.0–100.0)
Platelets: 274 10*3/uL (ref 150–400)
RBC: 5.26 MIL/uL (ref 4.22–5.81)
RDW: 14.1 % (ref 11.5–15.5)
WBC: 14.3 10*3/uL — ABNORMAL HIGH (ref 4.0–10.5)

## 2017-06-09 LAB — MRSA PCR SCREENING: MRSA by PCR: NEGATIVE

## 2017-06-09 LAB — URINALYSIS, ROUTINE W REFLEX MICROSCOPIC
Bacteria, UA: NONE SEEN
Bilirubin Urine: NEGATIVE
GLUCOSE, UA: NEGATIVE mg/dL
Ketones, ur: 20 mg/dL — AB
Leukocytes, UA: NEGATIVE
NITRITE: NEGATIVE
Protein, ur: 30 mg/dL — AB
SPECIFIC GRAVITY, URINE: 1.023 (ref 1.005–1.030)
pH: 5 (ref 5.0–8.0)

## 2017-06-09 LAB — BASIC METABOLIC PANEL
ANION GAP: 9 (ref 5–15)
BUN: 7 mg/dL (ref 6–20)
CHLORIDE: 102 mmol/L (ref 101–111)
CO2: 25 mmol/L (ref 22–32)
Calcium: 8.2 mg/dL — ABNORMAL LOW (ref 8.9–10.3)
Creatinine, Ser: 0.76 mg/dL (ref 0.61–1.24)
GFR calc Af Amer: >60 mL/min (ref >60)
GFR calc non Af Amer: >60 mL/min (ref >60)
GLUCOSE: 100 mg/dL — AB (ref 65–99)
POTASSIUM: 3.1 mmol/L — AB (ref 3.5–5.1)
Sodium: 136 mmol/L (ref 135–145)

## 2017-06-09 LAB — RAPID URINE DRUG SCREEN, HOSP PERFORMED
AMPHETAMINES: NOT DETECTED
Barbiturates: NOT DETECTED
Benzodiazepines: NOT DETECTED
Cocaine: POSITIVE — AB
Opiates: NOT DETECTED
TETRAHYDROCANNABINOL: POSITIVE — AB

## 2017-06-09 LAB — GLUCOSE, CAPILLARY
GLUCOSE-CAPILLARY: 144 mg/dL — AB (ref 65–99)
Glucose-Capillary: 158 mg/dL — ABNORMAL HIGH (ref 65–99)
Glucose-Capillary: 121 mg/dL — ABNORMAL HIGH (ref 65–99)

## 2017-06-09 LAB — ECHOCARDIOGRAM COMPLETE
HEIGHTINCHES: 77 in
Weight: 4825.43 oz

## 2017-06-09 LAB — TROPONIN I
TROPONIN I: 0.03 ng/mL — AB (ref <0.03)
Troponin I: <0.03 ng/mL (ref <0.03)

## 2017-06-09 LAB — HEMOGLOBIN A1C
HEMOGLOBIN A1C: 5.8 % — AB (ref 4.8–5.6)
MEAN PLASMA GLUCOSE: 119.76 mg/dL

## 2017-06-09 LAB — TSH: TSH: 0.803 u[IU]/mL (ref 0.350–4.500)

## 2017-06-09 MED ORDER — INSULIN ASPART 100 UNIT/ML ~~LOC~~ SOLN: 0.0000 [IU] | Freq: Every day | SUBCUTANEOUS | Status: DC

## 2017-06-09 MED ORDER — INSULIN ASPART 100 UNIT/ML ~~LOC~~ SOLN
0.0000 [IU] | Freq: Three times a day (TID) | SUBCUTANEOUS | Status: DC
  Administered 2017-06-09: 2 [IU] via SUBCUTANEOUS
  Administered 2017-06-09 – 2017-06-10 (×2): 1 [IU] via SUBCUTANEOUS

## 2017-06-09 MED ORDER — TRAZODONE HCL 50 MG PO TABS
50.0000 mg | ORAL_TABLET | Freq: Once | ORAL | Status: AC
  Administered 2017-06-09: 50 mg via ORAL
  Filled 2017-06-09: qty 1

## 2017-06-09 NOTE — Progress Notes (Signed)
[facility]                                                                              Patient Demographics  Gary Buck, is a 51 y.o. male, DOB - 1966/03/19, ZOX:[id number]  Admit date - 06/08/2017   Admitting Physician Briscoe Deutscherimothy S Opyd, MD  Outpatient Primary MD for the patient is Loletta SpecterGomez, Roger David, PA-C  Outpatient specialists:   LOS - 1  days   Medical records reviewed and are as summarized below:    Chief Complaint  Patient presents with  . Chest Pain       Brief summary   Patient is a 51 year old male with history of alcohol and substance abuse, paroxysmal A. fib on Eliquis, bipolar disorder, CAD, hypertension presented with chest pain and shortness of breath.  Patient was found in atrial fibrillation with RVR by EMS, rate 160s and was given 10 mg IV Lopressor.  Patient reported that he ran out of his metoprolol approximately 2 weeks ago. Cardiology was consulted.  Assessment & Plan    Principal Problem: Paroxysmal atrial fibrillation with RVR (HCC) -Currently on Cardizem drip, rate still in 90s to low 100s -Troponins negative, started on metoprolol but stopped BB due to cocaine + -UDS positive for cocaine and THC.  -Follow 2D echo, TSH, troponins flat 0.03, chest pain improved -Continue Eliquis, per patient stopped 2 days ago -Cardiology consulted, will need TEE prior to DC CV . Active Problems:   Chest pain -Currently resolved, troponin x3 flat 0.03 -Follow 2D echo, cardiology consulted    Polysubstance abuse (HCC), cocaine, THC, alcohol -Patient counseled strongly on cocaine, THC cessation, alcohol cessation -DC'd beta-blocker, continue CIWA protocol with Ativan    Bipolar affective disorder, mixed, severe (HCC) -Continue risperidone, Trileptal    Type II diabetes mellitus (HCC) -Hold metformin, follow hemoglobin A1c, placed on sliding scale insulin    Uncontrolled hypertension -BP elevated in ED, received 10 mg IV Lopressor,  diltiazem bolus and drip -Lopressor discontinued, continue lisinopril, HCTZ, diltiazem infusion  Chronic leukocytosis -Unclear etiology, no fevers, UA negative for UTI, chest x-ray negative for pneumonia   Code Status: full  DVT Prophylaxis:  eliquis Family Communication: Discussed in detail with the patient, all imaging results, lab results explained to the patient   Disposition Plan:   Time Spent in minutes  35 minutes  Procedures:    Consultants:   Cardiology  Antimicrobials:      Medications  Scheduled Meds: . amLODipine  10 mg Oral Daily  . apixaban  5 mg Oral BID  . folic acid  1 mg Oral Daily  . hydrochlorothiazide  25 mg Oral Daily  . insulin aspart  0-5 Units Subcutaneous QHS  . insulin aspart  0-9 Units Subcutaneous TID WC  . lisinopril  40 mg Oral Daily  . metoprolol tartrate  25 mg Oral BID  . multivitamin with minerals  1 tablet Oral Daily  . Oxcarbazepine  300 mg Oral BID  . risperiDONE  1 mg Oral BID  . thiamine  100 mg Oral Daily   Continuous Infusions: . diltiazem (CARDIZEM) infusion 15 mg/hr (06/09/17 0804)   PRN  Meds:.acetaminophen, LORazepam, nitroGLYCERIN, ondansetron (ZOFRAN) IV   Antibiotics   Anti-infectives (From admission, onward)   None        Subjective:   Gary Buck was seen and examined today.  Chest pain improving.  Heart rate still not well controlled.  Patient denies dizziness, abdominal pain, N/V/D/C, new weakness, numbess, tingling. No fevers.  Objective:   Vitals:   06/08/17 2042 06/08/17 2333 06/09/17 0616 06/09/17 0808  BP: (!) 138/99 129/88 (!) 159/99 (!) 163/91  Pulse:  (!) 113 89 86  Resp: 19 20  18   Temp: 98.6 F (37 C) 98.5 F (36.9 C) 98.3 F (36.8 C) (!) 97.4 F (36.3 C)  TempSrc: Oral Oral Oral Oral  SpO2: 98% 99%  99%  Weight:   (!) 136.8 kg (301 lb 9.4 oz)   Height:        Intake/Output Summary (Last 24 hours) at 06/09/2017 0945 Last data filed at 06/09/2017 16100808 Gross per 24 hour    Intake 42 ml  Output 450 ml  Net -408 ml     Wt Readings from Last 3 Encounters:  06/09/17 (!) 136.8 kg (301 lb 9.4 oz)  03/21/17 (!) 143.8 kg (317 lb)  03/12/17 (!) 149.2 kg (329 lb)     Exam  General: Alert and oriented x 3, NAD  Eyes: PERRLA, EOMI, Anicteric Sclera,  HEENT:  Atraumatic, normocephalic  Cardiovascular: S1 S2 auscultated, irregularly irregular, tachycardia   Respiratory: Clear to auscultation bilaterally, no wheezing, rales or rhonchi  Gastrointestinal: Soft, nontender, nondistended, + bowel sounds  Ext: no pedal edema bilaterally  Neuro: AAOx3, Cr N's II- XII. Strength 5/5 upper and lower extremities bilaterally, speech clear  Musculoskeletal: No digital cyanosis, clubbing  Skin: No rashes  Psych: Normal affect and demeanor, alert and oriented x3    Data Reviewed:  I have personally reviewed following labs and imaging studies  Micro Results Recent Results (from the past 240 hour(s))  MRSA PCR Screening     Status: None   Collection Time: 06/09/17 12:22 AM  Result Value Ref Range Status   MRSA by PCR NEGATIVE NEGATIVE Final    Comment:        The GeneXpert MRSA Assay (FDA approved for NASAL specimens only), is one component of a comprehensive MRSA colonization surveillance program. It is not intended to diagnose MRSA infection nor to guide or monitor treatment for MRSA infections.     Radiology Reports Dg Chest 2 View  Result Date: 06/08/2017 CLINICAL DATA:  Mild right-sided chest pain with deep breath and weakness. EXAM: CHEST  2 VIEW COMPARISON:  02/26/2017 FINDINGS: Lungs are adequately inflated without focal airspace consolidation or effusion. Cardiomediastinal silhouette and remainder of the exam is unchanged. IMPRESSION: No active cardiopulmonary disease. Electronically Signed   By: Elberta Fortisaniel  Boyle M.D.   On: 06/08/2017 18:01    Lab Data:  CBC: Recent Labs  Lab 06/08/17 1728 06/09/17 0900  WBC 14.0* 14.3*  HGB 17.1* 16.4   HCT 51.2 48.5  MCV 93.8 92.2  PLT 333 274   Basic Metabolic Panel: Recent Labs  Lab 06/08/17 1728 06/08/17 1925  NA 139  --   K 5.0  --   CL 105  --   CO2 23  --   GLUCOSE 99  --   BUN 9  --   CREATININE 0.80  --   CALCIUM 8.6*  --   MG  --  2.2   GFR: Estimated Creatinine Clearance: 169.1 mL/min (by C-G formula based on  SCr of 0.8 mg/dL). Liver Function Tests: No results for input(s): AST, ALT, ALKPHOS, BILITOT, PROT, ALBUMIN in the last 168 hours. No results for input(s): LIPASE, AMYLASE in the last 168 hours. No results for input(s): AMMONIA in the last 168 hours. Coagulation Profile: No results for input(s): INR, PROTIME in the last 168 hours. Cardiac Enzymes: Recent Labs  Lab 06/08/17 1925 06/09/17 0206  TROPONINI 0.03* 0.03*   BNP (last 3 results) No results for input(s): PROBNP in the last 8760 hours. HbA1C: No results for input(s): HGBA1C in the last 72 hours. CBG: No results for input(s): GLUCAP in the last 168 hours. Lipid Profile: No results for input(s): CHOL, HDL, LDLCALC, TRIG, CHOLHDL, LDLDIRECT in the last 72 hours. Thyroid Function Tests: No results for input(s): TSH, T4TOTAL, FREET4, T3FREE, THYROIDAB in the last 72 hours. Anemia Panel: No results for input(s): VITAMINB12, FOLATE, FERRITIN, TIBC, IRON, RETICCTPCT in the last 72 hours. Urine analysis:    Component Value Date/Time   COLORURINE YELLOW 06/09/2017 0624   APPEARANCEUR CLEAR 06/09/2017 0624   LABSPEC 1.023 06/09/2017 0624   PHURINE 5.0 06/09/2017 0624   GLUCOSEU NEGATIVE 06/09/2017 0624   HGBUR SMALL (A) 06/09/2017 0624   BILIRUBINUR NEGATIVE 06/09/2017 0624   BILIRUBINUR negative 02/12/2017 1538   KETONESUR 20 (A) 06/09/2017 0624   PROTEINUR 30 (A) 06/09/2017 0624   UROBILINOGEN 1.0 02/12/2017 1538   UROBILINOGEN 0.2 12/21/2014 1538   NITRITE NEGATIVE 06/09/2017 0624   LEUKOCYTESUR NEGATIVE 06/09/2017 2130     Shavonne Ambroise M.D. [facility] 06/09/2017, 9:45  AM  Pager: [phone] Between 7am to 7pm - call Pager - [phone]  After 7pm go to [email or web address] - password [email or web address]  Call night coverage person covering after 7pm

## 2017-06-09 NOTE — Progress Notes (Signed)
Pt arrived from ED. CHG bath completed. Vitals stable. Denies pain at this time. Pt placed on telemetry and oriented to room. Plan of care reviewed with pt. Will continue to follow.

## 2017-06-09 NOTE — H&P (View-Only) (Signed)
 Progress Note  Patient Name: Gary Buck Date of Encounter: 06/09/2017  Primary Cardiologist: [place]  Subjective   Denies chest pain or sob. Still feels his atrial fib  Inpatient Medications    Scheduled Meds: . amLODipine  10 mg Oral Daily  . apixaban  5 mg Oral BID  . folic acid  1 mg Oral Daily  . hydrochlorothiazide  25 mg Oral Daily  . insulin aspart  0-5 Units Subcutaneous QHS  . insulin aspart  0-9 Units Subcutaneous TID WC  . lisinopril  40 mg Oral Daily  . metoprolol tartrate  25 mg Oral BID  . multivitamin with minerals  1 tablet Oral Daily  . Oxcarbazepine  300 mg Oral BID  . risperiDONE  1 mg Oral BID  . thiamine  100 mg Oral Daily   Continuous Infusions: . diltiazem (CARDIZEM) infusion 15 mg/hr (06/09/17 0804)   PRN Meds: acetaminophen, LORazepam, nitroGLYCERIN, ondansetron (ZOFRAN) IV   Vital Signs    Vitals:   06/08/17 2042 06/08/17 2333 06/09/17 0616 06/09/17 0808  BP: (!) 138/99 129/88 (!) 159/99 (!) 163/91  Pulse:  (!) 113 89 86  Resp: 19 20  18  Temp: 98.6 F (37 C) 98.5 F (36.9 C) 98.3 F (36.8 C) (!) 97.4 F (36.3 C)  TempSrc: Oral Oral Oral Oral  SpO2: 98% 99%  99%  Weight:   (!) 301 lb 9.4 oz (136.8 kg)   Height:        Intake/Output Summary (Last 24 hours) at 06/09/2017 1000 Last data filed at 06/09/2017 0808 Gross per 24 hour  Intake 42 ml  Output 450 ml  Net -408 ml   Filed Weights   06/08/17 1719 06/08/17 2030 06/09/17 0616  Weight: (!) 320 lb (145.2 kg) (!) 301 lb 9.6 oz (136.8 kg) (!) 301 lb 9.4 oz (136.8 kg)    Telemetry    Atrial fib with a controlled VR - Personally Reviewed  ECG    Atrial fib with a controlled VR - Personally Reviewed  Physical Exam   GEN: No acute distress.   Neck: 7 cm JVD Cardiac: IRIRR, no murmurs, rubs, or gallops.  Respiratory: Clear to auscultation bilaterally. GI: Soft, nontender, non-distended  MS: No edema; No deformity. Neuro:  Nonfocal  Psych: Normal affect    Labs    Chemistry Recent Labs  Lab 06/08/17 1728  NA 139  K 5.0  CL 105  CO2 23  GLUCOSE 99  BUN 9  CREATININE 0.80  CALCIUM 8.6*  GFRNONAA >60  GFRAA >60  ANIONGAP 11     Hematology Recent Labs  Lab 06/08/17 1728 06/09/17 0900  WBC 14.0* 14.3*  RBC 5.46 5.26  HGB 17.1* 16.4  HCT 51.2 48.5  MCV 93.8 92.2  MCH 31.3 31.2  MCHC 33.4 33.8  RDW 14.8 14.1  PLT 333 274    Cardiac Enzymes Recent Labs  Lab 06/08/17 1925 06/09/17 0206  TROPONINI 0.03* 0.03*    Recent Labs  Lab 06/08/17 1737  TROPIPOC 0.03     BNPNo results for input(s): BNP, PROBNP in the last 168 hours.   DDimer No results for input(s): DDIMER in the last 168 hours.   Radiology    Dg Chest 2 View  Result Date: 06/08/2017 CLINICAL DATA:  Mild right-sided chest pain with deep breath and weakness. EXAM: CHEST  2 VIEW COMPARISON:  02/26/2017 FINDINGS: Lungs are adequately inflated without focal airspace consolidation or effusion. Cardiomediastinal silhouette and remainder of the exam is unchanged. IMPRESSION:   No active cardiopulmonary disease. Electronically Signed   By: Elberta Fortisaniel  Boyle M.D.   On: 06/08/2017 18:01    Cardiac Studies   2D echo is pending  Patient Profile     51 y.o. male admitted with symptomatic atrial fib, sob and chest pressure with a RVR  Assessment & Plan    1. Atrial fib with a RVR - his rate is now controlled. He will continue his current meds. He admits to stopping his Eliquis 2 days before admission.  2. Polysubstance abuse - his tox screen is positive for both THC and cocaine. No beta blocker. Continue cardizem.  3. Coags - as he has not been anti-coagulated and will be around 48 hours out from initiation of atrial fib and because he has a h/o prior embolic stroke, he will need a TEE prior to DCCV.  4. Obesity - he needs to lose weight.   For questions or updates, please contact [facility] Please consult [email or web address] for contact info under  Cardiology/STEMI.      Signed, Lewayne BuntingGregg Swetha Rayle, MD  06/09/2017, 10:00 AM  Patient ID: Gary Buck, male   DOB: [birth year], 51 y.o.   MRN: [id number]

## 2017-06-09 NOTE — Progress Notes (Signed)
  Echocardiogram 2D Echocardiogram has been performed.  Delcie RochENNINGTON, Lanayah Gartley 06/09/2017, 9:12 AM

## 2017-06-09 NOTE — Progress Notes (Signed)
Progress Note  Patient Name: Gary Buck Date of Encounter: 06/09/2017  Primary Cardiologist: Darl HouseholderKoneswaren  Subjective   Denies chest pain or sob. Still feels his atrial fib  Inpatient Medications    Scheduled Meds: . amLODipine  10 mg Oral Daily  . apixaban  5 mg Oral BID  . folic acid  1 mg Oral Daily  . hydrochlorothiazide  25 mg Oral Daily  . insulin aspart  0-5 Units Subcutaneous QHS  . insulin aspart  0-9 Units Subcutaneous TID WC  . lisinopril  40 mg Oral Daily  . metoprolol tartrate  25 mg Oral BID  . multivitamin with minerals  1 tablet Oral Daily  . Oxcarbazepine  300 mg Oral BID  . risperiDONE  1 mg Oral BID  . thiamine  100 mg Oral Daily   Continuous Infusions: . diltiazem (CARDIZEM) infusion 15 mg/hr (06/09/17 0804)   PRN Meds: acetaminophen, LORazepam, nitroGLYCERIN, ondansetron (ZOFRAN) IV   Vital Signs    Vitals:   06/08/17 2042 06/08/17 2333 06/09/17 0616 06/09/17 0808  BP: (!) 138/99 129/88 (!) 159/99 (!) 163/91  Pulse:  (!) 113 89 86  Resp: 19 20  18   Temp: 98.6 F (37 C) 98.5 F (36.9 C) 98.3 F (36.8 C) (!) 97.4 F (36.3 C)  TempSrc: Oral Oral Oral Oral  SpO2: 98% 99%  99%  Weight:   (!) 301 lb 9.4 oz (136.8 kg)   Height:        Intake/Output Summary (Last 24 hours) at 06/09/2017 1000 Last data filed at 06/09/2017 16100808 Gross per 24 hour  Intake 42 ml  Output 450 ml  Net -408 ml   Filed Weights   06/08/17 1719 06/08/17 2030 06/09/17 0616  Weight: (!) 320 lb (145.2 kg) (!) 301 lb 9.6 oz (136.8 kg) (!) 301 lb 9.4 oz (136.8 kg)    Telemetry    Atrial fib with a controlled VR - Personally Reviewed  ECG    Atrial fib with a controlled VR - Personally Reviewed  Physical Exam   GEN: No acute distress.   Neck: 7 cm JVD Cardiac: IRIRR, no murmurs, rubs, or gallops.  Respiratory: Clear to auscultation bilaterally. GI: Soft, nontender, non-distended  MS: No edema; No deformity. Neuro:  Nonfocal  Psych: Normal affect    Labs    Chemistry Recent Labs  Lab 06/08/17 1728  NA 139  K 5.0  CL 105  CO2 23  GLUCOSE 99  BUN 9  CREATININE 0.80  CALCIUM 8.6*  GFRNONAA >60  GFRAA >60  ANIONGAP 11     Hematology Recent Labs  Lab 06/08/17 1728 06/09/17 0900  WBC 14.0* 14.3*  RBC 5.46 5.26  HGB 17.1* 16.4  HCT 51.2 48.5  MCV 93.8 92.2  MCH 31.3 31.2  MCHC 33.4 33.8  RDW 14.8 14.1  PLT 333 274    Cardiac Enzymes Recent Labs  Lab 06/08/17 1925 06/09/17 0206  TROPONINI 0.03* 0.03*    Recent Labs  Lab 06/08/17 1737  TROPIPOC 0.03     BNPNo results for input(s): BNP, PROBNP in the last 168 hours.   DDimer No results for input(s): DDIMER in the last 168 hours.   Radiology    Dg Chest 2 View  Result Date: 06/08/2017 CLINICAL DATA:  Mild right-sided chest pain with deep breath and weakness. EXAM: CHEST  2 VIEW COMPARISON:  02/26/2017 FINDINGS: Lungs are adequately inflated without focal airspace consolidation or effusion. Cardiomediastinal silhouette and remainder of the exam is unchanged. IMPRESSION:  No active cardiopulmonary disease. Electronically Signed   By: Elberta Fortisaniel  Boyle M.D.   On: 06/08/2017 18:01    Cardiac Studies   2D echo is pending  Patient Profile     51 y.o. male admitted with symptomatic atrial fib, sob and chest pressure with a RVR  Assessment & Plan    1. Atrial fib with a RVR - his rate is now controlled. He will continue his current meds. He admits to stopping his Eliquis 2 days before admission.  2. Polysubstance abuse - his tox screen is positive for both THC and cocaine. No beta blocker. Continue cardizem.  3. Coags - as he has not been anti-coagulated and will be around 48 hours out from initiation of atrial fib and because he has a h/o prior embolic stroke, he will need a TEE prior to DCCV.  4. Obesity - he needs to lose weight.   For questions or updates, please contact [facility] Please consult [email or web address] for contact info under  Cardiology/STEMI.      Signed, Lewayne BuntingGregg Taylor, MD  06/09/2017, 10:00 AM  Patient ID: Gary Morosexter Buck, male   DOB: [birth year], 51 y.o.   MRN: [id number]

## 2017-06-10 ENCOUNTER — Inpatient Hospital Stay (HOSPITAL_COMMUNITY): Payer: Self-pay | Admitting: Anesthesiology

## 2017-06-10 ENCOUNTER — Encounter (HOSPITAL_COMMUNITY): Payer: Self-pay | Admitting: *Deleted

## 2017-06-10 ENCOUNTER — Encounter (HOSPITAL_COMMUNITY)
Admission: EM | Disposition: A | Discharge status: Home / Self Care | Payer: Self-pay | Source: Home / Self Care | Attending: Internal Medicine

## 2017-06-10 LAB — GLUCOSE, CAPILLARY
GLUCOSE-CAPILLARY: 141 mg/dL — AB (ref 65–99)
GLUCOSE-CAPILLARY: 122 mg/dL — AB (ref 65–99)
GLUCOSE-CAPILLARY: 138 mg/dL — AB (ref 65–99)
Glucose-Capillary: 124 mg/dL — ABNORMAL HIGH (ref 65–99)

## 2017-06-10 SURGERY — INVASIVE LAB ABORTED CASE
Anesthesia: Monitor Anesthesia Care

## 2017-06-10 MED ORDER — ONDANSETRON HCL 4 MG/2ML IJ SOLN: 4.0000 mg | Freq: Once | INTRAMUSCULAR | Status: DC | PRN

## 2017-06-10 MED ORDER — FENTANYL CITRATE (PF) 100 MCG/2ML IJ SOLN: 25.0000 ug | INTRAMUSCULAR | Status: DC | PRN

## 2017-06-10 MED ORDER — LIDOCAINE HCL (CARDIAC) 20 MG/ML IV SOLN
INTRAVENOUS | Status: DC | PRN
  Administered 2017-06-10: 100 mg via INTRAVENOUS

## 2017-06-10 MED ORDER — SODIUM CHLORIDE 0.9 % IV SOLN
INTRAVENOUS | Status: DC | PRN
  Administered 2017-06-10: 10:00:00 via INTRAVENOUS

## 2017-06-10 MED ORDER — BUTAMBEN-TETRACAINE-BENZOCAINE 2-2-14 % EX AERO
INHALATION_SPRAY | CUTANEOUS | Status: DC | PRN
  Administered 2017-06-10: 2 via TOPICAL

## 2017-06-10 MED ORDER — PROPOFOL 500 MG/50ML IV EMUL
INTRAVENOUS | Status: DC | PRN
  Administered 2017-06-10: 150 ug/kg/min via INTRAVENOUS

## 2017-06-10 MED ORDER — SODIUM CHLORIDE 0.9 % IV SOLN: INTRAVENOUS | Status: DC

## 2017-06-10 NOTE — Anesthesia Preprocedure Evaluation (Signed)
Anesthesia Evaluation  Patient identified by MRN, date of birth, ID band Patient awake    Reviewed: Allergy & Precautions, NPO status , Patient's Chart, lab work & pertinent test results, reviewed documented beta blocker date and time   Airway Mallampati: II  TM Distance: >3 FB Neck ROM: Full    Dental  (+) Dental Advisory Given, Chipped, Poor Dentition   Pulmonary Current Smoker,    Pulmonary exam normal breath sounds clear to auscultation       Cardiovascular hypertension, Pt. on home beta blockers and Pt. on medications + CAD and + Past MI  + dysrhythmias Atrial Fibrillation  Rhythm:Irregular Rate:Normal     Neuro/Psych PSYCHIATRIC DISORDERS Depression Bipolar Disorder CVA, No Residual Symptoms    GI/Hepatic negative GI ROS, (+)     substance abuse  alcohol use, cocaine use and marijuana use,   Endo/Other  diabetes, Type 2, Oral Hypoglycemic AgentsObesity   Renal/GU negative Renal ROS     Musculoskeletal negative musculoskeletal ROS (+)   Abdominal   Peds  Hematology  (+) Blood dyscrasia (Eliquis), ,   Anesthesia Other Findings Day of surgery medications reviewed with the patient.  Reproductive/Obstetrics                             Anesthesia Physical Anesthesia Plan  ASA: III  Anesthesia Plan: MAC   Post-op Pain Management:    Induction: Intravenous  PONV Risk Score and Plan: Treatment may vary due to age or medical condition and Propofol infusion  Airway Management Planned: Nasal Cannula  Additional Equipment:   Intra-op Plan:   Post-operative Plan:   Informed Consent: I have reviewed the patients History and Physical, chart, labs and discussed the procedure including the risks, benefits and alternatives for the proposed anesthesia with the patient or authorized representative who has indicated his/her understanding and acceptance.   Dental advisory given  Plan  Discussed with: CRNA and Anesthesiologist  Anesthesia Plan Comments: (Discussed risks/benefits/alternatives to MAC sedation including need for ventilatory support, hypotension, need for conversion to general anesthesia.  All patient questions answered.  Patient/guardian wishes to proceed.)        Anesthesia Quick Evaluation

## 2017-06-10 NOTE — Progress Notes (Signed)
12 leads EKG critical result "stemi"patient is complaining of 2 out of 10 chest pain,VS taken B/P 116/67,O2 saturation 94% on room air.Dr. Davineni,cardiology on call notified with out orders made.Will continue to monitor.

## 2017-06-10 NOTE — Progress Notes (Signed)
Progress Note  Patient Name: Gary Buck Date of Encounter: 06/10/2017  Primary Cardiologist:   Prentice DockerSuresh Koneswaran, MD   Subjective   He had chest pain yesterday but EKG actually looked improved compared to previous and his troponin did not bump.  He has had no further chest pain.  Now back in NSR spontaneously.    Inpatient Medications    Scheduled Meds: . [MAR Hold] amLODipine  10 mg Oral Daily  . [MAR Hold] apixaban  5 mg Oral BID  . [MAR Hold] folic acid  1 mg Oral Daily  . [MAR Hold] hydrochlorothiazide  25 mg Oral Daily  . [MAR Hold] insulin aspart  0-5 Units Subcutaneous QHS  . [MAR Hold] insulin aspart  0-9 Units Subcutaneous TID WC  . [MAR Hold] lisinopril  40 mg Oral Daily  . [MAR Hold] multivitamin with minerals  1 tablet Oral Daily  . [MAR Hold] Oxcarbazepine  300 mg Oral BID  . [MAR Hold] risperiDONE  1 mg Oral BID  . [MAR Hold] thiamine  100 mg Oral Daily   Continuous Infusions: . diltiazem (CARDIZEM) infusion 15 mg/hr (06/10/17 0210)   PRN Meds: [MAR Hold] acetaminophen, [MAR Hold] LORazepam, [MAR Hold] nitroGLYCERIN, [MAR Hold] ondansetron (ZOFRAN) IV   Vital Signs    Vitals:   06/09/17 1951 06/10/17 0004 06/10/17 0452 06/10/17 0501  BP: 126/75 131/70 111/67   Pulse: 84 78 77   Resp: (!) 22 (!) 27 16 (!) 21  Temp: 98.5 F (36.9 C)  97.8 F (36.6 C)   TempSrc: Oral  Oral   SpO2: 98% 93% 96% 98%  Weight:    (!) 304 lb 12.8 oz (138.3 kg)  Height:        Intake/Output Summary (Last 24 hours) at 06/10/2017 0938 Last data filed at 06/10/2017 0700 Gross per 24 hour  Intake 997 ml  Output 2700 ml  Net -1703 ml   Filed Weights   06/08/17 2030 06/09/17 0616 06/10/17 0501  Weight: (!) 301 lb 9.6 oz (136.8 kg) (!) 301 lb 9.4 oz (136.8 kg) (!) 304 lb 12.8 oz (138.3 kg)    Telemetry    NSR - Personally Reviewed  ECG    NA - Personally Reviewed  Physical Exam   GEN: No acute distress.   Neck: No  JVD Cardiac: RRR, no murmurs, rubs, or  gallops.  Respiratory: Clear  to auscultation bilaterally. GI: Soft, nontender, non-distended  MS: No  edema; No deformity. Neuro:  Nonfocal  Psych: Normal affect   Labs    Chemistry Recent Labs  Lab 06/08/17 1728 06/09/17 0900  NA 139 136  K 5.0 3.1*  CL 105 102  CO2 23 25  GLUCOSE 99 100*  BUN 9 7  CREATININE 0.80 0.76  CALCIUM 8.6* 8.2*  GFRNONAA >60 >60  GFRAA >60 >60  ANIONGAP 11 9     Hematology Recent Labs  Lab 06/08/17 1728 06/09/17 0900  WBC 14.0* 14.3*  RBC 5.46 5.26  HGB 17.1* 16.4  HCT 51.2 48.5  MCV 93.8 92.2  MCH 31.3 31.2  MCHC 33.4 33.8  RDW 14.8 14.1  PLT 333 274    Cardiac Enzymes Recent Labs  Lab 06/08/17 1925 06/09/17 0206 06/09/17 0900  TROPONINI 0.03* 0.03* <0.03    Recent Labs  Lab 06/08/17 1737  TROPIPOC 0.03     BNPNo results for input(s): BNP, PROBNP in the last 168 hours.   DDimer No results for input(s): DDIMER in the last 168 hours.  Radiology    Dg Chest 2 View  Result Date: 06/08/2017 CLINICAL DATA:  Mild right-sided chest pain with deep breath and weakness. EXAM: CHEST  2 VIEW COMPARISON:  02/26/2017 FINDINGS: Lungs are adequately inflated without focal airspace consolidation or effusion. Cardiomediastinal silhouette and remainder of the exam is unchanged. IMPRESSION: No active cardiopulmonary disease. Electronically Signed   By: Elberta Fortisaniel  Boyle M.D.   On: 06/08/2017 18:01    Cardiac Studies   ECHO:  06/09/17  Study Conclusions  - Left ventricle: The cavity size was normal. There was moderate   concentric hypertrophy. Systolic function was normal. The   estimated ejection fraction was in the range of 60% to 65%. Wall   motion was normal; there were no regional wall motion   abnormalities. Doppler parameters are consistent with abnormal   left ventricular relaxation (grade 1 diastolic dysfunction). - Left atrium: The atrium was moderately dilated.  Impressions:  - No cardiac source of emboli was  indentified.  Patient Profile     51 y.o. male admitted with symptomatic atrial fib, sob and chest pressure with a RVR   Assessment & Plan    ATRIAL FIB:  Plan was for TEE/DCCV.  He had stopped Eliquis prior to admission.  Now restarted.   Prior to TEE/DCCV he converted spontaneously to NSR.  Discontinue IV Dilt.    POLYSUBSTANCE ABUSE:  Avoiding beta blockers.    CHEST PAIN:     For questions or updates, please contact [facility] Please consult [email or web address] for contact info under Cardiology/STEMI.   Signed, Rollene RotundaJames Tabby Beaston, MD  06/10/2017, 9:38 AM

## 2017-06-10 NOTE — Transfer of Care (Signed)
Immediate Anesthesia Transfer of Care Note  Patient: Gary Buck  Procedure(s) Performed: TRANSESOPHAGEAL ECHOCARDIOGRAM (TEE) (N/A ) CARDIOVERSION (N/A )  Patient Location: Endoscopy Unit  Anesthesia Type:MAC  Level of Consciousness: awake, alert  and oriented  Airway & Oxygen Therapy: Patient Spontanous Breathing and Patient connected to nasal cannula oxygen  Post-op Assessment: Report given to RN and Post -op Vital signs reviewed and stable  Post vital signs: Reviewed and stable  Last Vitals:  Vitals:   06/10/17 0501 06/10/17 0939  BP:  (!) 169/97  Pulse:  78  Resp: (!) 21 14  Temp:  37.1 C  SpO2: 98% 100%    Last Pain:  Vitals:   06/10/17 0939  TempSrc: Oral  PainSc:          Complications: No apparent anesthesia complications

## 2017-06-10 NOTE — CV Procedure (Signed)
    Pt sedated for TEE/DCCV with lidocaine 100 mg and diprovan 200 mg; prior to initiation of TEE, pt converted spontaneously to sinus rhythm; procedure canceled; continue apixaban.  Olga MillersBrian Nitya Cauthon, MD

## 2017-06-10 NOTE — Progress Notes (Signed)
[facility]                                                                              Patient Demographics  Gary Buck, is a 51 y.o. male, DOB - 03-20-1966, ZOX:[id number]  Admit date - 06/08/2017   Admitting Physician Briscoe Deutscher, MD  Outpatient Primary MD for the patient is Loletta Specter, PA-C  Outpatient specialists:   LOS - 2  days   Medical records reviewed and are as summarized below:    Chief Complaint  Patient presents with  . Chest Pain       Brief summary   Patient is a 51 year old male with history of alcohol and substance abuse, paroxysmal A. fib on Eliquis, bipolar disorder, CAD, hypertension presented with chest pain and shortness of breath.  Patient was found in atrial fibrillation with RVR by EMS, rate 160s and was given 10 mg IV Lopressor.  Patient reported that he ran out of his metoprolol approximately 2 weeks ago. Cardiology was consulted.  Assessment & Plan    Principal Problem: Paroxysmal atrial fibrillation with RVR (HCC) -Currently on Cardizem drip, rate still high, -Troponins negative, started on metoprolol but stopped BB due to cocaine + -UDS positive for cocaine and THC.  -2D echo showed EF of 60-65%, grade 1 diastolic dysfunction  -Cardiology consulted, plan for TEE, DCCV today  -Continue Eliquis  -TSH 0.8 . Active Problems:   Chest pain, atypical -Currently resolved, troponin x3 flat 0.03 -2D echo showed EF of 60-65% with grade 1 diastolic dysfunction, cardiology following    Polysubstance abuse (HCC), cocaine, THC, alcohol -Patient counseled strongly on cocaine, THC cessation, alcohol cessation -DC'd beta-blocker, continue CIWA protocol with Ativan    Bipolar affective disorder, mixed, severe (HCC) -Continue risperidone, Trileptal    Type II diabetes mellitus (HCC) - Hold metformin, -Hemoglobin A1c 5.8, continue sliding scale insulin     Uncontrolled hypertension -BP improving, Lopressor  discontinued -Continue lisinopril, HCTZ, Cardizem infusion  Chronic leukocytosis -Unclear etiology, no fevers, UA negative for UTI, chest x-ray negative for pneumonia   Code Status: full  DVT Prophylaxis:  eliquis Family Communication: Discussed in detail with the patient, all imaging results, lab results explained to the patient   Disposition Plan:   Time Spent in minutes  25 minutes  Procedures:  2D echo  Consultants:   Cardiology  Antimicrobials:      Medications  Scheduled Meds: . amLODipine  10 mg Oral Daily  . apixaban  5 mg Oral BID  . folic acid  1 mg Oral Daily  . hydrochlorothiazide  25 mg Oral Daily  . insulin aspart  0-5 Units Subcutaneous QHS  . insulin aspart  0-9 Units Subcutaneous TID WC  . lisinopril  40 mg Oral Daily  . multivitamin with minerals  1 tablet Oral Daily  . Oxcarbazepine  300 mg Oral BID  . risperiDONE  1 mg Oral BID  . thiamine  100 mg Oral Daily   Continuous Infusions: . diltiazem (CARDIZEM) infusion 15 mg/hr (06/10/17 0210)   PRN Meds:.acetaminophen, fentaNYL (SUBLIMAZE) injection, LORazepam, nitroGLYCERIN, ondansetron (ZOFRAN) IV, ondansetron (ZOFRAN) IV   Antibiotics  Anti-infectives (From admission, onward)   None        Subjective:   Gary Buck was seen and examined today.  Denies any specific complaints.  Heart rate still not well controlled in the low 100s on Cardizem drip.  Patient denies dizziness, abdominal pain, N/V/D/C, new weakness, numbess, tingling. No fevers.  Objective:   Vitals:   06/10/17 1018 06/10/17 1020 06/10/17 1028 06/10/17 1037  BP: 94/61 (!) 141/88 (!) 141/88 (!) 141/87  Pulse:   74 70  Resp:   14 19  Temp: 97.7 F (36.5 C)     TempSrc: Oral     SpO2: 100%  100% 99%  Weight:      Height:        Intake/Output Summary (Last 24 hours) at 06/10/2017 1155 Last data filed at 06/10/2017 1015 Gross per 24 hour  Intake 1297 ml  Output 2700 ml  Net -1403 ml     Wt Readings from  Last 3 Encounters:  06/10/17 (!) 138.3 kg (304 lb 12.8 oz)  03/21/17 (!) 143.8 kg (317 lb)  03/12/17 (!) 149.2 kg (329 lb)     Exam   General: Alert and oriented x 3, NAD  Eyes:   HEENT:   Cardiovascular: S1 S2 clear, irregularly irregular  No pedal edema b/l  Respiratory: Clear to auscultation bilaterally, no wheezing, rales or rhonchi  Gastrointestinal: Soft, nontender, nondistended, + bowel sounds  Ext: no pedal edema bilaterally  Neuro: no new deficits  Musculoskeletal: No digital cyanosis, clubbing  Skin: No rashes  Psych: Normal affect and demeanor, alert and oriented x3   Data Reviewed:  I have personally reviewed following labs and imaging studies  Micro Results Recent Results (from the past 240 hour(s))  MRSA PCR Screening     Status: None   Collection Time: 06/09/17 12:22 AM  Result Value Ref Range Status   MRSA by PCR NEGATIVE NEGATIVE Final    Comment:        The GeneXpert MRSA Assay (FDA approved for NASAL specimens only), is one component of a comprehensive MRSA colonization surveillance program. It is not intended to diagnose MRSA infection nor to guide or monitor treatment for MRSA infections.     Radiology Reports Dg Chest 2 View  Result Date: 06/08/2017 CLINICAL DATA:  Mild right-sided chest pain with deep breath and weakness. EXAM: CHEST  2 VIEW COMPARISON:  02/26/2017 FINDINGS: Lungs are adequately inflated without focal airspace consolidation or effusion. Cardiomediastinal silhouette and remainder of the exam is unchanged. IMPRESSION: No active cardiopulmonary disease. Electronically Signed   By: Elberta Fortisaniel  Boyle M.D.   On: 06/08/2017 18:01    Lab Data:  CBC: Recent Labs  Lab 06/08/17 1728 06/09/17 0900  WBC 14.0* 14.3*  HGB 17.1* 16.4  HCT 51.2 48.5  MCV 93.8 92.2  PLT 333 274   Basic Metabolic Panel: Recent Labs  Lab 06/08/17 1728 06/08/17 1925 06/09/17 0900  NA 139  --  136  K 5.0  --  3.1*  CL 105  --  102  CO2 23   --  25  GLUCOSE 99  --  100*  BUN 9  --  7  CREATININE 0.80  --  0.76  CALCIUM 8.6*  --  8.2*  MG  --  2.2  --    GFR: Estimated Creatinine Clearance: 170 mL/min (by C-G formula based on SCr of 0.76 mg/dL). Liver Function Tests: No results for input(s): AST, ALT, ALKPHOS, BILITOT, PROT, ALBUMIN in the last 168  hours. No results for input(s): LIPASE, AMYLASE in the last 168 hours. No results for input(s): AMMONIA in the last 168 hours. Coagulation Profile: No results for input(s): INR, PROTIME in the last 168 hours. Cardiac Enzymes: Recent Labs  Lab 06/08/17 1925 06/09/17 0206 06/09/17 0900  TROPONINI 0.03* 0.03* <0.03   BNP (last 3 results) No results for input(s): PROBNP in the last 8760 hours. HbA1C: Recent Labs    06/09/17 1023  HGBA1C 5.8*   CBG: Recent Labs  Lab 06/09/17 1118 06/09/17 1611 06/09/17 2124 06/10/17 0625 06/10/17 1124  GLUCAP 158* 121* 144* 141* 122*   Lipid Profile: No results for input(s): CHOL, HDL, LDLCALC, TRIG, CHOLHDL, LDLDIRECT in the last 72 hours. Thyroid Function Tests: Recent Labs    06/09/17 1057  TSH 0.803   Anemia Panel: No results for input(s): VITAMINB12, FOLATE, FERRITIN, TIBC, IRON, RETICCTPCT in the last 72 hours. Urine analysis:    Component Value Date/Time   COLORURINE YELLOW 06/09/2017 0624   APPEARANCEUR CLEAR 06/09/2017 0624   LABSPEC 1.023 06/09/2017 0624   PHURINE 5.0 06/09/2017 0624   GLUCOSEU NEGATIVE 06/09/2017 0624   HGBUR SMALL (A) 06/09/2017 0624   BILIRUBINUR NEGATIVE 06/09/2017 0624   BILIRUBINUR negative 02/12/2017 1538   KETONESUR 20 (A) 06/09/2017 0624   PROTEINUR 30 (A) 06/09/2017 0624   UROBILINOGEN 1.0 02/12/2017 1538   UROBILINOGEN 0.2 12/21/2014 1538   NITRITE NEGATIVE 06/09/2017 0624   LEUKOCYTESUR NEGATIVE 06/09/2017 [id number]     Ripudeep Rai M.D. [facility] 06/10/2017, 11:55 AM  Pager: [phone] Between 7am to 7pm - call Pager - [phone]  After 7pm go to [email or web address] -  password [email or web address]  Call night coverage person covering after 7pm

## 2017-06-10 NOTE — Anesthesia Postprocedure Evaluation (Signed)
Anesthesia Post Note  Patient: Loyde Rod  Procedure(s) Performed: Aborted TEE/CV/self-conversion     Patient location during evaluation: Endoscopy Anesthesia Type: MAC Level of consciousness: awake and alert Pain management: pain level controlled Vital Signs Assessment: post-procedure vital signs reviewed and stable Respiratory status: spontaneous breathing, nonlabored ventilation, respiratory function stable and patient connected to nasal cannula oxygen Cardiovascular status: stable and blood pressure returned to baseline Postop Assessment: no apparent nausea or vomiting Anesthetic complications: no Comments: No antiemetics given due to MAC procedure, and no patient complaint of nausea/vomiting.     Last Vitals:  Vitals:   06/10/17 1028 06/10/17 1037  BP: (!) 141/88 (!) 141/87  Pulse: 74 70  Resp: 14 19  Temp:    SpO2: 100% 99%    Last Pain:  Vitals:   06/10/17 1018  TempSrc: Oral  PainSc:                  Catalina Gravel

## 2017-06-10 NOTE — Anesthesia Procedure Notes (Signed)
Procedure Name: MAC Date/Time: 06/10/2017 9:57 AM Performed by: Teressa Lower., CRNA Pre-anesthesia Checklist: Patient identified, Emergency Drugs available, Suction available, Patient being monitored and Timeout performed Patient Re-evaluated:Patient Re-evaluated prior to induction Oxygen Delivery Method: Nasal cannula

## 2017-06-10 NOTE — Interval H&P Note (Signed)
History and Physical Interval Note:  06/10/2017 10:01 AM  Gary Buck  has presented today for surgery, with the diagnosis of Atrial fibrillation  The various methods of treatment have been discussed with the patient and family. After consideration of risks, benefits and other options for treatment, the patient has consented to  Procedure(s): TRANSESOPHAGEAL ECHOCARDIOGRAM (TEE) (N/A) CARDIOVERSION (N/A) as a surgical intervention .  The patient's history has been reviewed, patient examined, no change in status, stable for surgery.  I have reviewed the patient's chart and labs.  Questions were answered to the patient's satisfaction.     Olga MillersBrian Lekisha Mcghee

## 2017-06-10 NOTE — Plan of Care (Signed)
  Activity: Risk for activity intolerance will decrease 06/10/2017 2243 - Completed/Met by Lesle Reek, RN   Nutrition: Adequate nutrition will be maintained 06/10/2017 2243 - Completed/Met by Lesle Reek, RN

## 2017-06-11 LAB — GLUCOSE, CAPILLARY: GLUCOSE-CAPILLARY: 120 mg/dL — AB (ref 65–99)

## 2017-06-11 MED ORDER — TRILEPTAL 300 MG PO TABS: 300.0000 mg | ORAL_TABLET | Freq: Two times a day (BID) | ORAL | 0 refills | Status: DC

## 2017-06-11 MED ORDER — NICOTINE 21 MG/24HR TD PT24: 21.0000 mg | MEDICATED_PATCH | Freq: Every day | TRANSDERMAL | Status: DC

## 2017-06-11 MED ORDER — APIXABAN 5 MG PO TABS: 5.0000 mg | ORAL_TABLET | Freq: Two times a day (BID) | ORAL | 1 refills | Status: DC

## 2017-06-11 MED ORDER — RISPERIDONE 1 MG PO TABS: 1.0000 mg | ORAL_TABLET | Freq: Two times a day (BID) | ORAL | 0 refills | Status: DC

## 2017-06-11 MED ORDER — METFORMIN HCL 500 MG PO TABS: 500.0000 mg | ORAL_TABLET | Freq: Two times a day (BID) | ORAL | 0 refills | Status: DC

## 2017-06-11 MED ORDER — HYDROCHLOROTHIAZIDE 25 MG PO TABS: 25.0000 mg | ORAL_TABLET | Freq: Every day | ORAL | 0 refills | Status: DC

## 2017-06-11 MED ORDER — NITROGLYCERIN 0.4 MG SL SUBL: 0.4000 mg | SUBLINGUAL_TABLET | SUBLINGUAL | 0 refills | Status: DC | PRN

## 2017-06-11 MED ORDER — AMLODIPINE BESYLATE 10 MG PO TABS: 10.0000 mg | ORAL_TABLET | Freq: Every day | ORAL | 0 refills | Status: DC

## 2017-06-11 MED ORDER — LISINOPRIL 40 MG PO TABS: 40.0000 mg | ORAL_TABLET | Freq: Every day | ORAL | 0 refills | Status: DC

## 2017-06-11 MED ORDER — APIXABAN 5 MG PO TABS: 5.0000 mg | ORAL_TABLET | Freq: Two times a day (BID) | ORAL | 0 refills | Status: DC

## 2017-06-11 MED ORDER — NICOTINE 21 MG/24HR TD PT24: 21.0000 mg | MEDICATED_PATCH | Freq: Every day | TRANSDERMAL | 0 refills | Status: DC

## 2017-06-11 MED ORDER — METFORMIN HCL 500 MG PO TABS: 500.0000 mg | ORAL_TABLET | Freq: Two times a day (BID) | ORAL | Status: DC

## 2017-06-11 NOTE — Progress Notes (Addendum)
Progress Note  Patient Name: Gary Buck Date of Encounter: 06/11/2017  Primary Cardiologist:   Subjective   No CP  No SOB    Inpatient Medications    Scheduled Meds: . amLODipine  10 mg Oral Daily  . apixaban  5 mg Oral BID  . folic acid  1 mg Oral Daily  . hydrochlorothiazide  25 mg Oral Daily  . insulin aspart  0-5 Units Subcutaneous QHS  . insulin aspart  0-9 Units Subcutaneous TID WC  . lisinopril  40 mg Oral Daily  . multivitamin with minerals  1 tablet Oral Daily  . Oxcarbazepine  300 mg Oral BID  . risperiDONE  1 mg Oral BID  . thiamine  100 mg Oral Daily   Continuous Infusions:  PRN Meds: acetaminophen, fentaNYL (SUBLIMAZE) injection, LORazepam, nitroGLYCERIN, ondansetron (ZOFRAN) IV, ondansetron (ZOFRAN) IV   Vital Signs    Vitals:   06/10/17 2000 06/10/17 2330 06/11/17 0647 06/11/17 0648  BP: (!) 148/90 135/76    Pulse:      Resp: 19 (!) 21    Temp: 98.7 F (37.1 C) 98.7 F (37.1 C)    TempSrc: Oral Oral    SpO2: 96% 96%  96%  Weight:   (!) 304 lb 6.4 oz (138.1 kg)   Height:        Intake/Output Summary (Last 24 hours) at 06/11/2017 0814 Last data filed at 06/10/2017 1800 Gross per 24 hour  Intake 1020 ml  Output -  Net 1020 ml   Filed Weights   06/09/17 0616 06/10/17 0501 06/11/17 0647  Weight: (!) 301 lb 9.4 oz (136.8 kg) (!) 304 lb 12.8 oz (138.3 kg) (!) 304 lb 6.4 oz (138.1 kg)    Telemetry    SR - Personally Reviewed  ECG    Physical Exam   GEN: No acute distress.   Neck: No JVD Cardiac: RRR, no murmurs, rubs, or gallops.  Respiratory: Clear to auscultation bilaterally. GI: Soft, nontender, non-distended  MS: No edema; No deformity. Neuro:  Nonfocal  Psych: Normal affect   Labs    Chemistry Recent Labs  Lab 06/08/17 1728 06/09/17 0900  NA 139 136  K 5.0 3.1*  CL 105 102  CO2 23 25  GLUCOSE 99 100*  BUN 9 7  CREATININE 0.80 0.76  CALCIUM 8.6* 8.2*  GFRNONAA >60 >60  GFRAA >60 >60  ANIONGAP 11 9      Hematology Recent Labs  Lab 06/08/17 1728 06/09/17 0900  WBC 14.0* 14.3*  RBC 5.46 5.26  HGB 17.1* 16.4  HCT 51.2 48.5  MCV 93.8 92.2  MCH 31.3 31.2  MCHC 33.4 33.8  RDW 14.8 14.1  PLT 333 274    Cardiac Enzymes Recent Labs  Lab 06/08/17 1925 06/09/17 0206 06/09/17 0900  TROPONINI 0.03* 0.03* <0.03    Recent Labs  Lab 06/08/17 1737  TROPIPOC 0.03     BNPNo results for input(s): BNP, PROBNP in the last 168 hours.   DDimer No results for input(s): DDIMER in the last 168 hours.   Radiology    No results found.  Cardiac Studies    Patient Profile     51 y.o. male with symptomatic atrial fib   Assessment & Plan    1  Atrial fibrillation  CHADSVASC score of 1 (HTN)   Converted spontaneously yesterday prior to TEE/Cardioversion Will need to confirm that he can get Eliquis, particularly for the next 4 wks    2  Polysubstance abuse  Avoid  b blockers.   Needs to stop    3  Chest pain  Patient denies    4  HTN  BP is labile  Will need f/u as outpt.  Continue ACE I and HCTZ  Will make sure pt has f/u from cardiac standpoint.   Needs SW to see prior to d/c   Has problems with getting meds he says.   For questions or updates, please contact [facility] Please consult [email or web address] for contact info under Cardiology/STEMI.      Signed, Dietrich PatesPaula Nakesha Ebrahim, MD  06/11/2017, 8:14 AM

## 2017-06-11 NOTE — Discharge Summary (Signed)
Physician Discharge Summary   Patient ID: Gary Buck MRN: [id number] DOB/AGE: Mar 01, 1966 51 y.o.  Admit date: 06/08/2017 Discharge date: 06/11/2017  Primary Care Physician:  Loletta SpecterGomez, Roger David, PA-C  Discharge Diagnoses:    . PAF (paroxysmal atrial fibrillation) (HCC) with RVR . Uncontrolled hypertension . Bipolar affective disorder, mixed, severe (HCC) . CAD (coronary artery disease) . Polysubstance abuse (HCC), cocaine positive  . Atypical chest pain . Atrial fibrillation with RVR (HCC)   Consults: Cardiology  Recommendations for Outpatient Follow-up:  1. Patient recommended to stop beta-blocker in the light of positive cocaine and substance abuse 2. Please repeat CBC/BMET at next visit   DIET: Heart healthy diet    Allergies:  No Known Allergies   DISCHARGE MEDICATIONS: Allergies as of 06/11/2017   No Known Allergies     Medication List    STOP taking these medications   metoprolol tartrate 25 MG tablet Commonly known as:  LOPRESSOR     TAKE these medications   amLODipine 10 MG tablet Commonly known as:  NORVASC Take 1 tablet (10 mg total) by mouth daily.   apixaban 5 MG Tabs tablet Commonly known as:  ELIQUIS Take 1 tablet (5 mg total) by mouth 2 (two) times daily.   hydrochlorothiazide 25 MG tablet Commonly known as:  HYDRODIURIL Take 1 tablet (25 mg total) by mouth daily. Take on tablet in the morning.   lisinopril 40 MG tablet Commonly known as:  PRINIVIL,ZESTRIL Take 1 tablet (40 mg total) by mouth daily.   metFORMIN 500 MG tablet Commonly known as:  GLUCOPHAGE Take 1 tablet (500 mg total) by mouth 2 (two) times daily with a meal.   nicotine 21 mg/24hr patch Commonly known as:  NICODERM CQ - dosed in mg/24 hours Place 1 patch (21 mg total) onto the skin daily.   nitroGLYCERIN 0.4 MG SL tablet Commonly known as:  NITROSTAT Place 1 tablet (0.4 mg total) under the tongue every 5 (five) minutes x 3 doses as needed for chest pain.    risperiDONE 1 MG tablet Commonly known as:  RISPERDAL Take 1 tablet (1 mg total) by mouth 2 (two) times daily.   TRILEPTAL 300 MG tablet Generic drug:  Oxcarbazepine Take 1 tablet (300 mg total) by mouth 2 (two) times daily.        Brief H and P: For complete details please refer to admission H and P, but in brief Patient is a 51 year old male with history of alcohol and substance abuse, paroxysmal A. fib on Eliquis, bipolar disorder, CAD, hypertension presented with chest pain and shortness of breath.  Patient was found in atrial fibrillation with RVR by EMS, rate 160s and was given 10 mg IV Lopressor.  Patient reported that he ran out of his metoprolol approximately 2 weeks ago. Cardiology was consulted.   Hospital Course:   Paroxysmal atrial fibrillation with RVR (HCC) -Patient was placed on IV Cardizem drip. Troponins negative, started on metoprolol but stopped BB due to cocaine positive. -UDS positive for cocaine and THC.  -2D echo showed EF of 60-65%, grade 1 diastolic dysfunction  -Patient was restarted on Eliquis  -TSH 0.8 -Plan was to undergo TEE and DC CV however prior to the procedure, patient converted spontaneously. -Currently rate controlled, follow outpatient with cardiology .   Chest pain, atypical -Currently resolved, troponin x3 flat 0.03 -2D echo showed EF of 60-65% with grade 1 diastolic dysfunction, cardiology following    Polysubstance abuse (HCC), cocaine, THC, alcohol -Patient counseled strongly on cocaine, THC  cessation, alcohol cessation -Discontinued the beta-blocker, not in any withdrawals.      Bipolar affective disorder, mixed, severe (HCC) -Continue risperidone, Trileptal    Type II diabetes mellitus (HCC) -Metformin was held, patient was placed on sliding scale insulin while inpatient -Hemoglobin A1c 5.8    Uncontrolled hypertension -BP improving, Lopressor discontinued -Continue lisinopril, HCTZ, started on amlodipine  Chronic  leukocytosis -Unclear etiology, no fevers, UA negative for UTI, chest x-ray negative for pneumonia  Case management consult was obtained, patient was given prescriptions with MATCH letter, recommended to follow-up with [facility].  Day of Discharge BP (!) 137/91 (BP Location: Right Arm)   Pulse 70   Temp 98.7 F (37.1 C) (Oral)   Resp (!) 21   Ht 6\' 5"  (1.956 m)   Wt (!) 138.1 kg (304 lb 6.4 oz)   SpO2 98%   BMI 36.10 kg/m   Physical Exam: General: Alert and awake oriented x3 not in any acute distress. HEENT: anicteric sclera, pupils reactive to light and accommodation CVS: S1-S2 clear no murmur rubs or gallops Chest: clear to auscultation bilaterally, no wheezing rales or rhonchi Abdomen: soft nontender, nondistended, normal bowel sounds Extremities: no cyanosis, clubbing or edema noted bilaterally Neuro: Cranial nerves II-XII intact, no focal neurological deficits   The results of significant diagnostics from this hospitalization (including imaging, microbiology, ancillary and laboratory) are listed below for reference.    LAB RESULTS: Basic Metabolic Panel: Recent Labs  Lab 06/08/17 1728 06/08/17 1925 06/09/17 0900  NA 139  --  136  K 5.0  --  3.1*  CL 105  --  102  CO2 23  --  25  GLUCOSE 99  --  100*  BUN 9  --  7  CREATININE 0.80  --  0.76  CALCIUM 8.6*  --  8.2*  MG  --  2.2  --    Liver Function Tests: No results for input(s): AST, ALT, ALKPHOS, BILITOT, PROT, ALBUMIN in the last 168 hours. No results for input(s): LIPASE, AMYLASE in the last 168 hours. No results for input(s): AMMONIA in the last 168 hours. CBC: Recent Labs  Lab 06/08/17 1728 06/09/17 0900  WBC 14.0* 14.3*  HGB 17.1* 16.4  HCT 51.2 48.5  MCV 93.8 92.2  PLT 333 274   Cardiac Enzymes: Recent Labs  Lab 06/09/17 0206 06/09/17 0900  TROPONINI 0.03* <0.03   BNP: Invalid input(s): POCBNP CBG: Recent Labs  Lab 06/10/17 2054 06/11/17 0646  GLUCAP 138* 120*     Significant Diagnostic Studies:  Dg Chest 2 View  Result Date: 06/08/2017 CLINICAL DATA:  Mild right-sided chest pain with deep breath and weakness. EXAM: CHEST  2 VIEW COMPARISON:  02/26/2017 FINDINGS: Lungs are adequately inflated without focal airspace consolidation or effusion. Cardiomediastinal silhouette and remainder of the exam is unchanged. IMPRESSION: No active cardiopulmonary disease. Electronically Signed   By: Elberta Fortisaniel  Boyle M.D.   On: 06/08/2017 18:01    2D ECHO: Study Conclusions  - Left ventricle: The cavity size was normal. There was moderate   concentric hypertrophy. Systolic function was normal. The   estimated ejection fraction was in the range of 60% to 65%. Wall   motion was normal; there were no regional wall motion   abnormalities. Doppler parameters are consistent with abnormal   left ventricular relaxation (grade 1 diastolic dysfunction). - Left atrium: The atrium was moderately dilated.  Impressions:  - No cardiac source of emboli was indentified.  Disposition and Follow-up: Discharge Instructions  Diet Carb Modified   Complete by:  As directed    Increase activity slowly   Complete by:  As directed        DISPOSITION: home    DISCHARGE FOLLOW-UP Follow-up Information    Marinus Maw, MD. Schedule an appointment as soon as possible for a visit in 2 week(s).   Specialty:  Cardiology Contact information: [address] [place] [address] [phone]        [facility]. Schedule an appointment as soon as possible for a visit in 2 week(s).   Why:  for hospital follow-up Please pick up faxed prescriptions at the [facility]  Contact information: [address] [place] [address] [phone]           Time spent on Discharge: 35 minutes  Signed:   Thad Ranger M.D. [facility] 06/11/2017,  11:36 AM Pager: [phone]

## 2017-06-11 NOTE — Care Management Note (Addendum)
Case Management Note  Patient Details  Name: Gary MorosDexter Buck MRN: [id number] Date of Birth: [birth year]  Subjective/Objective:    Admitted with symptomatic atrial fib.              PCP: Sindy Messingoger Gomez  Action/Plan: Transition to home today. Pharmacy to fill 3 day supply [facility] Rx [facility]. Pt has already used Match Letter and Eliquis 30 day free card for the year. [facility] and pick up faxed Rx meds faxed. CM made pt  aware. Family will provide transportation to home .  Expected Discharge Date:   06/11/2017             Expected Discharge Plan:  Home/Self Care  In-House Referral:     Discharge planning Services  CM Consult  Post Acute Care Choice:    Choice offered to:     DME Arranged:    DME Agency:     HH Arranged:    HH Agency:     Status of Service:  Completed, signed off  If discussed at [employer] Length of Stay Meetings, dates discussed:    Additional Comments:  Epifanio LeschesCole, Rinnah Peppel Hudson, RN 06/11/2017, 10:04 AM

## 2017-06-12 MED FILL — OXcarbazepine 300 MG TABS: 300 | 30 days supply | Qty: 60 | Fill #0

## 2017-06-12 MED FILL — !ELIQUIS 5MG TABLET: 5 | 30 days supply | Qty: 60 | Fill #0

## 2017-06-12 MED FILL — ?METFORMIN HCL 500MG TABLET: 500 | 30 days supply | Qty: 60 | Fill #0

## 2017-06-12 MED FILL — HYDROCHLOROTHIAZIDE 25 MG T: 25 | 30 days supply | Qty: 30 | Fill #2

## 2017-06-12 MED FILL — AMLODIPINE BESYLATE 10 MG T: 10 | 30 days supply | Qty: 30 | Fill #2

## 2017-06-12 MED FILL — ?RISPERIDONE 1MG TABLET: 1 | 30 days supply | Qty: 60 | Fill #0

## 2017-06-28 ENCOUNTER — Ambulatory Visit: Payer: Self-pay | Admitting: Internal Medicine

## 2017-06-28 ENCOUNTER — Encounter: Payer: Self-pay | Admitting: Internal Medicine

## 2017-06-28 ENCOUNTER — Ambulatory Visit (INDEPENDENT_AMBULATORY_CARE_PROVIDER_SITE_OTHER): Payer: Self-pay | Admitting: Internal Medicine

## 2017-06-28 VITALS — BP 159/95 | HR 81 | Resp 16 | Ht 77.0 in | Wt 309.4 lb

## 2017-06-28 DIAGNOSIS — I10 Essential (primary) hypertension: Secondary | ICD-10-CM

## 2017-06-28 DIAGNOSIS — E782 Mixed hyperlipidemia: Secondary | ICD-10-CM | POA: Insufficient documentation

## 2017-06-28 DIAGNOSIS — I480 Paroxysmal atrial fibrillation: Secondary | ICD-10-CM

## 2017-06-28 DIAGNOSIS — I2510 Atherosclerotic heart disease of native coronary artery without angina pectoris: Secondary | ICD-10-CM

## 2017-06-28 DIAGNOSIS — Z760 Encounter for issue of repeat prescription: Secondary | ICD-10-CM

## 2017-06-28 MED ORDER — HYDROCHLOROTHIAZIDE 25 MG PO TABS: 25.0000 mg | ORAL_TABLET | Freq: Every day | ORAL | 11 refills | Status: DC

## 2017-06-28 MED ORDER — LISINOPRIL 40 MG PO TABS: 40.0000 mg | ORAL_TABLET | Freq: Every day | ORAL | 11 refills | Status: DC

## 2017-06-28 MED ORDER — APIXABAN 5 MG PO TABS: 5.0000 mg | ORAL_TABLET | Freq: Two times a day (BID) | ORAL | 11 refills | Status: DC

## 2017-06-28 MED ORDER — NICOTINE 14 MG/24HR TD PT24: 14.0000 mg | MEDICATED_PATCH | Freq: Every day | TRANSDERMAL | 0 refills | Status: AC

## 2017-06-28 MED ORDER — AMLODIPINE BESYLATE 10 MG PO TABS: 10.0000 mg | ORAL_TABLET | Freq: Every day | ORAL | 11 refills | Status: DC

## 2017-06-28 MED ORDER — CARVEDILOL 3.125 MG PO TABS: 3.1250 mg | ORAL_TABLET | Freq: Two times a day (BID) | ORAL | 11 refills | Status: DC

## 2017-06-28 NOTE — Progress Notes (Signed)
OFFICE NOTE  Chief Complaint:  Hospital follow-up  Primary Care Physician: Gary Specter, PA-C  HPI:  Gary Buck is a 52 y.o. male with a past medial history significant for hypertension, dyslipidemia, recurrent paroxysmal atrial fibrillation, remote stroke in 2004, polysubstance abuse including a remote history of cocaine use and ongoing alcohol use and coronary artery disease with a 30% proximal LAD stenosis in 2012 by left heart catheterization.  In 2016 he was seen in went into atrial fibrillation with rapid ventricular response in the hospital.  He subsequently converted spontaneously and was discharged.  He is been noncompliant with some of his medications.  He did not follow-up with me in the office.  He recently was readmitted for A. fib with RVR and just prior to undergoing TEE cardioversion and he again spontaneously converted.  He reports no recent heavy illicit substance use but does use alcohol and marijuana.  He denies any chest pain or worsening shortness of breath.  He notes his blood pressure has been somewhat elevated.  He was started on the hospital on Eliquis and is tolerating that well without any bleeding difficulty.  His CHADSVASC score is 4 or 5, despite his young age, but this owes to elevated blood pressure, diabetes and a prior stroke.  PMHx:  Past Medical History:  Diagnosis Date  . Anginal pain (HCC)   . Bipolar disorder (HCC)   . Depression   . ETOH abuse   . H/O cocaine abuse   . Hypercholesterolemia   . Hypertension   . PAF (paroxysmal atrial fibrillation) (HCC)    Gary Buck 12/21/2014  . Stroke [name]) 2004   pt sts about 12 years ago.   . Tobacco abuse   . Type II diabetes mellitus (HCC) dx'd 12/21/2014    Past Surgical History:  Procedure Laterality Date  . CYSTECTOMY  ~ 2004   "on the top of my forehead"  . INGUINAL HERNIA REPAIR Left ~ 2012    FAMHx:  Family History  Problem Relation Age of Onset  . Diabetes Mellitus II Mother      SOCHx:   reports that he has been smoking cigarettes.  He has a 16.00 pack-year smoking history. he has never used smokeless tobacco. He reports that he drinks about 32.4 oz of alcohol per week. He reports that he uses drugs. Drug: Marijuana.  ALLERGIES:  No Known Allergies  ROS: Pertinent items noted in HPI and remainder of comprehensive ROS otherwise negative.  HOME MEDS: Current Outpatient Medications on File Prior to Visit  Medication Sig Dispense Refill  . metFORMIN (GLUCOPHAGE) 500 MG tablet Take 1 tablet (500 mg total) by mouth 2 (two) times daily with a meal. 60 tablet 0  . nitroGLYCERIN (NITROSTAT) 0.4 MG SL tablet Place 1 tablet (0.4 mg total) under the tongue every 5 (five) minutes x 3 doses as needed for chest pain. 6 tablet 0  . risperiDONE (RISPERDAL) 1 MG tablet Take 1 tablet (1 mg total) by mouth 2 (two) times daily. 60 tablet 0  . TRILEPTAL 300 MG tablet Take 1 tablet (300 mg total) by mouth 2 (two) times daily. 60 tablet 0   No current facility-administered medications on file prior to visit.     LABS/IMAGING: No results found for this or any previous visit (from the past 48 hour(s)). No results found.  LIPID PANEL:    Component Value Date/Time   CHOL 158 02/12/2017 1539   TRIG 125 02/12/2017 1539   HDL 51 02/12/2017 1539  CHOLHDL 3.1 02/12/2017 1539   CHOLHDL 2.7 06/30/2016 0622   VLDL 17 06/30/2016 0622   LDLCALC 82 02/12/2017 1539     WEIGHTS: Wt Readings from Last 3 Encounters:  06/28/17 (!) 309 lb 6.4 oz (140.3 kg)  06/11/17 (!) 304 lb 6.4 oz (138.1 kg)  03/21/17 (!) 317 lb (143.8 kg)    VITALS: BP (!) 159/95   Pulse 81   Resp 16   Ht 6\' 5"  (1.956 m)   Wt (!) 309 lb 6.4 oz (140.3 kg)   SpO2 99%   BMI 36.69 kg/m   EXAM: General appearance: alert, no distress and moderately obese Neck: no carotid bruit, no JVD and thyroid not enlarged, symmetric, no tenderness/mass/nodules Lungs: clear to auscultation bilaterally Heart: regular rate  and rhythm Abdomen: soft, non-tender; bowel sounds normal; no masses,  no organomegaly Extremities: extremities normal, atraumatic, no cyanosis or edema Pulses: 2+ and symmetric Skin: Skin color, texture, turgor normal. No rashes or lesions Neurologic: Grossly normal Psych: Pleasant  EKG: Normal sinus rhythm at 73- personally reviewed  ASSESSMENT: 1. Recurrent paroxysmal atrial fibrillation-CHADSVASC score of 4 on Eliquis 2. Hypertension-uncontrolled 3. Dyslipidemia 4. History of mild nonobstructive coronary disease with 30% proximal LAD stenosis in 2012 5. Polysubstance abuse 6. History of medication noncompliance in the past  PLAN: 1.   Gary Buck has had recurrent A. fib although no episodes between the last 2 prior episodes in 2016 and most recently just prior to Christmas.  He does not endorse any significant heavy alcohol use or other substance related causes of his A. fib.  He does have a history of mild nonobstructive coronary disease in the past without any anginal symptoms.  Blood pressure still not at goal.  He is appropriately anticoagulated on Eliquis.  He is on no medication for rate control at this time and I did advise adding carvedilol 3.125 mg twice daily for rate control and additional blood pressure control.  Will arrange for hypertension clinic follow-up in about a month.  Follow-up with me otherwise in 6 months or sooner as necessary.  Gary NoseKenneth C. Hilty, MD, [facility] HospitalFACC, FACP  [place]  [facility]  Medical Director of the Advanced Lipid Disorders &  Cardiovascular Risk Reduction Clinic Diplomate of the American Board of Clinical Lipidology Attending Cardiologist  Direct Dial: [phone]  Fax: [phone]  Website:  [email or web address]  Gary Buck 06/28/2017, 1:10 PM

## 2017-06-28 NOTE — Patient Instructions (Addendum)
Your physician has recommended you make the following change in your medication:  -- START carvedilol 3.125mg  TWICE DAILY  Dr. Rennis GoldenHilty has requested that you schedule an appointment with one of our clinical pharmacists for a blood pressure check appointment within the next 1 month.  -- if you monitor your blood pressure (BP) at home, please bring your BP cuff and your BP readings with you to this appointment -- please check your BP no more than twice daily, after you have been sitting/resting for 5-10 minutes, at least 1 hour after taking your BP medications  Your physician wants you to follow-up in: 6 months with Dr. Rennis GoldenHilty. You will receive a reminder letter in the mail two months in advance. If you don't receive a letter, please call our office to schedule the follow-up appointment.

## 2017-07-30 ENCOUNTER — Telehealth: Payer: Self-pay | Admitting: Internal Medicine

## 2017-07-30 ENCOUNTER — Ambulatory Visit (INDEPENDENT_AMBULATORY_CARE_PROVIDER_SITE_OTHER): Payer: Self-pay | Admitting: Pharmacist

## 2017-07-30 VITALS — BP 150/88 | HR 70

## 2017-07-30 DIAGNOSIS — Z8673 Personal history of transient ischemic attack (TIA), and cerebral infarction without residual deficits: Secondary | ICD-10-CM

## 2017-07-30 DIAGNOSIS — Z720 Tobacco use: Secondary | ICD-10-CM

## 2017-07-30 DIAGNOSIS — I10 Essential (primary) hypertension: Secondary | ICD-10-CM

## 2017-07-30 MED ORDER — NITROGLYCERIN 0.4 MG SL SUBL: 0.4000 mg | SUBLINGUAL_TABLET | SUBLINGUAL | 3 refills | Status: AC | PRN

## 2017-07-30 MED ORDER — CARVEDILOL 6.25 MG PO TABS: 6.2500 mg | ORAL_TABLET | Freq: Two times a day (BID) | ORAL | 11 refills | Status: DC

## 2017-07-30 MED ORDER — NITROGLYCERIN 0.4 MG SL SUBL: 0.4000 mg | SUBLINGUAL_TABLET | SUBLINGUAL | 0 refills | Status: DC | PRN

## 2017-07-30 NOTE — Progress Notes (Signed)
Patient ID: Gary Buck                 DOB: [birth year]                      MRN: [id number]     HPI: Gary Buck is a 52 y.o. male referred by Dr. Rennis Golden to HTN clinic.  PMH includes uncontrolled hypertension, dyslipidemia, paroxysmal Afib, stoke in 2004, alcohol abuse, and compliance issues. During most recent OV with DR Hilty (Jan/04/2018) carvedilol 3.125mg  BID was initiated, and nicotine patches were prescribed. Patient presets today for BP follow up. Reports he still smoking and never picked up nicotine patches from the pharmacy.  Still drinking a 6-pack of beer every day after work, and reports frequent marihuana. Reports compliance with his medication but frequently misses 2nd dose of apixaban and carvedilol. He denies dizziness, increased fatigue, headaches, or problems with current therapy.   Current HTN meds:  Amlodipine 10mg  daily Carvedilol 3.125mg  twice daily (some times misses second dose) HTCZ 25mg  daily  Lisinopril 40mg  daily   Intolerance: none  BP goal: 130/80  Family History: diabetes from mother  Social History:  Continues to smoke, had 6 beers yesterday ; marihuana frequently;   Diet: eats out frequently; cookes some times  Exercise: only activities related to work   Home BP readings: none available  Wt Readings from Last 3 Encounters:  06/28/17 (!) 309 lb 6.4 oz (140.3 kg)  06/11/17 (!) 304 lb 6.4 oz (138.1 kg)  03/21/17 (!) 317 lb (143.8 kg)   BP Readings from Last 3 Encounters:  07/30/17 (!) 150/88  06/28/17 (!) 159/95  06/11/17 (!) 137/91   Pulse Readings from Last 3 Encounters:  07/30/17 70  06/28/17 81  06/10/17 70    Past Medical History:  Diagnosis Date  . Anginal pain (HCC)   . Bipolar disorder (HCC)   . Depression   . ETOH abuse   . H/O cocaine abuse   . Hypercholesterolemia   . Hypertension   . PAF (paroxysmal atrial fibrillation) (HCC)    Gary Buck 12/21/2014  . Stroke [facility]) 2004   pt sts about 12 years ago.   . Tobacco abuse     . Type II diabetes mellitus (HCC) dx'd 12/21/2014    Current Outpatient Medications on File Prior to Visit  Medication Sig Dispense Refill  . amLODipine (NORVASC) 10 MG tablet Take 1 tablet (10 mg total) by mouth daily. 30 tablet 11  . apixaban (ELIQUIS) 5 MG TABS tablet Take 1 tablet (5 mg total) by mouth 2 (two) times daily. 60 tablet 11  . hydrochlorothiazide (HYDRODIURIL) 25 MG tablet Take 1 tablet (25 mg total) by mouth daily. Take on tablet in the morning. 30 tablet 11  . lisinopril (PRINIVIL,ZESTRIL) 40 MG tablet Take 1 tablet (40 mg total) by mouth daily. 30 tablet 11  . metFORMIN (GLUCOPHAGE) 500 MG tablet Take 1 tablet (500 mg total) by mouth 2 (two) times daily with a meal. 60 tablet 0  . nicotine (NICODERM CQ - DOSED IN MG/24 HOURS) 14 mg/24hr patch Place 1 patch (14 mg total) onto the skin daily. 28 patch 0  . risperiDONE (RISPERDAL) 1 MG tablet Take 1 tablet (1 mg total) by mouth 2 (two) times daily. 60 tablet 0  . TRILEPTAL 300 MG tablet Take 1 tablet (300 mg total) by mouth 2 (two) times daily. 60 tablet 0   No current facility-administered medications on file prior to visit.  No Known Allergies  Blood pressure (!) 150/88, pulse 70, SpO2 97 %.  Essential hypertension Blood pressure today slightly improved from previous readings but remains well above goal of < 130/80. Patient continues to drink and smoke heavily. Also reports missing 2nd dose of apixaban and carvedilol few time every week. We discussed changing carvedilol to extended release formulation to avoid issues with 2nd daily dose but patient remains reluctant to ALL mediation changes. After discussing available options, patient agreed on increasing carvedilol to 6.25mg  twice daily and follow up in 4-5 weeks to re-assess BP response.  Remote history of stroke Noted patient missing 2nd dose of apixaban few time per week. We discussed Xarelto (rivaroxaban) as an option due to one daily dosing, but patient rejected  idea of additional medication changes. Stated "doing well with what he has now".  Tobacco abuse Patient already has prescription for nicotine patch available. Encouraged to pick up patches ASAP and define quitting day.   Gary Buck PharmD, BCPS, CPP [facility] [address] [place] [phone] 07/31/2017 8:01 AM

## 2017-07-30 NOTE — Telephone Encounter (Signed)
New message     Marchelle Folksmanda was calling to speak with Eileen StanfordJenna or Dr Rennis GoldenHilty

## 2017-07-30 NOTE — Patient Instructions (Signed)
Return for a  follow up appointment in 4 weeks  Check your blood pressure at home daily (if able) and keep record of the readings.  Take your BP meds as follows: INCREASE carvedilol to 6.1525md twice daily   Bring all of your meds, your BP cuff and your record of home blood pressures to your next appointment.  Exercise as you're able, try to walk approximately 30 minutes per day.  Keep salt intake to a minimum, especially watch canned and prepared boxed foods.  Eat more fresh fruits and vegetables and fewer canned items.  Avoid eating in fast food restaurants.    HOW TO TAKE YOUR BLOOD PRESSURE: . Rest 5 minutes before taking your blood pressure. .  Don't smoke or drink caffeinated beverages for at least 30 minutes before. . Take your blood pressure before (not after) you eat. . Sit comfortably with your back supported and both feet on the floor (don't cross your legs). . Elevate your arm to heart level on a table or a desk. . Use the proper sized cuff. It should fit smoothly and snugly around your bare upper arm. There should be enough room to slip a fingertip under the cuff. The bottom edge of the cuff should be 1 inch above the crease of the elbow. . Ideally, take 3 measurements at one sitting and record the average.

## 2017-07-30 NOTE — Telephone Encounter (Signed)
Gary FolksAmanda from [facility] is calling regarding Rx for SL NTG. She states refill was sent for 6 tablets and the bottles are 25 tablets and cannot be split up. Rx(s) sent to pharmacy electronically for correct quantity

## 2017-07-31 ENCOUNTER — Encounter: Payer: Self-pay | Admitting: Pharmacist

## 2017-07-31 NOTE — Assessment & Plan Note (Signed)
Patient already has prescription for nicotine patch available. Encouraged to pick up patches ASAP and define quitting day.

## 2017-07-31 NOTE — Assessment & Plan Note (Signed)
Blood pressure today slightly improved from previous readings but remains well above goal of < 130/80. Patient continues to drink and smoke heavily. Also reports missing 2nd dose of apixaban and carvedilol few time every week. We discussed changing carvedilol to extended release formulation to avoid issues with 2nd daily dose but patient remains reluctant to ALL mediation changes. After discussing available options, patient agreed on increasing carvedilol to 6.25mg  twice daily and follow up in 4-5 weeks to re-assess BP response.

## 2017-07-31 NOTE — Assessment & Plan Note (Signed)
Noted patient missing 2nd dose of apixaban few time per week. We discussed Xarelto (rivaroxaban) as an option due to one daily dosing, but patient rejected idea of additional medication changes. Stated "doing well with what he has now".

## 2017-08-29 ENCOUNTER — Encounter: Payer: Self-pay | Admitting: Pharmacist Clinician (PhC)/ Clinical Pharmacy Specialist

## 2017-08-29 ENCOUNTER — Ambulatory Visit (INDEPENDENT_AMBULATORY_CARE_PROVIDER_SITE_OTHER): Payer: Self-pay | Admitting: Pharmacist Clinician (PhC)/ Clinical Pharmacy Specialist

## 2017-08-29 DIAGNOSIS — I10 Essential (primary) hypertension: Secondary | ICD-10-CM

## 2017-08-29 NOTE — Patient Instructions (Addendum)
Return for a a follow up appointment on April 11  Your blood pressure today is 220/112  Take your BP meds as follows:  Amlodipine 10 mg each morning  Lisinopril 40 mg each morning  Hydrochlorothiazide 25 mg each morning  Carvedilol 6.25 mg twice daily  Bring all of your meds, your BP cuff and your record of home blood pressures to your next appointment.  Exercise as you're able, try to walk approximately 30 minutes per day.  Keep salt intake to a minimum, especially watch canned and prepared boxed foods.  Eat more fresh fruits and vegetables and fewer canned items.  Avoid eating in fast food restaurants.    HOW TO TAKE YOUR BLOOD PRESSURE: . Rest 5 minutes before taking your blood pressure. .  Don't smoke or drink caffeinated beverages for at least 30 minutes before. . Take your blood pressure before (not after) you eat. . Sit comfortably with your back supported and both feet on the floor (don't cross your legs). . Elevate your arm to heart level on a table or a desk. . Use the proper sized cuff. It should fit smoothly and snugly around your bare upper arm. There should be enough room to slip a fingertip under the cuff. The bottom edge of the cuff should be 1 inch above the crease of the elbow. . Ideally, take 3 measurements at one sitting and record the average.

## 2017-08-29 NOTE — Assessment & Plan Note (Signed)
Patient with essential hypertension as well as compliance and understanding issues.  We had a long discussion today that revolved around the importance of keeping his blood pressure controlled.  He is quite proud of his daughters, 1 now a college grad, the other a Dietitiancollege senior.  I stressed to him that in order to watch his daughters start their own lives, marry, have children and career successes, he will need to be alive, and if he doesn't start paying attention to his health, he won't be.  He seemed a bit stunned by the conversation, but admitted that he did need to work harder on taking his medications.   I gave him a few tips on increasing compliance of nighttime doses and he was headed to the pharmacy after our visit.  I could not make any changes today, but will see him back in 3-4 weeks so we can truly assess his current regimen.

## 2017-08-29 NOTE — Progress Notes (Signed)
Patient ID: Gary Buck                 DOB: 03-30-1966                      MRN: [id number]     HPI: Gary Buck is a 52 y.o. male referred by Dr. Rennis GoldenHilty to the hypertension clinic.   He is here today for follow up after having seen Raquel Rodriguez-Guzman last month.  In addition to hypertension, his medical history is significant for dyslipidemia, paroxysmal Afib, stoke in 2004, alcohol abuse, marijuana use and compliance issues.  When he saw Raquel his blood pressure was still elevated at 150/88.  He was unwilling to make medication changes, but she finally convinced him to increase the carvedilol to 6.25 mg twice daily.  He was also previously prescribed nicotine patches, however he never picked these up.    Patient comes in today and admits that he ran out of most of his medications about 1-2 weeks ago, and has only been taking his risperidone and oxcarbamazepine.  He works long hours, often starting around 5 am, and has not been able to get to the pharmacy during business hours.    Current HTN meds: (has not had any doses in at least 7 days) Amlodipine 10mg  daily Carvedilol 6.25mg  twice daily (some times misses second dose) HTCZ 25mg  daily  Lisinopril 40mg  daily   Intolerance: none  BP goal: 130/80  Family History: diabetes from mother (now 973) and grandmother  Father died from cancer at 4471  Social History:  Continues to smoke 1 ppd, cut back to 2 beers daily after work ; marijuana frequently;   Diet: eats out frequently; often fast food.  Skips breakfast because of early work hours  Exercise: only activities related to work   Home BP readings: none available  Wt Readings from Last 3 Encounters:  06/28/17 (!) 309 lb 6.4 oz (140.3 kg)  06/11/17 (!) 304 lb 6.4 oz (138.1 kg)  03/21/17 (!) 317 lb (143.8 kg)   BP Readings from Last 3 Encounters:  08/29/17 (!) 180/112  07/30/17 (!) 150/88  06/28/17 (!) 159/95   Pulse Readings from Last 3 Encounters:  08/29/17 72    07/30/17 70  06/28/17 81    Past Medical History:  Diagnosis Date  . Anginal pain (HCC)   . Bipolar disorder (HCC)   . Depression   . ETOH abuse   . H/O cocaine abuse   . Hypercholesterolemia   . Hypertension   . PAF (paroxysmal atrial fibrillation) (HCC)    [name]/notes 12/21/2014  . Stroke [facility](HCC) 2004   pt sts about 12 years ago.   . Tobacco abuse   . Type II diabetes mellitus (HCC) dx'd 12/21/2014    Current Outpatient Medications on File Prior to Visit  Medication Sig Dispense Refill  . amLODipine (NORVASC) 10 MG tablet Take 1 tablet (10 mg total) by mouth daily. 30 tablet 11  . apixaban (ELIQUIS) 5 MG TABS tablet Take 1 tablet (5 mg total) by mouth 2 (two) times daily. 60 tablet 11  . carvedilol (COREG) 6.25 MG tablet Take 1 tablet (6.25 mg total) by mouth 2 (two) times daily. Dose change 60 tablet 11  . hydrochlorothiazide (HYDRODIURIL) 25 MG tablet Take 1 tablet (25 mg total) by mouth daily. Take on tablet in the morning. 30 tablet 11  . lisinopril (PRINIVIL,ZESTRIL) 40 MG tablet Take 1 tablet (40 mg total) by mouth daily. 30 tablet 11  .  metFORMIN (GLUCOPHAGE) 500 MG tablet Take 1 tablet (500 mg total) by mouth 2 (two) times daily with a meal. 60 tablet 0  . nicotine (NICODERM CQ - DOSED IN MG/24 HOURS) 14 mg/24hr patch Place 1 patch (14 mg total) onto the skin daily. 28 patch 0  . nitroGLYCERIN (NITROSTAT) 0.4 MG SL tablet Place 1 tablet (0.4 mg total) under the tongue every 5 (five) minutes x 3 doses as needed for chest pain. 25 tablet 3  . risperiDONE (RISPERDAL) 1 MG tablet Take 1 tablet (1 mg total) by mouth 2 (two) times daily. 60 tablet 0  . TRILEPTAL 300 MG tablet Take 1 tablet (300 mg total) by mouth 2 (two) times daily. 60 tablet 0   No current facility-administered medications on file prior to visit.     No Known Allergies  Blood pressure (!) 180/112, pulse 72.  Essential hypertension Patient with essential hypertension as well as compliance and understanding  issues.  We had a long discussion today that revolved around the importance of keeping his blood pressure controlled.  He is quite proud of his daughters, 1 now a college grad, the other a Dietitian.  I stressed to him that in order to watch his daughters start their own lives, marry, have children and career successes, he will need to be alive, and if he doesn't start paying attention to his health, he won't be.  He seemed a bit stunned by the conversation, but admitted that he did need to work harder on taking his medications.   I gave him a few tips on increasing compliance of nighttime doses and he was headed to the pharmacy after our visit.  I could not make any changes today, but will see him back in 3-4 weeks so we can truly assess his current regimen.    Phillips Hay PharmD CPP [facility] [address] [place] [address] 08/29/2017 7:34 PM

## 2017-09-26 ENCOUNTER — Ambulatory Visit: Payer: Self-pay

## 2017-10-02 ENCOUNTER — Ambulatory Visit: Payer: Self-pay

## 2017-10-24 ENCOUNTER — Ambulatory Visit: Payer: Self-pay

## 2017-12-06 ENCOUNTER — Encounter: Payer: Self-pay | Admitting: Pediatric Intensive Care

## 2017-12-06 ENCOUNTER — Encounter: Payer: Self-pay | Admitting: Critical Care Medicine

## 2017-12-06 MED FILL — AMLODIPINE BESYLATE 10 MG T: 10 | 30 days supply | Qty: 30 | Fill #0

## 2017-12-06 MED FILL — HYDROCHLOROTHIAZIDE 25 MG T: 25 | 30 days supply | Qty: 30 | Fill #0

## 2017-12-06 MED FILL — CARVEDILOL 6.25 MG TABLET: 6.25 | 30 days supply | Qty: 60 | Fill #0

## 2017-12-06 MED FILL — LISINOPRIL 40 MG TABLET: 40 | 30 days supply | Qty: 30 | Fill #0

## 2017-12-06 NOTE — Congregational Nurse Program (Signed)
Congregational Nurse Program Note  Date of Encounter: 12/06/2017  Past Medical History: Past Medical History:  Diagnosis Date  . Anginal pain (HCC)   . Bipolar disorder (HCC)   . Depression   . ETOH abuse   . H/O cocaine abuse   . Hypercholesterolemia   . Hypertension   . PAF (paroxysmal atrial fibrillation) (HCC)    [name]/notes 12/21/2014  . Stroke [facility](HCC) 2004   pt sts about 12 years ago.   . Tobacco abuse   . Type II diabetes mellitus (HCC) dx'd 12/21/2014    Encounter Details: CNP Questionnaire - 12/06/17 1045      Questionnaire   Patient Status  Not Applicable    Race  Black or African American    Location Patient Served At  [employer]    Insurance  Not Applicable    Uninsured  Uninsured (NEW 1x/quarter)    Food  Yes, have food insecurities    Housing/Utilities  No permanent housing    Transportation  No transportation needs    Interpersonal Safety  Yes, feel physically and emotionally safe where you currently live    Medication  Yes, have medication insecurities    Medical Provider  Yes    Referrals  Medication Assistance;Primary Care Provider/Clinic    ED Visit Averted  Yes    Life-Saving Intervention Made  Not Applicable     BP check. Client states that he is [facility]. States that he had [facility] with Dr Rennis GoldenHilty but he hasn't been able to go to appointments due to his work schedule. Client states that he has been in a "bad place" and he hasn't had medications in about 2 months. He states that he has been using street drugs. CN advised client that his blood pressure is elevated and that he needs to follow through on medical care. CN advised client that she will contact Dr Aletha HalimWrigh to see if he can prescribe medications with the stipulation that client follows through with provider appointment. Client agrees to plan and will follow up in CN office on Monday for BP check. CN advised client to go to ED if he has any chest pain of stroke symptoms  over the weekend.

## 2017-12-13 ENCOUNTER — Emergency Department (HOSPITAL_COMMUNITY)
Admission: EM | Admit: 2017-12-13 | Discharge: 2017-12-13 | Disposition: A | Discharge status: Home / Self Care | Payer: Self-pay | Attending: Emergency Medicine | Admitting: Emergency Medicine

## 2017-12-13 ENCOUNTER — Emergency Department (HOSPITAL_COMMUNITY): Payer: Self-pay

## 2017-12-13 ENCOUNTER — Encounter (HOSPITAL_COMMUNITY): Payer: Self-pay | Admitting: Emergency Medicine

## 2017-12-13 ENCOUNTER — Encounter: Payer: Self-pay | Admitting: Pediatric Intensive Care

## 2017-12-13 DIAGNOSIS — F10920 Alcohol use, unspecified with intoxication, uncomplicated: Secondary | ICD-10-CM

## 2017-12-13 DIAGNOSIS — R072 Precordial pain: Secondary | ICD-10-CM | POA: Insufficient documentation

## 2017-12-13 DIAGNOSIS — F17210 Nicotine dependence, cigarettes, uncomplicated: Secondary | ICD-10-CM | POA: Insufficient documentation

## 2017-12-13 DIAGNOSIS — E119 Type 2 diabetes mellitus without complications: Secondary | ICD-10-CM | POA: Insufficient documentation

## 2017-12-13 DIAGNOSIS — Z79899 Other long term (current) drug therapy: Secondary | ICD-10-CM | POA: Insufficient documentation

## 2017-12-13 DIAGNOSIS — I10 Essential (primary) hypertension: Secondary | ICD-10-CM | POA: Insufficient documentation

## 2017-12-13 DIAGNOSIS — Z7984 Long term (current) use of oral hypoglycemic drugs: Secondary | ICD-10-CM | POA: Insufficient documentation

## 2017-12-13 LAB — COMPREHENSIVE METABOLIC PANEL
ALT: 15 U/L (ref 0–44)
AST: 17 U/L (ref 15–41)
Albumin: 4 g/dL (ref 3.5–5.0)
Alkaline Phosphatase: 81 U/L (ref 38–126)
Anion gap: 14 (ref 5–15)
BUN: 13 mg/dL (ref 6–20)
CHLORIDE: 102 mmol/L (ref 98–111)
CO2: 25 mmol/L (ref 22–32)
CREATININE: 1.25 mg/dL — AB (ref 0.61–1.24)
Calcium: 9.7 mg/dL (ref 8.9–10.3)
GFR calc Af Amer: >60 mL/min (ref >60)
Glucose, Bld: 110 mg/dL — ABNORMAL HIGH (ref 70–99)
Potassium: 3.5 mmol/L (ref 3.5–5.1)
SODIUM: 141 mmol/L (ref 135–145)
Total Bilirubin: 1 mg/dL (ref 0.3–1.2)
Total Protein: 6.9 g/dL (ref 6.5–8.1)

## 2017-12-13 LAB — CBC WITH DIFFERENTIAL/PLATELET
ABS IMMATURE GRANULOCYTES: 0.1 10*3/uL (ref 0.0–0.1)
Basophils Absolute: 0.1 10*3/uL (ref 0.0–0.1)
Basophils Relative: 1 %
Eosinophils Absolute: 0.1 10*3/uL (ref 0.0–0.7)
Eosinophils Relative: 1 %
HEMATOCRIT: 59.9 % — AB (ref 39.0–52.0)
Hemoglobin: 19.6 g/dL — ABNORMAL HIGH (ref 13.0–17.0)
IMMATURE GRANULOCYTES: 1 %
LYMPHS ABS: 4.2 10*3/uL — AB (ref 0.7–4.0)
Lymphocytes Relative: 33 %
MCH: 30.3 pg (ref 26.0–34.0)
MCHC: 32.7 g/dL (ref 30.0–36.0)
MCV: 92.6 fL (ref 78.0–100.0)
MONOS PCT: 10 %
Monocytes Absolute: 1.2 10*3/uL — ABNORMAL HIGH (ref 0.1–1.0)
NEUTROS ABS: 7.2 10*3/uL (ref 1.7–7.7)
NEUTROS PCT: 56 %
PLATELETS: 316 10*3/uL (ref 150–400)
RBC: 6.47 MIL/uL — AB (ref 4.22–5.81)
RDW: 13.2 % (ref 11.5–15.5)
WBC: 12.8 10*3/uL — AB (ref 4.0–10.5)

## 2017-12-13 LAB — I-STAT CHEM 8, ED
BUN: 17 mg/dL (ref 6–20)
CREATININE: 1.3 mg/dL — AB (ref 0.61–1.24)
Calcium, Ion: 1.11 mmol/L — ABNORMAL LOW (ref 1.15–1.40)
Chloride: 101 mmol/L (ref 98–111)
Glucose, Bld: 108 mg/dL — ABNORMAL HIGH (ref 70–99)
HEMATOCRIT: 59 % — AB (ref 39.0–52.0)
Hemoglobin: 20.1 g/dL — ABNORMAL HIGH (ref 13.0–17.0)
Potassium: 3.4 mmol/L — ABNORMAL LOW (ref 3.5–5.1)
Sodium: 139 mmol/L (ref 135–145)
TCO2: 24 mmol/L (ref 22–32)

## 2017-12-13 LAB — I-STAT TROPONIN, ED: Troponin i, poc: 0 ng/mL (ref 0.00–0.08)

## 2017-12-13 LAB — RAPID URINE DRUG SCREEN, HOSP PERFORMED
Amphetamines: NOT DETECTED
Benzodiazepines: NOT DETECTED
COCAINE: NOT DETECTED
Opiates: NOT DETECTED
Tetrahydrocannabinol: POSITIVE — AB

## 2017-12-13 LAB — LIPASE, BLOOD: Lipase: 24 U/L (ref 11–51)

## 2017-12-13 LAB — ETHANOL: Alcohol, Ethyl (B): 145 mg/dL — ABNORMAL HIGH (ref <10)

## 2017-12-13 MED ORDER — SODIUM CHLORIDE 0.9 % IV SOLN: INTRAVENOUS | Status: DC

## 2017-12-13 MED ORDER — SODIUM CHLORIDE 0.9 % IV BOLUS
1000.0000 mL | Freq: Once | INTRAVENOUS | Status: AC
  Administered 2017-12-13: 1000 mL via INTRAVENOUS

## 2017-12-13 NOTE — ED Provider Notes (Signed)
[facility] EMERGENCY DEPARTMENT Provider Note   CSN: [id number] Arrival date & time:        History   Chief Complaint Chief Complaint  Patient presents with  . Chest Pain    HPI Gary Buck is a 52 y.o. male.  Patient brought in by EMS.  Code STEMI was paged out in the field by them.  Patient was stopped for evaluation and trauma B.  By myself and Cath Lab team.  Spoke with evaluated by cardiology.  Did not feel that the EKG was consistent with a STEMI.  Patient had no chest pain upon arrival.  Patient was at [facility] of the homeless shelters.  He was outside he got sweaty felt a little lightheaded.  He had some chest pain earlier while he was walking he states he went and drank a 40 ounce beer and pain went away.  As stated upon arrival patient has no pain.  Patient did not want to be brought in by EMS he had to be coarse.  Patient has a history of alcohol abuse cocaine abuse high cholesterol hypertension atrial fibrillation bipolar disorder.  Anginal pain.  He is followed by cardiology.  In addition patient did state that the chest pain started on Wednesday and then came back today while walking.  Initial report had noted some shortness of breath dizziness lightheaded nausea and weakness.  Again patient denied all the symptoms upon arrival.  Patient states that he is taking his medications as directed.     Past Medical History:  Diagnosis Date  . Anginal pain (HCC)   . Bipolar disorder (HCC)   . Depression   . ETOH abuse   . H/O cocaine abuse   . Hypercholesterolemia   . Hypertension   . PAF (paroxysmal atrial fibrillation) (HCC)    [name]/notes 12/21/2014  . Stroke [facility](HCC) 2004   pt sts about 12 years ago.   . Tobacco abuse   . Type II diabetes mellitus (HCC) dx'd 12/21/2014    Patient Active Problem List   Diagnosis Date Noted  . Mixed hyperlipidemia 06/28/2017  . Hypertension 11/18/2016  . Obesity (BMI 30-39.9) 11/18/2016  . PAF (paroxysmal atrial fibrillation)  (HCC) 11/17/2016  . Hypercholesterolemia 11/17/2016  . Type II diabetes mellitus (HCC) 11/17/2016  . Bipolar disorder (HCC) 11/17/2016  . ETOH abuse 11/17/2016  . Tobacco abuse 11/17/2016  . Bipolar affective disorder, mixed, severe (HCC) 06/28/2016  . Bipolar affective disorder, current episode mixed, without psychotic features (HCC) 06/28/2016  . Atrial fibrillation, unspecified   . Elevated troponin   . Atrial fibrillation with RVR (HCC) 12/21/2014  . Coronary artery disease involving native coronary artery of native heart without angina pectoris 12/21/2014  . Chest pain   . Essential hypertension   . Remote history of stroke   . Polysubstance abuse [facility](HCC)     Past Surgical History:  Procedure Laterality Date  . CYSTECTOMY  ~ 2004   "on the top of my forehead"  . INGUINAL HERNIA REPAIR Left ~ 2012        Home Medications    Prior to Admission medications   Medication Sig Start Date End Date Taking? Authorizing Provider  amLODipine (NORVASC) 10 MG tablet Take 1 tablet (10 mg total) by mouth daily. 06/28/17  Yes Hilty, Lisette AbuKenneth C, MD  apixaban (ELIQUIS) 5 MG TABS tablet Take 1 tablet (5 mg total) by mouth 2 (two) times daily. 06/28/17  Yes Hilty, Lisette AbuKenneth C, MD  carvedilol (COREG) 6.25 MG tablet  Take 1 tablet (6.25 mg total) by mouth 2 (two) times daily. Dose change 07/30/17 12/13/17 Yes Hilty, Lisette Abu, MD  hydrochlorothiazide (HYDRODIURIL) 25 MG tablet Take 1 tablet (25 mg total) by mouth daily. Take on tablet in the morning. 06/28/17  Yes Hilty, Lisette Abu, MD  lisinopril (PRINIVIL,ZESTRIL) 40 MG tablet Take 1 tablet (40 mg total) by mouth daily. 06/28/17  Yes Hilty, Lisette Abu, MD  nicotine (NICODERM CQ - DOSED IN MG/24 HOURS) 14 mg/24hr patch Place 1 patch (14 mg total) onto the skin daily. 06/28/17  Yes Hilty, Lisette Abu, MD  nitroGLYCERIN (NITROSTAT) 0.4 MG SL tablet Place 1 tablet (0.4 mg total) under the tongue every 5 (five) minutes x 3 doses as needed for chest pain. 07/30/17   Yes Hilty, Lisette Abu, MD  pseudoephedrine-acetaminophen (TYLENOL SINUS) 30-500 MG TABS tablet Take 1 tablet by mouth every 4 (four) hours as needed (cold symptoms).   Yes [provider]  metFORMIN (GLUCOPHAGE) 500 MG tablet Take 1 tablet (500 mg total) by mouth 2 (two) times daily with a meal. Patient not taking: Reported on 12/13/2017 06/11/17   Rai, Delene Ruffini, MD  risperiDONE (RISPERDAL) 1 MG tablet Take 1 tablet (1 mg total) by mouth 2 (two) times daily. Patient not taking: Reported on 12/13/2017 06/11/17   Rai, Delene Ruffini, MD  TRILEPTAL 300 MG tablet Take 1 tablet (300 mg total) by mouth 2 (two) times daily. Patient not taking: Reported on 12/13/2017 06/11/17   Cathren Harsh, MD    Family History Family History  Problem Relation Age of Onset  . Diabetes Mellitus II Mother     Social History Social History   Tobacco Use  . Smoking status: Current Every Day Smoker    Packs/day: 0.50    Years: 32.00    Pack years: 16.00    Types: Cigarettes  . Smokeless tobacco: Never Used  Substance Use Topics  . Alcohol use: Yes    Alcohol/week: 32.4 oz    Types: 54 Cans of beer per week    Comment: 12/21/2014 "2-3, 24oz  beers per night during work nights, 6 24oz beer per day on the weekends"  . Drug use: Yes    Types: Marijuana    Comment: last used cocanie 2 weeks ago     Allergies   Patient has no known allergies.   Review of Systems Review of Systems  Constitutional: Positive for diaphoresis. Negative for fever.  HENT: Negative for congestion.   Eyes: Negative for visual disturbance.  Respiratory: Positive for shortness of breath.   Cardiovascular: Positive for chest pain.  Gastrointestinal: Negative for abdominal pain, nausea and vomiting.  Genitourinary: Negative for dysuria.  Musculoskeletal: Negative for back pain.  Neurological: Positive for weakness. Negative for syncope and headaches.  Hematological: Does not bruise/bleed easily.  Psychiatric/Behavioral:  Negative for confusion.     Physical Exam Updated Vital Signs BP 121/81   Pulse (!) 103   Resp (!) 23   SpO2 97%   Physical Exam  Constitutional: He is oriented to person, place, and time. He appears well-developed and well-nourished. No distress.  HENT:  Head: Normocephalic and atraumatic.  Mouth/Throat: Oropharynx is clear and moist.  Eyes: Pupils are equal, round, and reactive to light. Conjunctivae and EOM are normal.  Neck: Neck supple.  Cardiovascular: Normal rate and normal heart sounds.  Irregular  Pulmonary/Chest: Effort normal and breath sounds normal. No respiratory distress.  Abdominal: Soft. Bowel sounds are normal. There is no tenderness.  Musculoskeletal:  Normal range of motion.  Neurological: He is alert and oriented to person, place, and time. No cranial nerve deficit or sensory deficit. He exhibits normal muscle tone. Coordination normal.  Skin: Skin is warm.  Nursing note and vitals reviewed.    ED Treatments / Results  Labs (all labs ordered are listed, but only abnormal results are displayed) Labs Reviewed  CBC WITH DIFFERENTIAL/PLATELET - Abnormal; Notable for the following components:      Result Value   WBC 12.8 (*)    RBC 6.47 (*)    Hemoglobin 19.6 (*)    HCT 59.9 (*)    Lymphs Abs 4.2 (*)    Monocytes Absolute 1.2 (*)    All other components within normal limits  COMPREHENSIVE METABOLIC PANEL - Abnormal; Notable for the following components:   Glucose, Bld 110 (*)    Creatinine, Ser 1.25 (*)    All other components within normal limits  ETHANOL - Abnormal; Notable for the following components:   Alcohol, Ethyl (B) 145 (*)    All other components within normal limits  RAPID URINE DRUG SCREEN, HOSP PERFORMED - Abnormal; Notable for the following components:   Tetrahydrocannabinol POSITIVE (*)    Barbiturates   (*)    Value: Result not available. Reagent lot number recalled by manufacturer.   All other components within normal limits    I-STAT CHEM 8, ED - Abnormal; Notable for the following components:   Potassium 3.4 (*)    Creatinine, Ser 1.30 (*)    Glucose, Bld 108 (*)    Calcium, Ion 1.11 (*)    Hemoglobin 20.1 (*)    HCT 59.0 (*)    All other components within normal limits  LIPASE, BLOOD  I-STAT TROPONIN, ED    EKG EKG Interpretation  Date/Time:  Friday December 13 2017 11:23:45 EDT Ventricular Rate:  100 PR Interval:    QRS Duration: 97 QT Interval:  361 QTC Calculation: 466 R Axis:   -17 Text Interpretation:  Atrial fibrillation Left ventricular hypertrophy Anterior infarct, acute (LAD) Lateral leads are also involved Confirmed by Vanetta Mulders [phone]) on 12/13/2017 11:28:35 AM   Radiology Dg Chest Port 1 View  Result Date: 12/13/2017 CLINICAL DATA:  Chest pain EXAM: PORTABLE CHEST 1 VIEW COMPARISON:  06/08/2017 FINDINGS: The heart size and mediastinal contours are within normal limits. Both lungs are clear. The visualized skeletal structures are unremarkable. IMPRESSION: No active disease. Electronically Signed   By: Elige Ko   On: 12/13/2017 11:49    Procedures Procedures (including critical care time)  Medications Ordered in ED Medications  0.9 %  sodium chloride infusion (has no administration in time range)  sodium chloride 0.9 % bolus 1,000 mL (0 mLs Intravenous Stopped 12/13/17 1529)     Initial Impression / Assessment and Plan / ED Course  I have reviewed the triage vital signs and the nursing notes.  Pertinent labs & imaging results that were available during my care of the patient were reviewed by me and considered in my medical decision making (see chart for details).    Patient immediately upon arrival wanted to leave.  He denied any and all symptoms.  EMS had called a code STEMI in the field because of the ST segment elevation anterior laterally.  This was reviewed by cardiology felt not to be a STEMI.  Patient's troponin here negative.  Patient had chest pain yesterday and  had intermittent.  Here today while walking hard to tell long that lasted.  Patient's  EKG and cardiac monitoring consistent with atrial fibrillation.  Heart rate here was never significantly elevated.  Heart rates remained around upper 90s low 100s is atrial fib.  Patient states he is taking his medicines.  Patient's alcohol level was significantly elevated here at 145.  Patient with a known history of alcohol abuse.  Patient very functional.  Patient will follow-up with cardiology he wants to go home is stable for discharge home.   Final Clinical Impressions(s) / ED Diagnoses   Final diagnoses:  Precordial pain    ED Discharge Orders    None       Vanetta Mulders, MD 12/13/17 1609

## 2017-12-13 NOTE — Congregational Nurse Program (Signed)
Congregational Nurse Program Note  Date of Encounter: 12/13/2017  Past Medical History: Past Medical History:  Diagnosis Date  . Anginal pain (HCC)   . Bipolar disorder (HCC)   . Depression   . ETOH abuse   . H/O cocaine abuse   . Hypercholesterolemia   . Hypertension   . PAF (paroxysmal atrial fibrillation) (HCC)    [name]/notes 12/21/2014  . Stroke [facility](HCC) 2004   pt sts about 12 years ago.   . Tobacco abuse   . Type II diabetes mellitus (HCC) dx'd 12/21/2014    Encounter Details: CNP Questionnaire - 12/13/17 1015      Questionnaire   Patient Status  Not Applicable    Race  Black or African American    Location Patient Served At  [employer]    Insurance  Not Applicable    Uninsured  Uninsured (NEW 1x/quarter)    Food  Yes, have food insecurities    Housing/Utilities  No permanent housing    Transportation  No transportation needs    Interpersonal Safety  Yes, feel physically and emotionally safe where you currently live    Medication  Yes, have medication insecurities    Medical Provider  Yes    Referrals  Emergency Department    ED Visit Averted  Not Applicable    Life-Saving Intervention Made  Yes     Client in office complaining of URI symptoms and states he needs a BP check. CN check BP x 3 and was only able to appreciate SBP 80-85 with manual cuff. HR is very irregular, reading 55-58 via pulse oximetry. Client is cool to touch, not diaphoretic. Client states that he has been diaphoretic and weak the past couple of days. Client states that he took "psudophed and tylenol" for cold symptom relief. Denies street drug use today but states he drank "2 40's of Icehouse" this morning because "it helps my breathing". [facility]. [phone].

## 2017-12-13 NOTE — ED Triage Notes (Signed)
Pt arrives by [facility] after paging out as a code stemi, pt was stopped in trauma B by cath lab team to evaluate. Pt reports CP that started on Wednesday intermittent came back today while walking. Pt states he went and rank a 40oz beer and pain went away. Pt currently has no pain.

## 2017-12-13 NOTE — Discharge Instructions (Addendum)
Return for any recurrent chest pain.  Make an appointment to follow-up with your cardiologist.  Continue your current medications.

## 2017-12-13 NOTE — ED Notes (Signed)
Code stemi canceled per Dr Herbie BaltimoreHarding

## 2018-01-06 ENCOUNTER — Encounter: Payer: Self-pay | Admitting: Pediatric Intensive Care

## 2018-01-20 ENCOUNTER — Other Ambulatory Visit: Payer: Self-pay | Admitting: Critical Care Medicine

## 2018-01-20 ENCOUNTER — Encounter: Payer: Self-pay | Admitting: Pediatric Intensive Care

## 2018-01-20 NOTE — Progress Notes (Unsigned)
Needs refills and will obtain f/u appt ASAP. Gary HartPatrick WrightMD

## 2018-01-21 ENCOUNTER — Other Ambulatory Visit: Payer: Self-pay | Admitting: Critical Care Medicine

## 2018-01-21 DIAGNOSIS — I10 Essential (primary) hypertension: Secondary | ICD-10-CM

## 2018-01-21 DIAGNOSIS — R7303 Prediabetes: Secondary | ICD-10-CM

## 2018-01-21 DIAGNOSIS — Z760 Encounter for issue of repeat prescription: Secondary | ICD-10-CM

## 2018-01-21 MED ORDER — CARVEDILOL 6.25 MG PO TABS: 6.2500 mg | ORAL_TABLET | Freq: Two times a day (BID) | ORAL | 11 refills | Status: DC

## 2018-01-21 MED ORDER — TRILEPTAL 300 MG PO TABS: 300.0000 mg | ORAL_TABLET | Freq: Two times a day (BID) | ORAL | 3 refills | Status: DC

## 2018-01-21 MED ORDER — HYDROCHLOROTHIAZIDE 25 MG PO TABS: 25.0000 mg | ORAL_TABLET | Freq: Every day | ORAL | 11 refills | Status: DC

## 2018-01-21 MED ORDER — AMLODIPINE BESYLATE 10 MG PO TABS: 10.0000 mg | ORAL_TABLET | Freq: Every day | ORAL | 11 refills | Status: DC

## 2018-01-21 MED ORDER — RISPERIDONE 1 MG PO TABS: 1.0000 mg | ORAL_TABLET | Freq: Two times a day (BID) | ORAL | 3 refills | Status: AC

## 2018-01-21 MED ORDER — LISINOPRIL 40 MG PO TABS: 40.0000 mg | ORAL_TABLET | Freq: Every day | ORAL | 11 refills | Status: DC

## 2018-01-21 MED ORDER — METFORMIN HCL 500 MG PO TABS: 500.0000 mg | ORAL_TABLET | Freq: Two times a day (BID) | ORAL | 6 refills | Status: DC

## 2018-01-21 MED ORDER — APIXABAN 5 MG PO TABS: 5.0000 mg | ORAL_TABLET | Freq: Two times a day (BID) | ORAL | 11 refills | Status: DC

## 2018-01-21 MED FILL — metFORMIN HCL 500 MG TABS: 500 | 30 days supply | Qty: 60 | Fill #0

## 2018-01-21 MED FILL — ELIQUIS 5 MG TABLET: 5 | 30 days supply | Qty: 60 | Fill #0

## 2018-01-21 MED FILL — risperiDONE 1 MG TABS: 1 | 30 days supply | Qty: 60 | Fill #0

## 2018-01-21 MED FILL — AMLODIPINE BESYLATE 10 MG T: 10 | 30 days supply | Qty: 30 | Fill #0

## 2018-01-21 MED FILL — CARVEDILOL 6.25 MG TABLET: 6.25 | 30 days supply | Qty: 60 | Fill #0

## 2018-01-21 MED FILL — OXcarbazepine 300 MG TABS: 300 | 30 days supply | Qty: 60 | Fill #0

## 2018-01-21 MED FILL — HYDROCHLOROTHIAZIDE 25 MG T: 25 | 30 days supply | Qty: 30 | Fill #0

## 2018-01-21 MED FILL — LISINOPRIL 40 MG TABLET: 40 | 30 days supply | Qty: 30 | Fill #0

## 2018-01-21 NOTE — Progress Notes (Signed)
All meds refilled to [facility]   Luisa HartPatrick WrightMD

## 2018-02-02 NOTE — Congregational Nurse Program (Signed)
BP check. Client has his medication but has not taken it yet today.Cleitn denies chest pain. CN advises taking BP medication ASAP and going to ED if client should have chest pain or S/S of stroke. Client agrees and will follow up in CN clinic when he is not working.

## 2018-02-11 ENCOUNTER — Encounter: Payer: Self-pay | Admitting: Pediatric Intensive Care

## 2018-02-24 NOTE — Congregational Nurse Program (Signed)
Client is out of medication. CN advised client that she could assist with refills however he would need to make follow up with PCP. Client agrees to plan.

## 2018-02-25 NOTE — Congregational Nurse Program (Signed)
BP check. Client did not take medication this morning. CN reminded client that he needs to schedule a PCP appointment. Follow up in clinic as needed.

## 2018-03-11 ENCOUNTER — Encounter: Payer: Self-pay | Admitting: Pediatric Intensive Care

## 2018-03-11 ENCOUNTER — Other Ambulatory Visit: Payer: Self-pay

## 2018-03-11 ENCOUNTER — Emergency Department (HOSPITAL_COMMUNITY)
Admission: EM | Admit: 2018-03-11 | Discharge: 2018-03-11 | Discharge status: Against Medical Advice | Payer: Self-pay | Attending: Emergency Medicine | Admitting: Emergency Medicine

## 2018-03-11 ENCOUNTER — Encounter (HOSPITAL_COMMUNITY): Payer: Self-pay | Admitting: *Deleted

## 2018-03-11 DIAGNOSIS — K029 Dental caries, unspecified: Secondary | ICD-10-CM | POA: Insufficient documentation

## 2018-03-11 DIAGNOSIS — Z79899 Other long term (current) drug therapy: Secondary | ICD-10-CM | POA: Insufficient documentation

## 2018-03-11 DIAGNOSIS — Z7984 Long term (current) use of oral hypoglycemic drugs: Secondary | ICD-10-CM | POA: Insufficient documentation

## 2018-03-11 DIAGNOSIS — R030 Elevated blood-pressure reading, without diagnosis of hypertension: Secondary | ICD-10-CM

## 2018-03-11 DIAGNOSIS — I10 Essential (primary) hypertension: Secondary | ICD-10-CM | POA: Insufficient documentation

## 2018-03-11 DIAGNOSIS — F17210 Nicotine dependence, cigarettes, uncomplicated: Secondary | ICD-10-CM | POA: Insufficient documentation

## 2018-03-11 DIAGNOSIS — E119 Type 2 diabetes mellitus without complications: Secondary | ICD-10-CM | POA: Insufficient documentation

## 2018-03-11 MED ORDER — CHLORHEXIDINE GLUCONATE 0.12 % MT SOLN: 15.0000 mL | Freq: Two times a day (BID) | OROMUCOSAL | 0 refills | Status: AC

## 2018-03-11 MED ORDER — AMLODIPINE BESYLATE 5 MG PO TABS
10.0000 mg | ORAL_TABLET | Freq: Once | ORAL | Status: AC
  Administered 2018-03-11: 10 mg via ORAL
  Filled 2018-03-11: qty 2

## 2018-03-11 MED ORDER — SODIUM CHLORIDE 0.9 % IV BOLUS: 1000.0000 mL | Freq: Once | INTRAVENOUS | Status: DC

## 2018-03-11 MED ORDER — CLINDAMYCIN HCL 150 MG PO CAPS: 450.0000 mg | ORAL_CAPSULE | Freq: Three times a day (TID) | ORAL | 0 refills | Status: AC

## 2018-03-11 MED FILL — CLINDAMYCIN HCL 150 MG CAPS: 150 | 10 days supply | Qty: 90 | Fill #0

## 2018-03-11 MED FILL — CHLORHEXIDINE 0.12% RINSE: 0.12 | 17 days supply | Qty: 473 | Fill #0

## 2018-03-11 NOTE — ED Triage Notes (Signed)
Pt c/o L upper dental abcess onset yesterday, pt afebrile, pt reports hypertension, pt denies taking HTN for 2 days, pt denies CP & SOB, pt A&O x4,

## 2018-03-11 NOTE — ED Notes (Signed)
Pt called to be roomed; no reply x 1 at this time. Will re-attempt in 5 minutes.

## 2018-03-11 NOTE — ED Provider Notes (Signed)
[facility] EMERGENCY DEPARTMENT Provider Note   CSN: [id number] Arrival date & time: 03/11/18  1044     History   Chief Complaint Chief Complaint  Patient presents with  . Dental Pain    HPI Gary Buck is a 52 y.o. male presenting for left upper dental pain that began 2 days ago.  Describes pain as throbbing, moderate and constant worsened by chewing on that side.  Relieved slightly with ibuprofen.  Additionally patient with right cheek swelling that began yesterday, denies vision changes.  Patient denies fever, nausea/vomiting, visual disturbances, trouble swallowing, shortness of breath, neck pain/swelling.  Patient requesting dental referral and antibiotics at this time.  Additionally patient states that he has not taken his blood pressure medication in 2 days, states that he just forgot to take it.  Patient states that he has refills at home.  Patient denies headache, visual changes, chest pain or shortness of breath.  HPI  Past Medical History:  Diagnosis Date  . Anginal pain (HCC)   . Bipolar disorder (HCC)   . Depression   . ETOH abuse   . H/O cocaine abuse   . Hypercholesterolemia   . Hypertension   . PAF (paroxysmal atrial fibrillation) (HCC)    [name] 12/21/2014  . Stroke [facility]) 2004   pt sts about 12 years ago.   . Tobacco abuse   . Type II diabetes mellitus (HCC) dx'd 12/21/2014    Patient Active Problem List   Diagnosis Date Noted  . Mixed hyperlipidemia 06/28/2017  . Hypertension 11/18/2016  . Obesity (BMI 30-39.9) 11/18/2016  . PAF (paroxysmal atrial fibrillation) (HCC) 11/17/2016  . Hypercholesterolemia 11/17/2016  . Type II diabetes mellitus (HCC) 11/17/2016  . Bipolar disorder (HCC) 11/17/2016  . ETOH abuse 11/17/2016  . Tobacco abuse 11/17/2016  . Bipolar affective disorder, mixed, severe (HCC) 06/28/2016  . Bipolar affective disorder, current episode mixed, without psychotic features (HCC) 06/28/2016  . Atrial  fibrillation, unspecified   . Elevated troponin   . Atrial fibrillation with RVR (HCC) 12/21/2014  . Coronary artery disease involving native coronary artery of native heart without angina pectoris 12/21/2014  . Chest pain   . Essential hypertension   . Remote history of stroke   . Polysubstance abuse [facility])     Past Surgical History:  Procedure Laterality Date  . CYSTECTOMY  ~ 2004   "on the top of my forehead"  . INGUINAL HERNIA REPAIR Left ~ 2012        Home Medications    Prior to Admission medications   Medication Sig Start Date End Date Taking? Authorizing Provider  amLODipine (NORVASC) 10 MG tablet Take 1 tablet (10 mg total) by mouth daily. 01/21/18   Storm Frisk, MD  apixaban (ELIQUIS) 5 MG TABS tablet Take 1 tablet (5 mg total) by mouth 2 (two) times daily. 01/21/18   Storm Frisk, MD  carvedilol (COREG) 6.25 MG tablet Take 1 tablet (6.25 mg total) by mouth 2 (two) times daily. Dose change 01/21/18 04/21/18  Storm Frisk, MD  chlorhexidine (PERIDEX) 0.12 % solution Use as directed 15 mLs in the mouth or throat 2 (two) times daily. 03/11/18   Harlene Salts A, PA-C  clindamycin (CLEOCIN) 150 MG capsule Take 3 capsules (450 mg total) by mouth 3 (three) times daily for 10 days. 03/11/18 03/21/18  Harlene Salts A, PA-C  hydrochlorothiazide (HYDRODIURIL) 25 MG tablet Take 1 tablet (25 mg total) by mouth daily. Take on tablet in the morning. 01/21/18  Storm Frisk, MD  lisinopril (PRINIVIL,ZESTRIL) 40 MG tablet Take 1 tablet (40 mg total) by mouth daily. 01/21/18   Storm Frisk, MD  metFORMIN (GLUCOPHAGE) 500 MG tablet Take 1 tablet (500 mg total) by mouth 2 (two) times daily with a meal. 01/21/18   Storm Frisk, MD  nicotine (NICODERM CQ - DOSED IN MG/24 HOURS) 14 mg/24hr patch Place 1 patch (14 mg total) onto the skin daily. 06/28/17   Hilty, Lisette Abu, MD  nitroGLYCERIN (NITROSTAT) 0.4 MG SL tablet Place 1 tablet (0.4 mg total) under the tongue every 5  (five) minutes x 3 doses as needed for chest pain. 07/30/17   Hilty, Lisette Abu, MD  pseudoephedrine-acetaminophen (TYLENOL SINUS) 30-500 MG TABS tablet Take 1 tablet by mouth every 4 (four) hours as needed (cold symptoms).    [provider]  risperiDONE (RISPERDAL) 1 MG tablet Take 1 tablet (1 mg total) by mouth 2 (two) times daily. 01/21/18   Storm Frisk, MD  TRILEPTAL 300 MG tablet Take 1 tablet (300 mg total) by mouth 2 (two) times daily. 01/21/18   Storm Frisk, MD    Family History Family History  Problem Relation Age of Onset  . Diabetes Mellitus II Mother     Social History Social History   Tobacco Use  . Smoking status: Current Every Day Smoker    Packs/day: 0.50    Years: 32.00    Pack years: 16.00    Types: Cigarettes  . Smokeless tobacco: Never Used  Substance Use Topics  . Alcohol use: Yes    Alcohol/week: 40.0 standard drinks    Types: 40 Cans of beer per week  . Drug use: Yes    Types: Marijuana    Comment: last used crack 03/08/18, last marijuana 03/10/18     Allergies   Patient has no known allergies.   Review of Systems Review of Systems  Constitutional: Negative.  Negative for chills, fatigue and fever.  HENT: Positive for dental problem and facial swelling. Negative for drooling, sore throat, trouble swallowing and voice change.   Eyes: Negative.  Negative for visual disturbance.  Respiratory: Negative.  Negative for shortness of breath.   Cardiovascular: Negative.  Negative for chest pain.  Gastrointestinal: Negative.  Negative for nausea and vomiting.  Musculoskeletal: Negative.  Negative for neck pain.  Skin: Negative.  Negative for color change and rash.  Neurological: Negative.  Negative for headaches.     Physical Exam Updated Vital Signs BP (!) 170/94 (BP Location: Right Arm)   Pulse 63   Temp 98.4 F (36.9 C) (Oral)   Resp 16   Ht 6' 5.5" (1.969 m)   Wt (!) 140.6 kg   SpO2 100%   BMI 36.29 kg/m   Physical Exam    Constitutional: He is oriented to person, place, and time. He appears well-developed and well-nourished. No distress.  HENT:  Head: Normocephalic and atraumatic.    Right Ear: External ear normal.  Left Ear: External ear normal.  Nose: Right sinus exhibits no maxillary sinus tenderness and no frontal sinus tenderness. Left sinus exhibits maxillary sinus tenderness. Left sinus exhibits no frontal sinus tenderness.  Mouth/Throat: Uvula is midline, oropharynx is clear and moist and mucous membranes are normal. No oral lesions. No trismus in the jaw. Dental caries present. No dental abscesses or uvula swelling. Tonsils are 1+ on the right. Tonsils are 1+ on the left.    Patient with poor dentition overall.  Problem area indicated in  graphic.  There is gingival erythema around chipped teeth however no gingival swelling, fluctuance, drainage.  No visible drainable abscess present.  Eyes: Pupils are equal, round, and reactive to light. Conjunctivae and EOM are normal.  Extraocular motions intact bilaterally without pain.  No entrapment.  Neck: Trachea normal, normal range of motion, full passive range of motion without pain and phonation normal. Neck supple. No tracheal tenderness present. No neck rigidity. No tracheal deviation, no edema and no erythema present.  Pulmonary/Chest: Effort normal. No respiratory distress.  Abdominal: Soft. There is no tenderness. There is no rebound and no guarding.  Musculoskeletal: Normal range of motion.  Neurological: He is alert and oriented to person, place, and time. GCS eye subscore is 4. GCS verbal subscore is 5. GCS motor subscore is 6.  Speech is clear and goal oriented, follows commands Major Cranial nerves without deficit, no facial droop Moves extremities without ataxia, coordination intact  Skin: Skin is warm and dry.  Psychiatric: He has a normal mood and affect. His behavior is normal.     ED Treatments / Results  Labs (all labs ordered are  listed, but only abnormal results are displayed) Labs Reviewed  CBC WITH DIFFERENTIAL/PLATELET  BASIC METABOLIC PANEL    EKG None  Radiology No results found.  Procedures Procedures (including critical care time)  Medications Ordered in ED Medications  sodium chloride 0.9 % bolus 1,000 mL (1,000 mLs Intravenous Not Given 03/11/18 1241)  amLODipine (NORVASC) tablet 10 mg (10 mg Oral Given 03/11/18 1209)     Initial Impression / Assessment and Plan / ED Course  I have reviewed the triage vital signs and the nursing notes.  Pertinent labs & imaging results that were available during my care of the patient were reviewed by me and considered in my medical decision making (see chart for details).  Clinical Course as of Mar 11 1300  Tue Mar 11, 2018  1240 Discussed patient's case with Dr. Clayborne DanaMesner.  Patient unwilling to undergo CT scan or lab work today.  Patient will be leaving AMA.  Will treat with clindamycin.   [BM]    Clinical Course User Index [BM] Bill SalinasMorelli, Tarynn Garling A, PA-C   The patient was noted to have elevated BP in ED today. Hx of HTN and has not taken daily medications today. I have spoken with the patient regarding elevated blood pressure readings and the need for improved management.  Patient given single dose of his daily amlodipine 10 mg with improvement of blood pressure.  I instructed the patient to followup with their PCP within 1 week for BP check. I also counseled the patient regarding the signs and symptoms which would require an emergent visit to an emergency department for hypertensive urgency and/or hypertensive emergency.  Patient presented with left upper dental pain present for 2 days.  No drainable abscess visible today.  There is left cheek swelling, maxillary tenderness, at this time there is no eyelid involvement or pain with extraocular movements.  Concern for early preseptal cellulitis at this time.  Labs, fluid and CT of maxillofacial was ordered.  Patient  refusing lab work, fluids and CT scan today.  This was discussed with Dr. Clayborne DanaMesner who advises treatment with clindamycin however patient will be leaving AGAINST MEDICAL ADVICE.  Patient is alert and oriented x3, with full mental capacity to make his own medical decisions.  Risks of leaving AGAINST MEDICAL ADVICE have been discussed with patient including worsening pain, infection, blindness, sepsis, worsening condition, disability and death.  Patient states understanding of risks and still wishes to leave AGAINST MEDICAL ADVICE at this time.  Patient has been provided with clindamycin prescription and encouraged to return to the emergency department at any time for further evaluation and treatment.    Note: Portions of this report may have been transcribed using voice recognition software. Every effort was made to ensure accuracy; however, inadvertent computerized transcription errors may still be present.  Final Clinical Impressions(s) / ED Diagnoses   Final diagnoses:  Dental caries  Elevated blood pressure reading    ED Discharge Orders         Ordered    clindamycin (CLEOCIN) 150 MG capsule  3 times daily     03/11/18 1250    chlorhexidine (PERIDEX) 0.12 % solution  2 times daily     03/11/18 1250           Elizabeth Palau 03/11/18 1303    Mesner, Barbara Cower, MD 03/11/18 [phone]

## 2018-03-11 NOTE — Discharge Instructions (Addendum)
Please return to the Emergency Department for any new or worsening symptoms or if your symptoms do not improve. Please be sure to follow up with your Primary Care Physician as soon as possible regarding your visit today. If you do not have a Primary Doctor please use the resources below to establish one.  Please attempt to follow-up with a dentist as soon as possible, you can find dental resources below. Please take the antibiotic clindamycin as prescribed.  3 pills 3 times a day for 10 days. You may use the Peridex mouthwash as prescribed, please do not swallow this. You have refused your CT scan of the face today to evaluate for worsening infection involving areas around the eye as well as blood work.  You may return to the emergency department at any time for continued evaluation and further care. Your blood pressure was elevated today, please take your blood pressure medication as prescribed by your primary care doctor and follow-up with them as soon as possible for further evaluation and medication management. Because you have refused the CT scan and blood work you are leaving AGAINST MEDICAL ADVICE today.  You may return at any time for further evaluation and treatment.  Contact a doctor if: Your pain is not helped with medicines. Your symptoms are worse. You have new symptoms. Get help right away if: You cannot open your mouth. You are having trouble breathing or swallowing. You have a fever. Your face, neck, or jaw is puffy (swollen). Contact a doctor if: You think you are having a reaction to the medicine you are taking. You have headaches that keep coming back (recurring). You feel dizzy. You have swelling in your ankles. You have trouble with your vision. Get help right away if: You get a very bad headache. You start to feel confused. You feel weak or numb. You feel faint. You get very bad pain in your: Chest. Belly (abdomen). You throw up (vomit) more than once. You have  trouble breathing.   Do not take your medicine if  develop an itchy rash, swelling in your mouth or lips, or difficulty breathing.   RESOURCE GUIDE  Chronic Pain Problems: Contact [facility]  [phone] Patients need to be referred by their primary care doctor.  Insufficient Money for Medicine: Contact United Way:  call "211" or [facility] 212-534-5605.  No Primary Care Doctor: Call Health Connect  [phone] - can help you locate a primary care doctor that  accepts your insurance, provides certain services, etc. Physician Referral [phone]  Agencies that provide inexpensive medical care: [facility]  [phone] [facility]  [phone] [facility]  [phone] [facility]  [phone] Planned [facility]  [phone] [facility]  [phone]  Medicaid-accepting [employer] Providers: [facility]- [address], [address]  [phone], Mon-Fri 9am-7pm, Sat 9am-1pm [facility]- [address] [place], [address] [place]  [phone] [facility]- [address] [place]  [phone] [facility]- [address]  [phone] Renaye Rakers- [address] [place], [address], [phone]  Only accepts [place] patients after they have their name  applied to their card  Self Pay (no insurance) in [facility]: Sickle Cell Patients: Dr Willey Blade, [facility]  [address] [place], [phone] [facility]- [address] [place]  [phone]       -  [facility] [place]- [address], [address]       -     [facility]- see information above (Speak to Cit[employer]Pam H if you do not have insurance)       -  [facility]- [address], [phone]       -  [facility]- [address],  [phone]       -  [facility]- [address] [place] [address], [phone]        -  Dr Julio Sickssei-Bonsu-  [address], [address], [place], [phone]       -  [facility]- [address], [phone]       -  [facility] [place]- [address] [place] [address], [phone], also [address], [phone]       -    [facility]- [address] [place], [phone], 1st & 3rd Saturday   every month, 10am-1pm  1) Find a Doctor and Pay Out of Pocket Although you won't have to find out who is covered by your insurance plan, it is a good idea to ask around and get recommendations. You will then need to call the office and see if the doctor you have chosen will accept you as a new patient and what types of options they offer for patients who are self-pay. Some doctors offer discounts or will set up payment plans for their patients who do not have insurance, but you will need to ask so you aren't surprised when you get to your appointment.  2) Contact Your Local Health Department Not all health departments have doctors that can see patients for sick visits, but many do, so it is worth a call to see if yours does. If you don't know where your local health department is, you can check in your phone book. The CDC also has a tool to help you locate your state's health department, and many state websites also have listings of all of their local health departments.  3) Find a Walk-in Clinic If your illness is not likely to be very severe or complicated, you may want to try a walk in clinic. These are popping up all over the country in pharmacies, drugstores, and shopping centers. They're usually staffed by nurse practitioners or physician assistants that have been trained to treat common illnesses and complaints. They're usually fairly quick and inexpensive. However, if you have serious medical issues or chronic medical problems, these are probably not your best option  STD Testing [facility] of [facility] [place], STD Clinic, [address], [place],  phone [phone] or [phone].  Monday - Friday, call for an appointment. [facility] of [facility] [place], STD Clinic, [place], [place], phone [phone] or [phone].  Monday - Friday, call for an appointment.  Abuse/Neglect: [facility] Child Abuse Hotline [phone] [phone]) 731-060-3724 [facility] [place] Child Abuse Hotline [phone] (After Hours)  Emergency Shelter:  [facility] [phone]) [phone]  Maternity Homes: Room at the [place] [facility] [phone] [facility] [phone]) [phone]  MRSA Hotline #:   873-567-3568303-448-3983  [facility] Resources  [facility] [place]  United Way [facility]. [address]                 [address]         [address] [place]  Blondell Reveal Phone:  [phone]                                  Phone:  [phone]                   Phone:  [phone]  [facility], [phone] [facility] [place]- [phone]       -     [facility] in [place], [address],                                  [phone], [facility] Child Abuse Hotline (814)370-8666 or 484-863-3127 (After Hours)   Behavioral Health Services  Substance Abuse Resources: Alcohol and Drug Services  [phone])[phone] [facility] [phone] [facility] [place] [phone] Floydene Flock [phone] Residential & Outpatient Substance Abuse Program  (516)[phone]  Psychological Services: [facility]  [phone] [facility] Services  [phone] [facility], [phone] [place]. [address], [place], ACCESS LINE: [phone] or [phone], EntrepreneurLoan.[email or web address]  Dental Assistance  If unable to pay or uninsured, contact:  [facility] or [facility]. to become qualified for the adult dental clinic.  Patients with Medicaid: [facility] [phone] [address], [phone] [address], [phone]  If unable to pay, or uninsured, contact [facility] [phone]) or [facility] Department [phone] in [place], [phone] in [facility]) to become qualified for the adult dental clinic   Other Low-Cost Community Dental Services: [facility]- [address] [place], [place], [place], [address], [phone], Ext. [phone], 2nd and 4th Thursday of the month at 6:30am.  10 clients each day by appointment, can sometimes see walk-in patients if someone does not show for an appointment. [facility]- [address] [place], [place], [address], [phone] [facility] [address] [place], [place], [address], [phone] [facility] Health Department- [phone] [facility] Health Department- [phone] [facility] [phone]

## 2018-03-11 NOTE — Congregational Nurse Program (Signed)
Client complained of dental pain and swelling which occurred overnight. Client has left sided facial edema. CN discussed need for evaluation ASAP as well as evaluation of blood pressure. Client states that he doesn't want to go to ED due to wait time. CN states that client needs to have the dental issue evaluated. Client states that he will go to ED for evaluation. Bus passes given.

## 2018-03-11 NOTE — ED Notes (Signed)
Pt declines having IV, this RN attempted x 1, pt states, "I don't want to be stuck again. I just want some antibiotics." Olevia BowensMorelli, [place] informed

## 2018-03-11 NOTE — ED Notes (Signed)
This RN attempted IV stick x1. Unsuccessful. Another RN will come to room to try for line.

## 2018-03-11 NOTE — ED Notes (Signed)
Patient verbalizes understanding of discharge instructions. Opportunity for questioning and answers were provided. Pt signed out AMA. pt discharged from ED ambulatory.

## 2018-03-11 NOTE — ED Notes (Signed)
ED Provider at bedside explaining to pt about signing out AMA risk.

## 2018-03-13 ENCOUNTER — Encounter: Payer: Self-pay | Admitting: Internal Medicine

## 2018-03-13 ENCOUNTER — Ambulatory Visit (INDEPENDENT_AMBULATORY_CARE_PROVIDER_SITE_OTHER): Payer: Self-pay | Admitting: Internal Medicine

## 2018-03-13 VITALS — BP 147/97 | HR 75 | Ht 77.5 in | Wt 313.0 lb

## 2018-03-13 DIAGNOSIS — F1910 Other psychoactive substance abuse, uncomplicated: Secondary | ICD-10-CM

## 2018-03-13 DIAGNOSIS — F3163 Bipolar disorder, current episode mixed, severe, without psychotic features: Secondary | ICD-10-CM

## 2018-03-13 DIAGNOSIS — I480 Paroxysmal atrial fibrillation: Secondary | ICD-10-CM

## 2018-03-13 DIAGNOSIS — I10 Essential (primary) hypertension: Secondary | ICD-10-CM

## 2018-03-13 NOTE — Patient Instructions (Addendum)
[facility] been [facility]   Your physician wants you to follow-up in: ONE YEAR with Dr. Rennis Golden. You will receive a reminder letter in the mail two months in advance. If you don't receive a letter, please call our office to schedule the follow-up appointment.

## 2018-03-13 NOTE — Progress Notes (Signed)
OFFICE NOTE  Chief Complaint:  Toothache  Primary Care Physician: Patient, No Pcp Per  HPI:  Gary Buck is a 52 y.o. male with a past medial history significant for hypertension, dyslipidemia, recurrent paroxysmal atrial fibrillation, remote stroke in 2004, polysubstance abuse including a remote history of cocaine use and ongoing alcohol use and coronary artery disease with a 30% proximal LAD stenosis in 2012 by left heart catheterization.  In 2016 he was seen in went into atrial fibrillation with rapid ventricular response in the hospital.  He subsequently converted spontaneously and was discharged.  He is been noncompliant with some of his medications.  He did not follow-up with me in the office.  He recently was readmitted for A. fib with RVR and just prior to undergoing TEE cardioversion and he again spontaneously converted.  He reports no recent heavy illicit substance use but does use alcohol and marijuana.  He denies any chest pain or worsening shortness of breath.  He notes his blood pressure has been somewhat elevated.  He was started on the hospital on Eliquis and is tolerating that well without any bleeding difficulty.  His CHADSVASC score is 4 or 5, despite his young age, but this owes to elevated blood pressure, diabetes and a prior stroke.  03/13/2018  [name] returns today for follow-up.  Recently has been having issues with tooth pain/abscess.  He was in the ER for that and unfortunately stopped taking his blood pressure medicine so that he could take ibuprofen.  He was told not to take both together but was in significant pain.  Blood pressure was elevated well over 200 and he had to take nitroglycerin several times to improve his chest pain.  He has had no symptoms since then.  He denies any recurrent A. fib.  He denies bleeding problems on Eliquis.  He is in the process of trying to get a dentist to pull his tooth.   PMHx:  Past Medical History:  Diagnosis Date  .  Anginal pain (HCC)   . Bipolar disorder (HCC)   . Depression   . ETOH abuse   . H/O cocaine abuse   . Hypercholesterolemia   . Hypertension   . PAF (paroxysmal atrial fibrillation) (HCC)    Gary Buck 12/21/2014  . Stroke [name]) 2004   pt sts about 12 years ago.   . Tobacco abuse   . Type II diabetes mellitus (HCC) dx'd 12/21/2014    Past Surgical History:  Procedure Laterality Date  . CYSTECTOMY  ~ 2004   "on the top of my forehead"  . INGUINAL HERNIA REPAIR Left ~ 2012    FAMHx:  Family History  Problem Relation Age of Onset  . Diabetes Mellitus II Mother     SOCHx:   reports that he has been smoking cigarettes. He has a 16.00 pack-year smoking history. He has never used smokeless tobacco. He reports that he drinks about 40.0 standard drinks of alcohol per week. He reports that he has current or past drug history. Drug: Marijuana.  ALLERGIES:  No Known Allergies  ROS: Pertinent items noted in HPI and remainder of comprehensive ROS otherwise negative.  HOME MEDS: Current Outpatient Medications on File Prior to Visit  Medication Sig Dispense Refill  . amLODipine (NORVASC) 10 MG tablet Take 1 tablet (10 mg total) by mouth daily. 30 tablet 11  . apixaban (ELIQUIS) 5 MG TABS tablet Take 1 tablet (5 mg total) by mouth 2 (two) times daily. 60 tablet 11  .  carvedilol (COREG) 6.25 MG tablet Take 1 tablet (6.25 mg total) by mouth 2 (two) times daily. Dose change 60 tablet 11  . chlorhexidine (PERIDEX) 0.12 % solution Use as directed 15 mLs in the mouth or throat 2 (two) times daily. 120 mL 0  . clindamycin (CLEOCIN) 150 MG capsule Take 3 capsules (450 mg total) by mouth 3 (three) times daily for 10 days. 90 capsule 0  . hydrochlorothiazide (HYDRODIURIL) 25 MG tablet Take 1 tablet (25 mg total) by mouth daily. Take on tablet in the morning. 30 tablet 11  . lisinopril (PRINIVIL,ZESTRIL) 40 MG tablet Take 1 tablet (40 mg total) by mouth daily. 30 tablet 11  . metFORMIN (GLUCOPHAGE) 500 MG  tablet Take 1 tablet (500 mg total) by mouth 2 (two) times daily with a meal. 60 tablet 6  . nicotine (NICODERM CQ - DOSED IN MG/24 HOURS) 14 mg/24hr patch Place 1 patch (14 mg total) onto the skin daily. 28 patch 0  . nitroGLYCERIN (NITROSTAT) 0.4 MG SL tablet Place 1 tablet (0.4 mg total) under the tongue every 5 (five) minutes x 3 doses as needed for chest pain. 25 tablet 3  . pseudoephedrine-acetaminophen (TYLENOL SINUS) 30-500 MG TABS tablet Take 1 tablet by mouth every 4 (four) hours as needed (cold symptoms).    . risperiDONE (RISPERDAL) 1 MG tablet Take 1 tablet (1 mg total) by mouth 2 (two) times daily. 60 tablet 3  . TRILEPTAL 300 MG tablet Take 1 tablet (300 mg total) by mouth 2 (two) times daily. 60 tablet 3   No current facility-administered medications on file prior to visit.     LABS/IMAGING: No results found for this or any previous visit (from the past 48 hour(s)). No results found.  LIPID PANEL:    Component Value Date/Time   CHOL 158 02/12/2017 1539   TRIG 125 02/12/2017 1539   HDL 51 02/12/2017 1539   CHOLHDL 3.1 02/12/2017 1539   CHOLHDL 2.7 06/30/2016 0622   VLDL 17 06/30/2016 0622   LDLCALC 82 02/12/2017 1539     WEIGHTS: Wt Readings from Last 3 Encounters:  03/13/18 (!) 313 lb (142 kg)  03/11/18 (!) 310 lb (140.6 kg)  06/28/17 (!) 309 lb 6.4 oz (140.3 kg)    VITALS: BP (!) 147/97   Pulse 75   Ht 6' 5.5" (1.969 m)   Wt (!) 313 lb (142 kg)   BMI 36.64 kg/m   EXAM: General appearance: alert, no distress and moderately obese Neck: no carotid bruit, no JVD and thyroid not enlarged, symmetric, no tenderness/mass/nodules Lungs: clear to auscultation bilaterally Heart: regular rate and rhythm Abdomen: soft, non-tender; bowel sounds normal; no masses,  no organomegaly Extremities: extremities normal, atraumatic, no cyanosis or edema Pulses: 2+ and symmetric Skin: Skin color, texture, turgor normal. No rashes or lesions Neurologic: Grossly  normal Psych: Pleasant  EKG: Normal sinus rhythm at 75, voltage criteria for LVH-personally reviewed  ASSESSMENT: 1. Recurrent paroxysmal atrial fibrillation-CHADSVASC score of 4 on Eliquis 2. Hypertension-uncontrolled 3. Dyslipidemia 4. History of mild nonobstructive coronary disease with 30% proximal LAD stenosis in 2012 5. Polysubstance abuse 6. History of medication noncompliance in the past  PLAN: 1.   Mr. Hineman denies any recurrent A. fib recently.  He said no bleeding problems on Eliquis.  Blood pressure is uncontrolled at times however medication noncompliance is an issue.  Recently he has a tooth abscess and will probably need to have it pulled.  He will likely need to stop Eliquis 2 days  prior to that.  He did have some chest comfort recently for which he took nitroglycerin however this is when he stopped his blood pressure medicines and the blood pressure was over 200 systolic.  Since then he has had no further symptoms.  No changes made to his medicines today.  He did want some paperwork regarding behavioral health in order to apply for housing.  I will refer him back to behavioral health for this.  Follow-up annually or sooner as necessary.  Chrystie Nose, MD, [facility], FACP  [place]  [facility]  Medical Director of the Advanced Lipid Disorders &  Cardiovascular Risk Reduction Clinic Diplomate of the American Board of Clinical Lipidology  Attending Cardiologist  Direct Dial: [phone]  Fax: [phone]  Website:  [email or web address] Nicely Raequan Vanschaick 03/13/2018, 11:30 AM

## 2018-03-17 ENCOUNTER — Encounter: Payer: Self-pay | Admitting: Pediatric Intensive Care

## 2018-03-18 ENCOUNTER — Encounter: Payer: Self-pay | Admitting: Pediatric Intensive Care

## 2018-03-18 MED FILL — risperiDONE 1 MG TABS: 1 | 30 days supply | Qty: 60 | Fill #1

## 2018-03-18 MED FILL — LISINOPRIL 40 MG TABLET: 40 | 30 days supply | Qty: 30 | Fill #1

## 2018-03-18 MED FILL — ELIQUIS 5 MG TABLET: 5 | 30 days supply | Qty: 60 | Fill #1

## 2018-03-18 MED FILL — AMLODIPINE BESYLATE 10 MG T: 10 | 30 days supply | Qty: 30 | Fill #1

## 2018-03-18 NOTE — Congregational Nurse Program (Signed)
Follow up for ED visit. Facial swelling is gone. Client states no dental pain at present. Blood pressure remains elevated and client is nearly out of medication. CN will obtain medication from [facility]. CN gave client orange Card application and discussed dental referral process. Client will take application to process at DSS. Client will also follow up with PCP to continue to have consistent source of medication for more adequate blood pressure control.

## 2018-04-08 ENCOUNTER — Telehealth: Payer: Self-pay

## 2018-04-08 ENCOUNTER — Encounter: Payer: Self-pay | Admitting: Pediatric Intensive Care

## 2018-04-08 NOTE — Telephone Encounter (Signed)
Request received from [facility] for an appointment for patient to establish care at [facility].  Notified her that an appointment has been scheduled for 04/28/18 @ 1530.

## 2018-04-08 NOTE — Congregational Nurse Program (Signed)
BP check. Client states that he has secured housing. CN will check on status of medication refills before client leaves shelter. Discussed establishing PCP at [facility]- client will have access to pharmacy and SW services. Client will need dental evaluation after he applies for his [employer]. CN will call to make appointment. Client states that his housing agreement is contingent upon continuing BH services at [employer]. Client has been taking Vraylar with good results but needs to submit tax information to pharmacy to continue medication via manufacturer program. CN will call [facility] to verify tax information for client. Cleint agrees to plan.

## 2018-04-28 ENCOUNTER — Ambulatory Visit: Payer: Self-pay | Admitting: Family Medicine

## 2018-04-29 MED FILL — CARVEDILOL 6.25 MG TABLET: 6.25 | 30 days supply | Qty: 60 | Fill #1

## 2018-04-29 MED FILL — HYDROCHLOROTHIAZIDE 25 MG T: 25 | 30 days supply | Qty: 30 | Fill #1

## 2018-04-29 MED FILL — AMLODIPINE BESYLATE 10 MG T: 10 | 30 days supply | Qty: 30 | Fill #2

## 2018-04-29 MED FILL — LISINOPRIL 40 MG TABLET: 40 | 30 days supply | Qty: 30 | Fill #2

## 2018-04-29 MED FILL — metFORMIN HCL 500 MG TABS: 500 | 30 days supply | Qty: 60 | Fill #1

## 2018-04-29 MED FILL — ELIQUIS 5 MG TABLET: 5 | 30 days supply | Qty: 60 | Fill #2

## 2018-04-29 MED FILL — OXcarbazepine 300 MG TABS: 300 | 30 days supply | Qty: 60 | Fill #1

## 2018-09-07 ENCOUNTER — Emergency Department (HOSPITAL_COMMUNITY)
Admission: EM | Admit: 2018-09-07 | Discharge: 2018-09-07 | Disposition: A | Discharge status: Home / Self Care | Payer: Self-pay | Attending: Emergency Medicine | Admitting: Emergency Medicine

## 2018-09-07 ENCOUNTER — Encounter (HOSPITAL_COMMUNITY): Payer: Self-pay | Admitting: Emergency Medicine

## 2018-09-07 ENCOUNTER — Other Ambulatory Visit: Payer: Self-pay

## 2018-09-07 DIAGNOSIS — R040 Epistaxis: Secondary | ICD-10-CM | POA: Insufficient documentation

## 2018-09-07 DIAGNOSIS — E119 Type 2 diabetes mellitus without complications: Secondary | ICD-10-CM | POA: Insufficient documentation

## 2018-09-07 DIAGNOSIS — Z7984 Long term (current) use of oral hypoglycemic drugs: Secondary | ICD-10-CM | POA: Insufficient documentation

## 2018-09-07 DIAGNOSIS — F17210 Nicotine dependence, cigarettes, uncomplicated: Secondary | ICD-10-CM | POA: Insufficient documentation

## 2018-09-07 DIAGNOSIS — I10 Essential (primary) hypertension: Secondary | ICD-10-CM | POA: Insufficient documentation

## 2018-09-07 DIAGNOSIS — R112 Nausea with vomiting, unspecified: Secondary | ICD-10-CM | POA: Insufficient documentation

## 2018-09-07 DIAGNOSIS — I4891 Unspecified atrial fibrillation: Secondary | ICD-10-CM | POA: Insufficient documentation

## 2018-09-07 DIAGNOSIS — F319 Bipolar disorder, unspecified: Secondary | ICD-10-CM | POA: Insufficient documentation

## 2018-09-07 DIAGNOSIS — Z7901 Long term (current) use of anticoagulants: Secondary | ICD-10-CM | POA: Insufficient documentation

## 2018-09-07 DIAGNOSIS — R0789 Other chest pain: Secondary | ICD-10-CM | POA: Insufficient documentation

## 2018-09-07 HISTORY — DX: Unspecified atrial fibrillation: I48.91

## 2018-09-07 LAB — CBC WITH DIFFERENTIAL/PLATELET
Abs Immature Granulocytes: 0.05 10*3/uL (ref 0.00–0.07)
Basophils Absolute: 0.1 10*3/uL (ref 0.0–0.1)
Basophils Relative: 0 %
EOS ABS: 0.1 10*3/uL (ref 0.0–0.5)
EOS PCT: 0 %
HCT: 51.3 % (ref 39.0–52.0)
Hemoglobin: 17.1 g/dL — ABNORMAL HIGH (ref 13.0–17.0)
IMMATURE GRANULOCYTES: 0 %
LYMPHS ABS: 2.6 10*3/uL (ref 0.7–4.0)
LYMPHS PCT: 16 %
MCH: 30.6 pg (ref 26.0–34.0)
MCHC: 33.3 g/dL (ref 30.0–36.0)
MCV: 91.9 fL (ref 80.0–100.0)
Monocytes Absolute: 1.2 10*3/uL — ABNORMAL HIGH (ref 0.1–1.0)
Monocytes Relative: 7 %
NEUTROS PCT: 77 %
NRBC: 0 % (ref 0.0–0.2)
Neutro Abs: 11.9 10*3/uL — ABNORMAL HIGH (ref 1.7–7.7)
Platelets: 266 10*3/uL (ref 150–400)
RBC: 5.58 MIL/uL (ref 4.22–5.81)
RDW: 13.9 % (ref 11.5–15.5)
WBC: 15.8 10*3/uL — AB (ref 4.0–10.5)

## 2018-09-07 LAB — COMPREHENSIVE METABOLIC PANEL
ALT: 15 U/L (ref 0–44)
ANION GAP: 15 (ref 5–15)
AST: 18 U/L (ref 15–41)
Albumin: 4.4 g/dL (ref 3.5–5.0)
Alkaline Phosphatase: 77 U/L (ref 38–126)
BUN: 10 mg/dL (ref 6–20)
CHLORIDE: 94 mmol/L — AB (ref 98–111)
CO2: 26 mmol/L (ref 22–32)
CREATININE: 0.82 mg/dL (ref 0.61–1.24)
Calcium: 9.2 mg/dL (ref 8.9–10.3)
GFR calc Af Amer: >60 mL/min (ref >60)
Glucose, Bld: 87 mg/dL (ref 70–99)
Potassium: 3.3 mmol/L — ABNORMAL LOW (ref 3.5–5.1)
SODIUM: 135 mmol/L (ref 135–145)
Total Bilirubin: 0.9 mg/dL (ref 0.3–1.2)
Total Protein: 7.5 g/dL (ref 6.5–8.1)

## 2018-09-07 LAB — TROPONIN I

## 2018-09-07 LAB — LIPASE, BLOOD: Lipase: 25 U/L (ref 11–51)

## 2018-09-07 MED ORDER — ONDANSETRON HCL 4 MG PO TABS: 4.0000 mg | ORAL_TABLET | Freq: Four times a day (QID) | ORAL | 0 refills | Status: AC

## 2018-09-07 MED ORDER — CLONIDINE HCL 0.1 MG PO TABS
0.1000 mg | ORAL_TABLET | Freq: Once | ORAL | Status: AC
  Administered 2018-09-07: 0.1 mg via ORAL
  Filled 2018-09-07: qty 1

## 2018-09-07 MED ORDER — ONDANSETRON HCL 4 MG/2ML IJ SOLN
4.0000 mg | Freq: Once | INTRAMUSCULAR | Status: AC
  Administered 2018-09-07: 4 mg via INTRAVENOUS
  Filled 2018-09-07: qty 2

## 2018-09-07 MED ORDER — SODIUM CHLORIDE 0.9 % IV BOLUS
1000.0000 mL | Freq: Once | INTRAVENOUS | Status: AC
  Administered 2018-09-07: 1000 mL via INTRAVENOUS

## 2018-09-07 NOTE — Discharge Instructions (Signed)
You were seen in the emergency department for nausea and vomiting and were noted to have a very elevated blood pressure.  It will be important for you to take your blood pressure medicines on a regular basis.  We are sending you home some nausea medicine if needed.  Please follow-up with your doctor and return if any worsening symptoms

## 2018-09-07 NOTE — ED Triage Notes (Signed)
Pt transported via EMS from home with c/o HTN n/v. +n/v today, nosebleed earlier today. Cocaine, etoh last night, no fever, +chills, +sweating.  Noncompliant with meds. A & O.

## 2018-09-07 NOTE — ED Provider Notes (Signed)
[facility] EMERGENCY DEPARTMENT Provider Note   CSN: [id number] Arrival date & time: 09/07/18  2115    History   Chief Complaint Chief Complaint  Patient presents with  . Hypertension    HPI Trayvon Wierman is a 53 y.o. male.  He has a history of hypertension who sounds like he inconsistently takes his medicine.  He is complaining of 5 episodes of vomiting today associated with a nosebleed.  No abdominal pain.  No diarrhea.  He feels a little bit of chest heaviness that is on and off that he has had before.  He is felt sweaty and chilled but has not had a fever.  He said he used alcohol and some cocaine last night but not as much as he usually uses.  No recent travel no sick contacts.     The history is provided by the patient and the EMS personnel.  Emesis  Severity:  Moderate Duration:  12 hours Timing:  Intermittent Number of daily episodes:  5 Quality:  Bilious material Progression:  Unchanged Chronicity:  New Recent urination:  Normal Context: not post-tussive   Relieved by:  Nothing Worsened by:  Liquids Ineffective treatments:  Liquids Associated symptoms: chills   Associated symptoms: no abdominal pain, no cough, no diarrhea, no fever, no headaches, no myalgias, no sore throat and no URI   Risk factors: alcohol use   Risk factors: no sick contacts, no suspect food intake and no travel to endemic areas     Past Medical History:  Diagnosis Date  . Anginal pain (HCC)   . Bipolar disorder (HCC)   . Depression   . ETOH abuse   . H/O cocaine abuse   . Hypercholesterolemia   . Hypertension   . PAF (paroxysmal atrial fibrillation) (HCC)    [name] 12/21/2014  . Stroke [facility]) 2004   pt sts about 12 years ago.   . Tobacco abuse   . Type II diabetes mellitus (HCC) dx'd 12/21/2014    Patient Active Problem List   Diagnosis Date Noted  . Mixed hyperlipidemia 06/28/2017  . Hypertension 11/18/2016  . Obesity (BMI 30-39.9) 11/18/2016  . PAF  (paroxysmal atrial fibrillation) (HCC) 11/17/2016  . Hypercholesterolemia 11/17/2016  . Type II diabetes mellitus (HCC) 11/17/2016  . Bipolar disorder (HCC) 11/17/2016  . ETOH abuse 11/17/2016  . Tobacco abuse 11/17/2016  . Bipolar affective disorder, mixed, severe (HCC) 06/28/2016  . Bipolar affective disorder, current episode mixed, without psychotic features (HCC) 06/28/2016  . Atrial fibrillation, unspecified   . Elevated troponin   . Atrial fibrillation with RVR (HCC) 12/21/2014  . Coronary artery disease involving native coronary artery of native heart without angina pectoris 12/21/2014  . Chest pain   . Essential hypertension   . Remote history of stroke   . Substance abuse [facility])     Past Surgical History:  Procedure Laterality Date  . CYSTECTOMY  ~ 2004   "on the top of my forehead"  . INGUINAL HERNIA REPAIR Left ~ 2012        Home Medications    Prior to Admission medications   Medication Sig Start Date End Date Taking? Authorizing Provider  amLODipine (NORVASC) 10 MG tablet Take 1 tablet (10 mg total) by mouth daily. 01/21/18   Storm Frisk, MD  apixaban (ELIQUIS) 5 MG TABS tablet Take 1 tablet (5 mg total) by mouth 2 (two) times daily. 01/21/18   Storm Frisk, MD  carvedilol (COREG) 6.25 MG tablet Take 1 tablet (  6.25 mg total) by mouth 2 (two) times daily. Dose change 01/21/18 04/21/18  Storm Frisk, MD  chlorhexidine (PERIDEX) 0.12 % solution Use as directed 15 mLs in the mouth or throat 2 (two) times daily. 03/11/18   Harlene Salts A, PA-C  hydrochlorothiazide (HYDRODIURIL) 25 MG tablet Take 1 tablet (25 mg total) by mouth daily. Take on tablet in the morning. 01/21/18   Storm Frisk, MD  lisinopril (PRINIVIL,ZESTRIL) 40 MG tablet Take 1 tablet (40 mg total) by mouth daily. 01/21/18   Storm Frisk, MD  metFORMIN (GLUCOPHAGE) 500 MG tablet Take 1 tablet (500 mg total) by mouth 2 (two) times daily with a meal. 01/21/18   Storm Frisk, MD  nicotine  (NICODERM CQ - DOSED IN MG/24 HOURS) 14 mg/24hr patch Place 1 patch (14 mg total) onto the skin daily. 06/28/17   Hilty, Lisette Abu, MD  nitroGLYCERIN (NITROSTAT) 0.4 MG SL tablet Place 1 tablet (0.4 mg total) under the tongue every 5 (five) minutes x 3 doses as needed for chest pain. 07/30/17   Hilty, Lisette Abu, MD  pseudoephedrine-acetaminophen (TYLENOL SINUS) 30-500 MG TABS tablet Take 1 tablet by mouth every 4 (four) hours as needed (cold symptoms).    [provider]  risperiDONE (RISPERDAL) 1 MG tablet Take 1 tablet (1 mg total) by mouth 2 (two) times daily. 01/21/18   Storm Frisk, MD  TRILEPTAL 300 MG tablet Take 1 tablet (300 mg total) by mouth 2 (two) times daily. 01/21/18   Storm Frisk, MD    Family History Family History  Problem Relation Age of Onset  . Diabetes Mellitus II Mother     Social History Social History   Tobacco Use  . Smoking status: Current Every Day Smoker    Packs/day: 0.50    Years: 32.00    Pack years: 16.00    Types: Cigarettes  . Smokeless tobacco: Never Used  Substance Use Topics  . Alcohol use: Yes    Alcohol/week: 40.0 standard drinks    Types: 40 Cans of beer per week  . Drug use: Yes    Types: Marijuana    Comment: last used crack 03/08/18, last marijuana 03/10/18     Allergies   Patient has no known allergies.   Review of Systems Review of Systems  Constitutional: Positive for chills. Negative for fever.  HENT: Positive for nosebleeds. Negative for sore throat.   Eyes: Negative for visual disturbance.  Respiratory: Negative for cough and shortness of breath.   Cardiovascular: Positive for chest pain.  Gastrointestinal: Positive for vomiting. Negative for abdominal pain and diarrhea.  Genitourinary: Negative for dysuria.  Musculoskeletal: Negative for myalgias.  Skin: Negative for rash.  Neurological: Negative for headaches.     Physical Exam Updated Vital Signs BP (!) 180/100 (BP Location: Left Arm)   Pulse 87    Temp 98.2 F (36.8 C) (Oral)   Resp 15   Ht  (1.956 m)   Wt (!) 140.6 kg   SpO2 99%   BMI 36.76 kg/m   Physical Exam Vitals signs and nursing note reviewed.  Constitutional:      Appearance: He is well-developed.  HENT:     Head: Normocephalic and atraumatic.     Nose: Nose normal.  Eyes:     Conjunctiva/sclera: Conjunctivae normal.  Neck:     Musculoskeletal: Neck supple.  Cardiovascular:     Rate and Rhythm: Normal rate and regular rhythm.     Heart sounds: No  murmur.  Pulmonary:     Effort: Pulmonary effort is normal. No respiratory distress.     Breath sounds: Normal breath sounds.  Abdominal:     Palpations: Abdomen is soft.     Tenderness: There is no abdominal tenderness.  Musculoskeletal: Normal range of motion.     Right lower leg: No edema.     Left lower leg: No edema.  Skin:    General: Skin is warm and dry.     Capillary Refill: Capillary refill takes less than 2 seconds.  Neurological:     General: No focal deficit present.     Mental Status: He is alert and oriented to person, place, and time.     Sensory: No sensory deficit.     Motor: No weakness.      ED Treatments / Results  Labs (all labs ordered are listed, but only abnormal results are displayed) Labs Reviewed  COMPREHENSIVE METABOLIC PANEL - Abnormal; Notable for the following components:      Result Value   Potassium 3.3 (*)    Chloride 94 (*)    All other components within normal limits  CBC WITH DIFFERENTIAL/PLATELET - Abnormal; Notable for the following components:   WBC 15.8 (*)    Hemoglobin 17.1 (*)    Neutro Abs 11.9 (*)    Monocytes Absolute 1.2 (*)    All other components within normal limits  TROPONIN I  LIPASE, BLOOD    EKG EKG Interpretation  Date/Time:  Sunday September 07 2018 21:25:51 EDT Ventricular Rate:  83 PR Interval:    QRS Duration: 109 QT Interval:  408 QTC Calculation: 480 R Axis:   -30 Text Interpretation:  Sinus rhythm Left atrial enlargement  Left ventricular hypertrophy Anterior Q waves, possibly due to LVH Confirmed by Meridee Score [phone]) on 09/07/2018 10:49:05 PM   Radiology No results found.  Procedures Procedures (including critical care time)  Medications Ordered in ED Medications  ondansetron (ZOFRAN) injection 4 mg (4 mg Intravenous Given 09/07/18 2225)  sodium chloride 0.9 % bolus 1,000 mL (0 mLs Intravenous Stopped 09/07/18 2338)  cloNIDine (CATAPRES) tablet 0.1 mg (0.1 mg Oral Given 09/07/18 2228)     Initial Impression / Assessment and Plan / ED Course  I have reviewed the triage vital signs and the nursing notes.  Pertinent labs & imaging results that were available during my care of the patient were reviewed by me and considered in my medical decision making (see chart for details).  Clinical Course as of Sep 07 1209  Sun Sep 07, 2018  2263 53 year old male with known history of hypertension here after using alcohol and cocaine last night complaining of multiple episodes of vomiting and a nosebleed that is since stopped.  He is hypertensive here with blood pressures 225/120.  Not tachycardic.  Benign exam awake alert nontoxic-appearing.  He is getting some screening labs IV fluids Zofran.  Will likely need some blood pressure control   [MB]  2128 Differential includes malignant hypertension, gastroenteritis, gastritis, myocardial ischemia, infection.   [MB]  2325 Patient feels better after some IV fluids.  Blood pressure still elevated although this may be his baseline.  He is tolerated some fluids here and he is asking for 2 sandwiches.   [MB]    Clinical Course User Index [MB] Terrilee Files, MD       Final Clinical Impressions(s) / ED Diagnoses   Final diagnoses:  Non-intractable vomiting with nausea, unspecified vomiting type  Essential hypertension  ED Discharge Orders    None       Terrilee Files, MD 09/08/18 1212

## 2018-09-07 NOTE — ED Notes (Signed)
Nurse drawing labs. 

## 2018-09-27 NOTE — Telephone Encounter (Signed)
done

## 2018-11-11 ENCOUNTER — Emergency Department (HOSPITAL_COMMUNITY): Payer: Self-pay

## 2018-11-11 ENCOUNTER — Emergency Department (HOSPITAL_COMMUNITY)
Admission: EM | Admit: 2018-11-11 | Discharge: 2018-11-11 | Disposition: A | Discharge status: Home / Self Care | Payer: Self-pay | Attending: Emergency Medicine | Admitting: Emergency Medicine

## 2018-11-11 ENCOUNTER — Other Ambulatory Visit: Payer: Self-pay

## 2018-11-11 ENCOUNTER — Encounter (HOSPITAL_COMMUNITY): Payer: Self-pay

## 2018-11-11 DIAGNOSIS — E119 Type 2 diabetes mellitus without complications: Secondary | ICD-10-CM | POA: Insufficient documentation

## 2018-11-11 DIAGNOSIS — R51 Headache: Secondary | ICD-10-CM | POA: Insufficient documentation

## 2018-11-11 DIAGNOSIS — G8929 Other chronic pain: Secondary | ICD-10-CM | POA: Insufficient documentation

## 2018-11-11 DIAGNOSIS — F319 Bipolar disorder, unspecified: Secondary | ICD-10-CM | POA: Insufficient documentation

## 2018-11-11 DIAGNOSIS — F17210 Nicotine dependence, cigarettes, uncomplicated: Secondary | ICD-10-CM | POA: Insufficient documentation

## 2018-11-11 DIAGNOSIS — R7303 Prediabetes: Secondary | ICD-10-CM

## 2018-11-11 DIAGNOSIS — F1010 Alcohol abuse, uncomplicated: Secondary | ICD-10-CM | POA: Insufficient documentation

## 2018-11-11 DIAGNOSIS — I10 Essential (primary) hypertension: Secondary | ICD-10-CM | POA: Insufficient documentation

## 2018-11-11 DIAGNOSIS — R079 Chest pain, unspecified: Secondary | ICD-10-CM | POA: Insufficient documentation

## 2018-11-11 DIAGNOSIS — I2510 Atherosclerotic heart disease of native coronary artery without angina pectoris: Secondary | ICD-10-CM | POA: Insufficient documentation

## 2018-11-11 DIAGNOSIS — Z760 Encounter for issue of repeat prescription: Secondary | ICD-10-CM | POA: Insufficient documentation

## 2018-11-11 DIAGNOSIS — Z8673 Personal history of transient ischemic attack (TIA), and cerebral infarction without residual deficits: Secondary | ICD-10-CM | POA: Insufficient documentation

## 2018-11-11 LAB — TROPONIN I: Troponin I: <0.03 ng/mL (ref <0.03)

## 2018-11-11 LAB — COMPREHENSIVE METABOLIC PANEL
ALT: 20 U/L (ref 0–44)
AST: 33 U/L (ref 15–41)
Albumin: 4.7 g/dL (ref 3.5–5.0)
Alkaline Phosphatase: 91 U/L (ref 38–126)
Anion gap: 19 — ABNORMAL HIGH (ref 5–15)
BUN: 7 mg/dL (ref 6–20)
CO2: 20 mmol/L — ABNORMAL LOW (ref 22–32)
Calcium: 10 mg/dL (ref 8.9–10.3)
Chloride: 102 mmol/L (ref 98–111)
Creatinine, Ser: 0.95 mg/dL (ref 0.61–1.24)
GFR calc Af Amer: >60 mL/min (ref >60)
GFR calc non Af Amer: >60 mL/min (ref >60)
Glucose, Bld: 81 mg/dL (ref 70–99)
Potassium: 4 mmol/L (ref 3.5–5.1)
Sodium: 141 mmol/L (ref 135–145)
Total Bilirubin: UNDETERMINED mg/dL (ref 0.3–1.2)
Total Protein: 8.4 g/dL — ABNORMAL HIGH (ref 6.5–8.1)

## 2018-11-11 LAB — CBC WITH DIFFERENTIAL/PLATELET
Abs Immature Granulocytes: 0.05 10*3/uL (ref 0.00–0.07)
Basophils Absolute: 0.1 10*3/uL (ref 0.0–0.1)
Basophils Relative: 0 %
Eosinophils Absolute: 0 10*3/uL (ref 0.0–0.5)
Eosinophils Relative: 0 %
HCT: 60.9 % — ABNORMAL HIGH (ref 39.0–52.0)
Hemoglobin: 19.6 g/dL — ABNORMAL HIGH (ref 13.0–17.0)
Immature Granulocytes: 0 %
Lymphocytes Relative: 19 %
Lymphs Abs: 2.6 10*3/uL (ref 0.7–4.0)
MCH: 31.8 pg (ref 26.0–34.0)
MCHC: 32.2 g/dL (ref 30.0–36.0)
MCV: 98.7 fL (ref 80.0–100.0)
Monocytes Absolute: 1.2 10*3/uL — ABNORMAL HIGH (ref 0.1–1.0)
Monocytes Relative: 9 %
Neutro Abs: 9.5 10*3/uL — ABNORMAL HIGH (ref 1.7–7.7)
Neutrophils Relative %: 72 %
Platelets: 264 10*3/uL (ref 150–400)
RBC: 6.17 MIL/uL — ABNORMAL HIGH (ref 4.22–5.81)
RDW: 13.7 % (ref 11.5–15.5)
WBC: 13.4 10*3/uL — ABNORMAL HIGH (ref 4.0–10.5)
nRBC: 0 % (ref 0.0–0.2)

## 2018-11-11 MED ORDER — APIXABAN 5 MG PO TABS: 5.0000 mg | ORAL_TABLET | Freq: Two times a day (BID) | ORAL | 0 refills | Status: AC

## 2018-11-11 MED ORDER — LISINOPRIL 40 MG PO TABS: 40.0000 mg | ORAL_TABLET | Freq: Every day | ORAL | 0 refills | Status: AC

## 2018-11-11 MED ORDER — HYDROCHLOROTHIAZIDE 25 MG PO TABS: 25.0000 mg | ORAL_TABLET | Freq: Every day | ORAL | 0 refills | Status: DC

## 2018-11-11 MED ORDER — METFORMIN HCL 500 MG PO TABS: 500.0000 mg | ORAL_TABLET | Freq: Two times a day (BID) | ORAL | 0 refills | Status: DC

## 2018-11-11 MED ORDER — AMLODIPINE BESYLATE 10 MG PO TABS: 10.0000 mg | ORAL_TABLET | Freq: Every day | ORAL | 0 refills | Status: DC

## 2018-11-11 MED ORDER — METFORMIN HCL 500 MG PO TABS: 500.0000 mg | ORAL_TABLET | Freq: Two times a day (BID) | ORAL | 0 refills | Status: AC

## 2018-11-11 MED ORDER — CARVEDILOL 6.25 MG PO TABS: 6.2500 mg | ORAL_TABLET | Freq: Two times a day (BID) | ORAL | 0 refills | Status: DC

## 2018-11-11 MED ORDER — HYDRALAZINE HCL 20 MG/ML IJ SOLN
10.0000 mg | Freq: Once | INTRAMUSCULAR | Status: AC
  Administered 2018-11-11: 10 mg via INTRAVENOUS
  Filled 2018-11-11: qty 1

## 2018-11-11 MED ORDER — APIXABAN 5 MG PO TABS: 5.0000 mg | ORAL_TABLET | Freq: Two times a day (BID) | ORAL | 0 refills | Status: DC

## 2018-11-11 MED ORDER — TRILEPTAL 300 MG PO TABS: 300.0000 mg | ORAL_TABLET | Freq: Two times a day (BID) | ORAL | 0 refills | Status: DC

## 2018-11-11 MED ORDER — LISINOPRIL 40 MG PO TABS: 40.0000 mg | ORAL_TABLET | Freq: Every day | ORAL | 0 refills | Status: DC

## 2018-11-11 MED ORDER — SODIUM CHLORIDE 0.9 % IV BOLUS: 1000.0000 mL | Freq: Once | INTRAVENOUS | Status: DC

## 2018-11-11 MED ORDER — CLONIDINE HCL 0.1 MG PO TABS
0.1000 mg | ORAL_TABLET | Freq: Once | ORAL | Status: AC
  Administered 2018-11-11: 0.1 mg via ORAL
  Filled 2018-11-11: qty 1

## 2018-11-11 MED ORDER — AMLODIPINE BESYLATE 10 MG PO TABS: 10.0000 mg | ORAL_TABLET | Freq: Every day | ORAL | 0 refills | Status: AC

## 2018-11-11 MED FILL — metFORMIN HCL 500 MG TABS: 500 | 30 days supply | Qty: 60 | Fill #0

## 2018-11-11 MED FILL — AMLODIPINE BESYLATE 10 MG T: 10 | 30 days supply | Qty: 30 | Fill #0

## 2018-11-11 MED FILL — ELIQUIS 5 MG TABLET: 5 | 30 days supply | Qty: 60 | Fill #0

## 2018-11-11 MED FILL — CARVEDILOL 6.25 MG TABLET: 6.25 | 30 days supply | Qty: 60 | Fill #0

## 2018-11-11 MED FILL — HYDROCHLOROTHIAZIDE 25 MG T: 25 | 30 days supply | Qty: 30 | Fill #0

## 2018-11-11 MED FILL — LISINOPRIL 40 MG TABS: 40 | 30 days supply | Qty: 30 | Fill #0

## 2018-11-11 MED FILL — OXcarbazepine 300 MG TABS: 300 | 30 days supply | Qty: 60 | Fill #0

## 2018-11-11 NOTE — ED Notes (Signed)
Patient transported to CT 

## 2018-11-11 NOTE — ED Notes (Signed)
Patient transported to X-ray 

## 2018-11-11 NOTE — ED Notes (Signed)
Pt discharged with all belongings. Discharged instructions reviewed with pt, pt verbalized understanding. Opportunity for questions provided. Pt discharged with medications.

## 2018-11-11 NOTE — ED Notes (Signed)
Pt tolerating fluids.   

## 2018-11-11 NOTE — Discharge Instructions (Signed)
Evaluated today for high blood pressure.  Please take your blood pressure medicines.  You were given them the emergency department.  You will also need to establish care with a primary care provider.  I referred you to 1 of discharge.  Return to the ED for any new or worsening symptoms.

## 2018-11-11 NOTE — ED Notes (Signed)
Pt. Was verbal- He is getting in touch with his family via cell phone.

## 2018-11-11 NOTE — ED Triage Notes (Signed)
Pt reports to ED via EMS. Per EMS pt states he was feeling SOB with exertion. Pt states he has not taken his BP medication, EMS BP 220/130. Pt diaphoretic.

## 2018-11-11 NOTE — ED Provider Notes (Signed)
[facility] EMERGENCY DEPARTMENT Provider Note   CSN: [id number] Arrival date & time: 11/11/18  1105  History   Chief Complaint Chief Complaint  Patient presents with   Hypertension    HPI Gary Buck is a 53 y.o. male with past medical history significant for afib, HTN, cocaine abuse, bipolar disorder, chronic chest pain, CVA, tobacco abuse, DM who presents for evaluation of HTN. Patient non compliant with HTN meds.  Patient states he has not taken his medication since beginning of COVID, 2 months ago, however he does bring these medications and are at bedside.  These medications do not like they have been refilled since November 2019.  Patient is unsure. States he does not have any other additional bottles besides the ones he presents with.  Bottles are approximately half full.  States he does have a pressure across his chest, however states this is a chronic pain.  Nuys any new chest pain.  Pain does not radiate to left arm or into left neck.  He has no associated diaphoresis, lightheadedness, dizziness, nausea or vomiting.  Pain is nonexertional or pleuritic in nature.  He has not any hemoptysis, lower extremity tenderness or swelling, recent mobilization or recent surgery.  States his pain feels like his chronic pain.  Patient states since he has been off of his bipolar medicines and he became anxious today and got into an argument with someone. No dizziness, lightheadedness, N/V, slurred speech, facial asymmetry, unilateral weakness, abd pain, diarrhea, constipation or dysuria, fever, chills, headache, vision changes. Denies additional aggravating or alleviating factors. Admits to cocaine use "1 week ago."  History obtained from patient and past medical records. No interpretor was used.    HPI  Past Medical History:  Diagnosis Date   Anginal pain (HCC)    Atrial fibrillation (HCC) 2017   Bipolar disorder (HCC)    Depression    ETOH abuse    H/O cocaine  abuse (HCC)    Hypercholesterolemia    Hypertension    PAF (paroxysmal atrial fibrillation) (HCC)    [name] 12/21/2014   Stroke (HCC) 2004   pt sts about 12 years ago.    Tobacco abuse    Type II diabetes mellitus (HCC) dx'd 12/21/2014    Patient Active Problem List   Diagnosis Date Noted   Mixed hyperlipidemia 06/28/2017   Hypertension 11/18/2016   Obesity (BMI 30-39.9) 11/18/2016   PAF (paroxysmal atrial fibrillation) (HCC) 11/17/2016   Hypercholesterolemia 11/17/2016   Type II diabetes mellitus (HCC) 11/17/2016   Bipolar disorder (HCC) 11/17/2016   ETOH abuse 11/17/2016   Tobacco abuse 11/17/2016   Bipolar affective disorder, mixed, severe (HCC) 06/28/2016   Bipolar affective disorder, current episode mixed, without psychotic features (HCC) 06/28/2016   Atrial fibrillation, unspecified    Elevated troponin    Atrial fibrillation with RVR (HCC) 12/21/2014   Coronary artery disease involving native coronary artery of native heart without angina pectoris 12/21/2014   Chest pain    Essential hypertension    Remote history of stroke    Substance abuse [facility])     Past Surgical History:  Procedure Laterality Date   CYSTECTOMY  ~ 2004   "on the top of my forehead"   INGUINAL HERNIA REPAIR Left ~ 2012        Home Medications    Prior to Admission medications   Medication Sig Start Date End Date Taking? Authorizing Provider  apixaban (ELIQUIS) 5 MG TABS tablet Take 1 tablet (5 mg total) by  mouth 2 (two) times daily. 11/11/18  Yes Dejean Tribby A, PA-C  nitroGLYCERIN (NITROSTAT) 0.4 MG SL tablet Place 1 tablet (0.4 mg total) under the tongue every 5 (five) minutes x 3 doses as needed for chest pain. 07/30/17  Yes Hilty, Lisette Abu, MD  amLODipine (NORVASC) 10 MG tablet Take 1 tablet (10 mg total) by mouth daily. 11/11/18   Govani Radloff A, PA-C  carvedilol (COREG) 6.25 MG tablet Take 1 tablet (6.25 mg total) by mouth 2 (two) times daily. Dose change  11/11/18 02/09/19  Keyston Ardolino A, PA-C  chlorhexidine (PERIDEX) 0.12 % solution Use as directed 15 mLs in the mouth or throat 2 (two) times daily. Patient not taking: Reported on 09/07/2018 03/11/18   Harlene Salts A, PA-C  hydrochlorothiazide (HYDRODIURIL) 25 MG tablet Take 1 tablet (25 mg total) by mouth daily. Take on tablet in the morning. 11/11/18   Manjit Bufano A, PA-C  lisinopril (ZESTRIL) 40 MG tablet Take 1 tablet (40 mg total) by mouth daily. 11/11/18   Alek Poncedeleon A, PA-C  metFORMIN (GLUCOPHAGE) 500 MG tablet Take 1 tablet (500 mg total) by mouth 2 (two) times daily with a meal. 11/11/18   Anahis Furgeson A, PA-C  nicotine (NICODERM CQ - DOSED IN MG/24 HOURS) 14 mg/24hr patch Place 1 patch (14 mg total) onto the skin daily. Patient not taking: Reported on 09/07/2018 06/28/17   Chrystie Nose, MD  ondansetron (ZOFRAN) 4 MG tablet Take 1 tablet (4 mg total) by mouth every 6 (six) hours. Patient not taking: Reported on 11/11/2018 09/07/18   Terrilee Files, MD  risperiDONE (RISPERDAL) 1 MG tablet Take 1 tablet (1 mg total) by mouth 2 (two) times daily. Patient not taking: Reported on 09/07/2018 01/21/18   Storm Frisk, MD  TRILEPTAL 300 MG tablet Take 1 tablet (300 mg total) by mouth 2 (two) times daily. 11/11/18   Nthony Lefferts A, PA-C    Family History Family History  Problem Relation Age of Onset   Diabetes Mellitus II Mother     Social History Social History   Tobacco Use   Smoking status: Current Every Day Smoker    Packs/day: 1.00    Years: 32.00    Pack years: 32.00    Types: Cigarettes   Smokeless tobacco: Never Used  Substance Use Topics   Alcohol use: Yes    Alcohol/week: 40.0 standard drinks    Types: 40 Cans of beer per week   Drug use: Yes    Types: Marijuana, Cocaine    Comment: last used crack 03/08/18, last marijuana 03/10/18     Allergies   Patient has no known allergies.   Review of Systems Review of Systems  Constitutional:  Negative.   HENT: Negative.   Respiratory: Positive for chest tightness. Negative for apnea, cough, choking, shortness of breath, wheezing and stridor.   Cardiovascular: Negative for palpitations and leg swelling.  Gastrointestinal: Negative.   Endocrine: Negative.   Genitourinary: Negative.   Musculoskeletal: Negative.   Skin: Negative.   Neurological: Negative.   All other systems reviewed and are negative.  Physical Exam Updated Vital Signs BP (!) 162/103    Pulse 89    Temp 98.9 F (37.2 C) (Oral)    Resp (!) 25    Ht  (1.956 m)    Wt (!) 145.2 kg    SpO2 100%    BMI 37.95 kg/m   Physical Exam Vitals signs and nursing note reviewed.  Constitutional:  General: He is not in acute distress.    Appearance: He is well-developed. He is obese. He is not ill-appearing, toxic-appearing or diaphoretic.     Comments: Talking on phone on initial evaluation. No acute distress noted.  HENT:     Head: Normocephalic and atraumatic.     Nose: Nose normal.     Mouth/Throat:     Mouth: Mucous membranes are moist.     Pharynx: Oropharynx is clear.     Comments: Oropharynx clear. Mucous membranes moist. Eyes:     Pupils: Pupils are equal, round, and reactive to light.  Neck:     Musculoskeletal: Normal range of motion and neck supple.     Comments: Full active and passive ROM without pain No midline or paraspinal tenderness No nuchal rigidity or meningeal signs  Cardiovascular:     Rate and Rhythm: Normal rate and regular rhythm.     Pulses: Normal pulses.     Heart sounds: Normal heart sounds. No murmur. No friction rub. No gallop.      Comments: 2+ radial and DP pulses. Pulmonary:     Effort: Pulmonary effort is normal. No respiratory distress.     Breath sounds: Normal breath sounds. No stridor. No wheezing, rhonchi or rales.     Comments: Speaks in full sentences without difficulty. No assessory muscle usage. Chest:     Chest wall: No tenderness.  Abdominal:     General:  Bowel sounds are normal. There is no distension.     Palpations: Abdomen is soft. There is no mass.     Tenderness: There is no abdominal tenderness. There is no right CVA tenderness, left CVA tenderness, guarding or rebound.     Hernia: No hernia is present.     Comments: Soft, non tender without rebound or guarding.  Musculoskeletal: Normal range of motion.     Comments: Moves all 4 extremities without difficulty. No lower extremity edema, erythema, ecchymosis or warmth. Homans sign negative.  Skin:    General: Skin is warm and dry.     Capillary Refill: Capillary refill takes less than 2 seconds.     Comments: No rashes or lesions. Brisk cap refill  Neurological:     Mental Status: He is alert.     Comments: Mental Status:  Alert, oriented, thought content appropriate. Speech fluent without evidence of aphasia. Able to follow 2 step commands without difficulty.  Cranial Nerves:  II:  Peripheral visual fields grossly normal, pupils equal, round, reactive to light III,IV, VI: ptosis not present, extra-ocular motions intact bilaterally  V,VII: smile symmetric, facial light touch sensation equal VIII: hearing grossly normal bilaterally  IX,X: midline uvula rise  XI: bilateral shoulder shrug equal and strong XII: midline tongue extension  Motor:  5/5 in upper and lower extremities bilaterally including strong and equal grip strength and dorsiflexion/plantar flexion Sensory: Pinprick and light touch normal in all extremities.  Deep Tendon Reflexes: 2+ and symmetric  Cerebellar: normal finger-to-nose with bilateral upper extremities Gait: normal gait and balance CV: distal pulses palpable throughout      ED Treatments / Results  Labs (all labs ordered are listed, but only abnormal results are displayed) Labs Reviewed  CBC WITH DIFFERENTIAL/PLATELET - Abnormal; Notable for the following components:      Result Value   WBC 13.4 (*)    RBC 6.17 (*)    Hemoglobin 19.6 (*)    HCT 60.9  (*)    Neutro Abs 9.5 (*)    Monocytes  Absolute 1.2 (*)    All other components within normal limits  COMPREHENSIVE METABOLIC PANEL - Abnormal; Notable for the following components:   CO2 20 (*)    Total Protein 8.4 (*)    Anion gap 19 (*)    All other components within normal limits  TROPONIN I    EKG EKG Interpretation  Date/Time:  Tuesday Nov 11 2018 11:07:35 EDT Ventricular Rate:  80 PR Interval:    QRS Duration: 127 QT Interval:  404 QTC Calculation: 466 R Axis:   -25 Text Interpretation:  Sinus rhythm LAE, consider biatrial enlargement LVH with IVCD and secondary repol abnrm Anterior ST elevation, probably due to LVH Artifact in lead(s) I III aVR aVL aVF Confirmed by Raeford Razor [phone]) on 11/11/2018 11:20:56 AM   Radiology Dg Chest 2 View  Result Date: 11/11/2018 CLINICAL DATA:  Shortness of breath with exertion. Loss of appetite. Extremity pain. EXAM: CHEST - 2 VIEW COMPARISON:  Radiographs 12/13/2017 and 06/08/2017. FINDINGS: The heart size and mediastinal contours are normal. The lungs are clear. There is no pleural effusion or pneumothorax. No acute osseous findings are identified. Telemetry leads overlie the chest. IMPRESSION: Stable chest.  No active cardiopulmonary process. Electronically Signed   By: Carey Bullocks M.D.   On: 11/11/2018 12:01   Ct Head Wo Contrast  Result Date: 11/11/2018 CLINICAL DATA:  Headache. EXAM: CT HEAD WITHOUT CONTRAST TECHNIQUE: Contiguous axial images were obtained from the base of the skull through the vertex without intravenous contrast. COMPARISON:  CT scan of June 26, 2010. FINDINGS: Brain: No evidence of acute infarction, hemorrhage, hydrocephalus, extra-axial collection or mass lesion/mass effect. Vascular: No hyperdense vessel or unexpected calcification. Skull: Normal. Negative for fracture or focal lesion. Sinuses/Orbits: No acute finding. Other: None. IMPRESSION: Normal head CT. Electronically Signed   By: Lupita Raider M.D.    On: 11/11/2018 12:20    Procedures Procedures (including critical care time)  Medications Ordered in ED Medications  hydrALAZINE (APRESOLINE) injection 10 mg (10 mg Intravenous Given 11/11/18 1247)  cloNIDine (CATAPRES) tablet 0.1 mg (0.1 mg Oral Given 11/11/18 1330)     Initial Impression / Assessment and Plan / ED Course  I have reviewed the triage vital signs and the nursing notes.  Pertinent labs & imaging results that were available during my care of the patient were reviewed by me and considered in my medical decision making (see chart for details).  53 year old appears otherwise well presents for evaluation of HTN. Afebrile, non septic, non ill appearing. States he has been out of his medications for 2 months. Patient seems more concerned with medication refills than anything else.  He has had some central chest tightness however states he has this chronically. No SOB, lightheadedness, dizziness, diaphoresis, radiation of pain to arm or neck.  Pain is not pleuritic or exertional nature.  No hemoptysis, lower extremity tenderness, swelling, recent surgeries immobilization.. No tachycardia, tachypnea or hypoxia. Admits to cocaine use "1 week ago." Heart and lungs clear. No lower extremity edema, erythema or warmth. Low suspicion PE, dissection, ACS, boerhaave, pericarditis or myocarditis. Was seen for similar symptoms 2 months ago. Non focal neuro exam without deficits. Hypertensive however question if this is patients baseline? Will order labs and imaging and reevaluate.  Labs and imagine personally reviewed: CBC: Leukocytosis at 13.4, previous 15.8 BMP: anion gap at 19,previous at 15. No hyperglycemia, no DKA. Troponin: Negative, discussed with patient obtaining delta troponin, however states this is "I have this pain all  the time."  And does not want to stay for additional troponin.  His EKG has been stable without any ST/T changes. EKG: No ST/T changes, No STEMI CT head: Negative DG  Chest: No active CP disease.  Discussed with attending physician Dr. Juleen ChinaKohut whether to obtain Tylenol, Salicylate, Alcohol level, give IVF and recheck anion gap. Does not feel this is needed at this time.  On reevlaution patient requesting DC home and food.  He has required clonidine hydralazine for his blood pressure.  Last blood pressure was 160s over 100, however this is likely at patient's baseline.  He has had similar blood pressures over his past ED visits.  Likely related to his noncompliance with his home medications.  I have consulted with social work.  We have refilled patient's home medications and have supplied this to him in the emergency department.  Of also discussed smoking sensation with patient.  He is not interested in quitting at this time.  I discussed follow-up with PCP so he may continue to get refills on his medications. Discussed with patient HTN can cause death, blindness, CVA and heart disease.  Patient has tolerated p.o. intake without difficulty.  He has no episodes of emesis.  He has continued nonfocal neurologic exam.  His abdomen is soft, nontender without rebound or guarding.  Ambulating in ED without difficulty.  Blood pressure still mildly elevated, however likely at patient's baseline.  Patient is to be discharged with recommendation to follow up with PCP in regards to today's hospital visit. Chest pain is not likely of cardiac or pulmonary etiology d/t presentation, PERC negative, VSS, no tracheal deviation, no JVD or new murmur, RRR, breath sounds equal bilaterally, EKG without acute abnormalities, negative troponin, and negative CXR. Pt has been advised to return to the ED if CP becomes exertional, associated with diaphoresis or nausea, radiates to left jaw/arm, worsens or becomes concerning in any way.   Patient is hemodynamically stable and in no acute distress.  Patient able to ambulate in department prior to ED.  Evaluation does not show acute pathology that would  require ongoing or additional emergent interventions while in the emergency department or further inpatient treatment.  I have discussed the diagnosis with the patient and answered all questions.  Patient has no further complaints prior to discharge.  Patient is comfortable with plan discussed in room and is stable for discharge at this time.  I have discussed strict return precautions for returning to the emergency department.  Patient was encouraged to follow-up with PCP/specialist refer to at discharge.   DDX: Malignant HTN, CVA, ACS, infection, PE, CHF, Overdose, hypertensive urgency, hypertensive emergency   Final Clinical Impressions(s) / ED Diagnoses   Final diagnoses:  Hypertension, unspecified type  Medication refill  Chronic chest pain    ED Discharge Orders         Ordered    amLODipine (NORVASC) 10 MG tablet  Daily,   Status:  Discontinued     11/11/18 1142    apixaban (ELIQUIS) 5 MG TABS tablet  2 times daily,   Status:  Discontinued     11/11/18 1142    carvedilol (COREG) 6.25 MG tablet  2 times daily,   Status:  Discontinued     11/11/18 1142    hydrochlorothiazide (HYDRODIURIL) 25 MG tablet  Daily,   Status:  Discontinued     11/11/18 1142    lisinopril (ZESTRIL) 40 MG tablet  Daily,   Status:  Discontinued     11/11/18 1142  metFORMIN (GLUCOPHAGE) 500 MG tablet  2 times daily with meals,   Status:  Discontinued     11/11/18 1142    TRILEPTAL 300 MG tablet  2 times daily,   Status:  Discontinued     11/11/18 1142    amLODipine (NORVASC) 10 MG tablet  Daily     11/11/18 1327    apixaban (ELIQUIS) 5 MG TABS tablet  2 times daily     11/11/18 1327    carvedilol (COREG) 6.25 MG tablet  2 times daily     11/11/18 1327    hydrochlorothiazide (HYDRODIURIL) 25 MG tablet  Daily     11/11/18 1327    lisinopril (ZESTRIL) 40 MG tablet  Daily     11/11/18 1327    metFORMIN (GLUCOPHAGE) 500 MG tablet  2 times daily with meals     11/11/18 1327    TRILEPTAL 300 MG tablet  2  times daily     11/11/18 1327           Ekam Besson A, PA-C 11/11/18 1724    Raeford Razor, MD 11/12/18 1213

## 2018-11-11 NOTE — Progress Notes (Addendum)
CSW obtained patient's medications from [facility] pharmacy and delivered them to patient in ED. Patient thankful for RN CM and CSW assistance with getting his medications.   Edwin Dada, MSW, LCSW-A Clinical Social Worker [facility] [phone]

## 2018-11-11 NOTE — TOC Transition Note (Signed)
Transition of Care [facility]) - CM/SW Discharge Note   Patient Details  Name: Gary Buck MRN: [id number] Date of Birth: 1966/02/23  Transition of Care [employer] [facility]) CM/SW Contact:  Oletta Cohn, RN Phone Number: 11/11/2018, 2:08 PM   Clinical Narrative:    ED CM consulted by EDP [place] for medication assistance. EDCM reviewed chart and spoke with the pt about [facility] MATCH program ($3 co pay for each Rx through [facility] program, does not include refills, 7 day expiration of MATCH letter and choice of pharmacies). Pt is eligible for [facility] MATCH program (unable to find pt listed in PROCARE per cardholder name inquiry) and has agreed to accept MATCH under terms discussed but can not afford co-pay PROCARE information entered. MATCH letter completed and TOC petty cash accessed for pt.  EDCM updated EDP and ED RN.      Final next level of care: Home/Self Care Barriers to Discharge: Barriers Resolved   Patient Goals and CMS Choice Patient states their goals for this hospitalization and ongoing recovery are:: stop this pain      Discharge Placement                       Discharge Plan and Services   Discharge Planning Services: CM Consult, MATCH Program               EDSW to get Rx from [facility] and deliver to pt room.                  Social Determinants of Health (SDOH) Interventions     Readmission Risk Interventions No flowsheet data found.

## 2018-11-11 NOTE — ED Notes (Signed)
Pt. Stated, Im communicating with my wife on cell phone. Question, can she bring me my charger for my phone? Yes to pt. Pt is going to be admitted. At this time not in need of anything

## 2018-11-12 ENCOUNTER — Inpatient Hospital Stay (HOSPITAL_COMMUNITY)
Admission: EM | Admit: 2018-11-12 | Discharge: 2018-11-14 | DRG: 281 | Disposition: A | Discharge status: Home / Self Care | Payer: Self-pay | Attending: Cardiology | Admitting: Cardiology

## 2018-11-12 ENCOUNTER — Inpatient Hospital Stay (HOSPITAL_COMMUNITY): Payer: Self-pay

## 2018-11-12 ENCOUNTER — Encounter (HOSPITAL_COMMUNITY)
Admission: EM | Disposition: A | Discharge status: Home / Self Care | Payer: Self-pay | Source: Home / Self Care | Attending: Cardiology

## 2018-11-12 ENCOUNTER — Ambulatory Visit (HOSPITAL_COMMUNITY): Admit: 2018-11-12 | Payer: Self-pay | Admitting: Cardiology

## 2018-11-12 DIAGNOSIS — F3160 Bipolar disorder, current episode mixed, unspecified: Secondary | ICD-10-CM | POA: Diagnosis present

## 2018-11-12 DIAGNOSIS — Z1159 Encounter for screening for other viral diseases: Secondary | ICD-10-CM

## 2018-11-12 DIAGNOSIS — Z8673 Personal history of transient ischemic attack (TIA), and cerebral infarction without residual deficits: Secondary | ICD-10-CM

## 2018-11-12 DIAGNOSIS — Z79899 Other long term (current) drug therapy: Secondary | ICD-10-CM

## 2018-11-12 DIAGNOSIS — E7800 Pure hypercholesterolemia, unspecified: Secondary | ICD-10-CM | POA: Diagnosis present

## 2018-11-12 DIAGNOSIS — I2121 ST elevation (STEMI) myocardial infarction involving left circumflex coronary artery: Principal | ICD-10-CM | POA: Diagnosis present

## 2018-11-12 DIAGNOSIS — Z7901 Long term (current) use of anticoagulants: Secondary | ICD-10-CM

## 2018-11-12 DIAGNOSIS — Z6830 Body mass index (BMI) 30.0-30.9, adult: Secondary | ICD-10-CM

## 2018-11-12 DIAGNOSIS — F1910 Other psychoactive substance abuse, uncomplicated: Secondary | ICD-10-CM | POA: Diagnosis present

## 2018-11-12 DIAGNOSIS — F1010 Alcohol abuse, uncomplicated: Secondary | ICD-10-CM | POA: Diagnosis present

## 2018-11-12 DIAGNOSIS — E669 Obesity, unspecified: Secondary | ICD-10-CM | POA: Diagnosis present

## 2018-11-12 DIAGNOSIS — Z7984 Long term (current) use of oral hypoglycemic drugs: Secondary | ICD-10-CM

## 2018-11-12 DIAGNOSIS — I10 Essential (primary) hypertension: Secondary | ICD-10-CM | POA: Diagnosis present

## 2018-11-12 DIAGNOSIS — I213 ST elevation (STEMI) myocardial infarction of unspecified site: Secondary | ICD-10-CM

## 2018-11-12 DIAGNOSIS — E876 Hypokalemia: Secondary | ICD-10-CM | POA: Diagnosis present

## 2018-11-12 DIAGNOSIS — Z833 Family history of diabetes mellitus: Secondary | ICD-10-CM

## 2018-11-12 DIAGNOSIS — Z716 Tobacco abuse counseling: Secondary | ICD-10-CM

## 2018-11-12 DIAGNOSIS — F1410 Cocaine abuse, uncomplicated: Secondary | ICD-10-CM | POA: Diagnosis present

## 2018-11-12 DIAGNOSIS — F17210 Nicotine dependence, cigarettes, uncomplicated: Secondary | ICD-10-CM | POA: Diagnosis present

## 2018-11-12 DIAGNOSIS — I2510 Atherosclerotic heart disease of native coronary artery without angina pectoris: Secondary | ICD-10-CM | POA: Diagnosis present

## 2018-11-12 DIAGNOSIS — E119 Type 2 diabetes mellitus without complications: Secondary | ICD-10-CM

## 2018-11-12 DIAGNOSIS — I480 Paroxysmal atrial fibrillation: Secondary | ICD-10-CM | POA: Diagnosis present

## 2018-11-12 DIAGNOSIS — E782 Mixed hyperlipidemia: Secondary | ICD-10-CM | POA: Diagnosis present

## 2018-11-12 HISTORY — PX: LEFT HEART CATH AND CORONARY ANGIOGRAPHY: CATH118249

## 2018-11-12 LAB — CBC WITH DIFFERENTIAL/PLATELET
Abs Immature Granulocytes: 0.06 10*3/uL (ref 0.00–0.07)
Basophils Absolute: 0 10*3/uL (ref 0.0–0.1)
Basophils Relative: 0 %
Eosinophils Absolute: 0 10*3/uL (ref 0.0–0.5)
Eosinophils Relative: 0 %
HCT: 54.8 % — ABNORMAL HIGH (ref 39.0–52.0)
Hemoglobin: 18.3 g/dL — ABNORMAL HIGH (ref 13.0–17.0)
Immature Granulocytes: 0 %
Lymphocytes Relative: 23 %
Lymphs Abs: 3.3 10*3/uL (ref 0.7–4.0)
MCH: 31.5 pg (ref 26.0–34.0)
MCHC: 33.4 g/dL (ref 30.0–36.0)
MCV: 94.3 fL (ref 80.0–100.0)
Monocytes Absolute: 1.1 10*3/uL — ABNORMAL HIGH (ref 0.1–1.0)
Monocytes Relative: 8 %
Neutro Abs: 9.6 10*3/uL — ABNORMAL HIGH (ref 1.7–7.7)
Neutrophils Relative %: 69 %
Platelets: 302 10*3/uL (ref 150–400)
RBC: 5.81 MIL/uL (ref 4.22–5.81)
RDW: 13.2 % (ref 11.5–15.5)
WBC: 14.1 10*3/uL — ABNORMAL HIGH (ref 4.0–10.5)
nRBC: 0 % (ref 0.0–0.2)

## 2018-11-12 LAB — CREATININE, SERUM
Creatinine, Ser: 1.42 mg/dL — ABNORMAL HIGH (ref 0.61–1.24)
GFR calc Af Amer: >60 mL/min (ref >60)
GFR calc non Af Amer: 56 mL/min — ABNORMAL LOW (ref >60)

## 2018-11-12 LAB — CBC
HCT: 56.5 % — ABNORMAL HIGH (ref 39.0–52.0)
Hemoglobin: 19.3 g/dL — ABNORMAL HIGH (ref 13.0–17.0)
MCH: 31.6 pg (ref 26.0–34.0)
MCHC: 34.2 g/dL (ref 30.0–36.0)
MCV: 92.6 fL (ref 80.0–100.0)
Platelets: 268 10*3/uL (ref 150–400)
RBC: 6.1 MIL/uL — ABNORMAL HIGH (ref 4.22–5.81)
RDW: 13.2 % (ref 11.5–15.5)
WBC: 13.9 10*3/uL — ABNORMAL HIGH (ref 4.0–10.5)
nRBC: 0 % (ref 0.0–0.2)

## 2018-11-12 LAB — HEMOGLOBIN A1C
Hgb A1c MFr Bld: 5.4 % (ref 4.8–5.6)
Mean Plasma Glucose: 108.28 mg/dL

## 2018-11-12 LAB — LIPID PANEL
Cholesterol: 192 mg/dL (ref 0–200)
HDL: 48 mg/dL (ref >40)
LDL Cholesterol: 118 mg/dL — ABNORMAL HIGH (ref 0–99)
Total CHOL/HDL Ratio: 4 RATIO
Triglycerides: 132 mg/dL (ref <150)
VLDL: 26 mg/dL (ref 0–40)

## 2018-11-12 LAB — SARS CORONAVIRUS 2 BY RT PCR (HOSPITAL ORDER, PERFORMED IN ~~LOC~~ HOSPITAL LAB): SARS Coronavirus 2: NEGATIVE

## 2018-11-12 LAB — TROPONIN I
Troponin I: <0.03 ng/mL (ref <0.03)
Troponin I: <0.03 ng/mL (ref <0.03)

## 2018-11-12 LAB — PROTIME-INR
INR: 1.2 (ref 0.8–1.2)
Prothrombin Time: 15.4 seconds — ABNORMAL HIGH (ref 11.4–15.2)

## 2018-11-12 LAB — COMPREHENSIVE METABOLIC PANEL
ALT: 14 U/L (ref 0–44)
AST: 18 U/L (ref 15–41)
Albumin: 4.2 g/dL (ref 3.5–5.0)
Alkaline Phosphatase: 74 U/L (ref 38–126)
Anion gap: 16 — ABNORMAL HIGH (ref 5–15)
BUN: 9 mg/dL (ref 6–20)
CO2: 25 mmol/L (ref 22–32)
Calcium: 9.7 mg/dL (ref 8.9–10.3)
Chloride: 98 mmol/L (ref 98–111)
Creatinine, Ser: 1.76 mg/dL — ABNORMAL HIGH (ref 0.61–1.24)
GFR calc Af Amer: 50 mL/min — ABNORMAL LOW (ref >60)
GFR calc non Af Amer: 43 mL/min — ABNORMAL LOW (ref >60)
Glucose, Bld: 146 mg/dL — ABNORMAL HIGH (ref 70–99)
Potassium: 3.4 mmol/L — ABNORMAL LOW (ref 3.5–5.1)
Sodium: 139 mmol/L (ref 135–145)
Total Bilirubin: 1.5 mg/dL — ABNORMAL HIGH (ref 0.3–1.2)
Total Protein: 7 g/dL (ref 6.5–8.1)

## 2018-11-12 LAB — APTT: aPTT: 28 seconds (ref 24–36)

## 2018-11-12 LAB — GLUCOSE, CAPILLARY: Glucose-Capillary: 113 mg/dL — ABNORMAL HIGH (ref 70–99)

## 2018-11-12 SURGERY — LEFT HEART CATH AND CORONARY ANGIOGRAPHY
Anesthesia: LOCAL

## 2018-11-12 MED ORDER — ENOXAPARIN SODIUM 40 MG/0.4ML ~~LOC~~ SOLN: 40.0000 mg | SUBCUTANEOUS | Status: DC

## 2018-11-12 MED ORDER — NICOTINE 14 MG/24HR TD PT24
14.0000 mg | MEDICATED_PATCH | Freq: Every day | TRANSDERMAL | Status: DC
  Administered 2018-11-13 – 2018-11-14 (×2): 14 mg via TRANSDERMAL
  Filled 2018-11-12 (×2): qty 1

## 2018-11-12 MED ORDER — VERAPAMIL HCL 2.5 MG/ML IV SOLN
INTRAVENOUS | Status: DC | PRN
  Administered 2018-11-12: 19:00:00 10 mL via INTRA_ARTERIAL

## 2018-11-12 MED ORDER — NITROGLYCERIN 1 MG/10 ML FOR IR/CATH LAB
INTRA_ARTERIAL | Status: AC
  Filled 2018-11-12: qty 10

## 2018-11-12 MED ORDER — INSULIN ASPART 100 UNIT/ML ~~LOC~~ SOLN
0.0000 [IU] | Freq: Three times a day (TID) | SUBCUTANEOUS | Status: DC
  Administered 2018-11-13: 13:00:00 3 [IU] via SUBCUTANEOUS

## 2018-11-12 MED ORDER — HYDRALAZINE HCL 20 MG/ML IJ SOLN: 10.0000 mg | INTRAMUSCULAR | Status: AC | PRN

## 2018-11-12 MED ORDER — CLOPIDOGREL BISULFATE 75 MG PO TABS: 75.0000 mg | ORAL_TABLET | Freq: Every day | ORAL | Status: DC

## 2018-11-12 MED ORDER — SODIUM CHLORIDE 0.9 % IV BOLUS
500.0000 mL | Freq: Once | INTRAVENOUS | Status: AC
  Administered 2018-11-12: 500 mL via INTRAVENOUS

## 2018-11-12 MED ORDER — RISPERIDONE 1 MG PO TABS
1.0000 mg | ORAL_TABLET | Freq: Two times a day (BID) | ORAL | Status: DC
  Administered 2018-11-12 – 2018-11-14 (×4): 1 mg via ORAL
  Filled 2018-11-12 (×6): qty 1

## 2018-11-12 MED ORDER — LIDOCAINE HCL (PF) 1 % IJ SOLN
INTRAMUSCULAR | Status: DC | PRN
  Administered 2018-11-12: 2 mL via INTRADERMAL

## 2018-11-12 MED ORDER — SODIUM CHLORIDE 0.9 % IV SOLN
INTRAVENOUS | Status: DC
  Administered 2018-11-13: 06:00:00 via INTRAVENOUS

## 2018-11-12 MED ORDER — LABETALOL HCL 5 MG/ML IV SOLN: 10.0000 mg | INTRAVENOUS | Status: AC | PRN

## 2018-11-12 MED ORDER — CLOPIDOGREL BISULFATE 75 MG PO TABS
300.0000 mg | ORAL_TABLET | Freq: Once | ORAL | Status: AC
  Administered 2018-11-12: 300 mg via ORAL
  Filled 2018-11-12: qty 4

## 2018-11-12 MED ORDER — HEPARIN (PORCINE) IN NACL 1000-0.9 UT/500ML-% IV SOLN
INTRAVENOUS | Status: AC
  Filled 2018-11-12: qty 1000

## 2018-11-12 MED ORDER — ACETAMINOPHEN 325 MG PO TABS: 650.0000 mg | ORAL_TABLET | ORAL | Status: DC | PRN

## 2018-11-12 MED ORDER — AMLODIPINE BESYLATE 10 MG PO TABS
10.0000 mg | ORAL_TABLET | Freq: Every day | ORAL | Status: DC
  Administered 2018-11-13 – 2018-11-14 (×2): 10 mg via ORAL
  Filled 2018-11-12: qty 2
  Filled 2018-11-12: qty 1

## 2018-11-12 MED ORDER — LISINOPRIL 40 MG PO TABS
40.0000 mg | ORAL_TABLET | Freq: Every day | ORAL | Status: DC
  Administered 2018-11-13 – 2018-11-14 (×2): 40 mg via ORAL
  Filled 2018-11-12 (×2): qty 1

## 2018-11-12 MED ORDER — CLOPIDOGREL BISULFATE 75 MG PO TABS
75.0000 mg | ORAL_TABLET | Freq: Every day | ORAL | Status: DC
  Administered 2018-11-13 – 2018-11-14 (×2): 75 mg via ORAL
  Filled 2018-11-12 (×2): qty 1

## 2018-11-12 MED ORDER — HEPARIN SODIUM (PORCINE) 5000 UNIT/ML IJ SOLN
4000.0000 [IU] | Freq: Once | INTRAMUSCULAR | Status: AC
  Administered 2018-11-12: 19:00:00 4000 [IU] via INTRAVENOUS

## 2018-11-12 MED ORDER — HYDROCHLOROTHIAZIDE 25 MG PO TABS: 25.0000 mg | ORAL_TABLET | Freq: Every day | ORAL | Status: DC

## 2018-11-12 MED ORDER — ATORVASTATIN CALCIUM 80 MG PO TABS
80.0000 mg | ORAL_TABLET | Freq: Every day | ORAL | Status: DC
  Administered 2018-11-13: 80 mg via ORAL
  Filled 2018-11-12: qty 1

## 2018-11-12 MED ORDER — VERAPAMIL HCL 2.5 MG/ML IV SOLN
INTRAVENOUS | Status: AC
  Filled 2018-11-12: qty 2

## 2018-11-12 MED ORDER — OXCARBAZEPINE 300 MG PO TABS
300.0000 mg | ORAL_TABLET | Freq: Two times a day (BID) | ORAL | Status: DC
  Administered 2018-11-12 – 2018-11-14 (×4): 300 mg via ORAL
  Filled 2018-11-12 (×6): qty 1

## 2018-11-12 MED ORDER — CARVEDILOL 6.25 MG PO TABS
6.2500 mg | ORAL_TABLET | Freq: Two times a day (BID) | ORAL | Status: DC
  Administered 2018-11-12 – 2018-11-13 (×3): 6.25 mg via ORAL
  Filled 2018-11-12: qty 2
  Filled 2018-11-12: qty 1
  Filled 2018-11-12: qty 2
  Filled 2018-11-12: qty 1

## 2018-11-12 MED ORDER — NITROGLYCERIN 0.4 MG SL SUBL: 0.4000 mg | SUBLINGUAL_TABLET | SUBLINGUAL | Status: DC | PRN

## 2018-11-12 MED ORDER — HEPARIN (PORCINE) IN NACL 1000-0.9 UT/500ML-% IV SOLN
INTRAVENOUS | Status: DC | PRN
  Administered 2018-11-12 (×2): 500 mL

## 2018-11-12 MED ORDER — SODIUM CHLORIDE 0.9% FLUSH
3.0000 mL | Freq: Two times a day (BID) | INTRAVENOUS | Status: DC
  Administered 2018-11-13 – 2018-11-14 (×2): 3 mL via INTRAVENOUS

## 2018-11-12 MED ORDER — LIDOCAINE HCL (PF) 1 % IJ SOLN
INTRAMUSCULAR | Status: AC
  Filled 2018-11-12: qty 30

## 2018-11-12 MED ORDER — ASPIRIN 81 MG PO CHEW: 324.0000 mg | CHEWABLE_TABLET | Freq: Once | ORAL | Status: DC

## 2018-11-12 MED ORDER — ONDANSETRON HCL 4 MG/2ML IJ SOLN: 4.0000 mg | Freq: Four times a day (QID) | INTRAMUSCULAR | Status: DC | PRN

## 2018-11-12 MED ORDER — IOHEXOL 350 MG/ML SOLN
INTRAVENOUS | Status: DC | PRN
  Administered 2018-11-12: 100 mL via INTRA_ARTERIAL

## 2018-11-12 MED ORDER — SODIUM CHLORIDE 0.9% FLUSH: 3.0000 mL | INTRAVENOUS | Status: DC | PRN

## 2018-11-12 MED ORDER — SODIUM CHLORIDE 0.9 % IV SOLN: 250.0000 mL | INTRAVENOUS | Status: DC | PRN

## 2018-11-12 MED ORDER — SODIUM CHLORIDE 0.9 % WEIGHT BASED INFUSION
1.0000 mL/kg/h | INTRAVENOUS | Status: AC
  Administered 2018-11-12: 1 mL/kg/h via INTRAVENOUS

## 2018-11-12 MED ORDER — SODIUM CHLORIDE 0.9 % IV SOLN
INTRAVENOUS | Status: AC | PRN
  Administered 2018-11-12: 10 mL/h via INTRAVENOUS

## 2018-11-12 SURGICAL SUPPLY — 14 items
CATH 5FR JL3.5 JR4 ANG PIG MP (CATHETERS) ×1 IMPLANT
CATH VISTA GUIDE 6FR XBLAD3.5 (CATHETERS) ×1 IMPLANT
DEVICE RAD COMP TR BAND LRG (VASCULAR PRODUCTS) ×1 IMPLANT
ELECT DEFIB PAD ADLT CADENCE (PAD) ×1 IMPLANT
GLIDESHEATH SLEND SS 6F .021 (SHEATH) ×1 IMPLANT
GUIDEWIRE INQWIRE 1.5J.035X260 (WIRE) IMPLANT
INQWIRE 1.5J .035X260CM (WIRE) ×2
KIT ENCORE 26 ADVANTAGE (KITS) ×1 IMPLANT
KIT HEART LEFT (KITS) ×2 IMPLANT
PACK CARDIAC CATHETERIZATION (CUSTOM PROCEDURE TRAY) ×2 IMPLANT
SHEATH PROBE COVER 6X72 (BAG) ×1 IMPLANT
SYR MEDRAD MARK 7 150ML (SYRINGE) ×2 IMPLANT
TRANSDUCER W/STOPCOCK (MISCELLANEOUS) ×2 IMPLANT
TUBING CIL FLEX 10 FLL-RA (TUBING) ×2 IMPLANT

## 2018-11-12 NOTE — ED Triage Notes (Signed)
Per [facility] pt paged out as code stemi. Was in friends [place]iece when experienced central chest pain, non radiating. Patient alert and orientated. Diaphoretic on arrival. Given aspirin and 1 nitro PTA.

## 2018-11-12 NOTE — H&P (Addendum)
Cardiology Admission History and Physical:   Patient ID: Gary Buck MRN: [id number]; DOB: 01-Oct-1965   Admission date: 11/12/2018  Primary Care Provider: Patient, No Pcp Per Primary Cardiologist: Chrystie Nose, MD  Primary Electrophysiologist:  None   Chief Complaint:  Chest Pain/ STEMI  Patient Profile:   Gary Buck is a 53 y.o. 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  History of Present Illness:   Gary Buck is a 53 year old male with a history of non-obstructive CAD with 30% proximal LAD stenosis on cardiac catheterization in 2012, recurrent paroxysmal atrial fibrillation, remote stroke in 2004, polysubstance abuse, and medication non-compliance who is followed by Dr. Rennis Golden.  Patient was seen in the ED yesterday for evaluation of hypertension. He stated at that time he had not taken his medications since the beginning of COVID-19 pandemic about 2 months ago. However, medications had not been refilled since 04/2018. He was having chest pain at that time but patient reports this was chronic and denied any new chest pain. Troponin negative yesterday and EKG showed no ST/T changes. Home medications were refilled and patient was discharged.   Patient returns to ED today with STEMI. History obtained from patient and EMS. Patient reportedly finished with work today and was sitting in a friend's car he started to complain of dizziness and then had a syncopal episode that lasted for about 1 minute. EMS was called and on their arrival, patient was found to have ST elevation in lateral leads with reciprocal changes in inferior leads. Patient complained of some substernal non-radiating chest pressure that he ranked as a 7-8/10 on the pain scale at  that time. He also reports some shortness of breath. En route to ED, patient had another brief syncopal episode that lasted about 10 seconds. Patient received Aspirin  and Nitro from EMS.   Patient dipahoretic in the ED and still having some chest pain but states it has improved. Patient stopped in ED for COVID-19 testing and then taken immediately to cath lab for emergency cardiac catheterization once cath lab staff arrived.  Past Medical History:  Diagnosis Date  . Anginal pain (HCC)   . Atrial fibrillation (HCC) 2017  . Bipolar disorder (HCC)   . Depression   . ETOH abuse   . H/O cocaine abuse (HCC)   . Hypercholesterolemia   . Hypertension   . PAF (paroxysmal atrial fibrillation) (HCC)    [name] 12/21/2014  . Stroke [facility]) 2004   pt sts about 12 years ago.   . Tobacco abuse   . Type II diabetes mellitus (HCC) dx'd 12/21/2014    Past Surgical History:  Procedure Laterality Date  . CYSTECTOMY  ~ 2004   "on the top of my forehead"  . INGUINAL HERNIA REPAIR Left ~ 2012     Medications Prior to Admission: Prior to Admission medications   Medication Sig Start Date End Date Taking? Authorizing Provider  amLODipine (NORVASC) 10 MG tablet Take 1 tablet (10 mg total) by mouth daily. 11/11/18   Henderly, Britni A, PA-C  apixaban (ELIQUIS) 5 MG TABS tablet Take 1 tablet (5 mg total) by mouth 2 (two) times daily. 11/11/18   Henderly, Britni A, PA-C  carvedilol (COREG) 6.25 MG tablet Take 1 tablet (6.25 mg total) by mouth 2 (two) times daily. Dose change 11/11/18  02/09/19  Henderly, Britni A, PA-C  chlorhexidine (PERIDEX) 0.12 % solution Use as directed 15 mLs in the mouth or throat 2 (two) times daily. 03/11/18   Harlene Salts A, PA-C  hydrochlorothiazide (HYDRODIURIL) 25 MG tablet Take 1 tablet (25 mg total) by mouth daily. Take on tablet in the morning. 11/11/18   Henderly, Britni A, PA-C  lisinopril (ZESTRIL) 40 MG tablet Take 1 tablet (40 mg total) by mouth daily. 11/11/18   Henderly,  Britni A, PA-C  metFORMIN (GLUCOPHAGE) 500 MG tablet Take 1 tablet (500 mg total) by mouth 2 (two) times daily with a meal. 11/11/18   Henderly, Britni A, PA-C  nicotine (NICODERM CQ - DOSED IN MG/24 HOURS) 14 mg/24hr patch Place 1 patch (14 mg total) onto the skin daily. 06/28/17   Hilty, Lisette Abu, MD  nitroGLYCERIN (NITROSTAT) 0.4 MG SL tablet Place 1 tablet (0.4 mg total) under the tongue every 5 (five) minutes x 3 doses as needed for chest pain. 07/30/17   Hilty, Lisette Abu, MD  ondansetron (ZOFRAN) 4 MG tablet Take 1 tablet (4 mg total) by mouth every 6 (six) hours. 09/07/18   Terrilee Files, MD  risperiDONE (RISPERDAL) 1 MG tablet Take 1 tablet (1 mg total) by mouth 2 (two) times daily. 01/21/18   Storm Frisk, MD  TRILEPTAL 300 MG tablet Take 1 tablet (300 mg total) by mouth 2 (two) times daily. 11/11/18   Henderly, Britni A, PA-C     Allergies:   No Known Allergies  Social History:   Social History   Socioeconomic History  . Marital status: Single    Spouse name: Not on file  . Number of children: Not on file  . Years of education: Not on file  . Highest education level: Not on file  Occupational History  . Not on file  Social Needs  . Financial resource strain: Not on file  . Food insecurity:    Worry: Not on file    Inability: Not on file  . Transportation needs:    Medical: Not on file    Non-medical: Not on file  Tobacco Use  . Smoking status: Current Every Day Smoker    Packs/day: 1.00    Years: 32.00    Pack years: 32.00    Types: Cigarettes  . Smokeless tobacco: Never Used  Substance and Sexual Activity  . Alcohol use: Yes    Alcohol/week: 40.0 standard drinks    Types: 40 Cans of beer per week  . Drug use: Yes    Types: Marijuana, Cocaine    Comment: last used crack 03/08/18, last marijuana 03/10/18  . Sexual activity: Yes  Lifestyle  . Physical activity:    Days per week: Not on file    Minutes per session: Not on file  . Stress: Not on file   Relationships  . Social connections:    Talks on phone: Not on file    Gets together: Not on file    Attends religious service: Not on file    Active member of club or organization: Not on file    Attends meetings of clubs or organizations: Not on file    Relationship status: Not on file  . Intimate partner violence:    Fear of current or ex partner: Not on file    Emotionally abused: Not on file    Physically abused: Not on file    Forced sexual activity: Not on file  Other Topics Concern  . Not on  file  Social History Narrative  . Not on file    Family History:   The patient's family history includes Diabetes Mellitus II in his mother.    ROS:  Please see the history of present illness.  Full ROS could not be obtained due to need for emergent cardiac catheterization.  Physical Exam/Data:  There were no vitals filed for this visit. No intake or output data in the 24 hours ending 11/12/18 1845 Last 3 Weights 11/11/2018 09/07/2018 03/13/2018  Weight (lbs) 320 lb 310 lb 313 lb  Weight (kg) 145.151 kg 140.615 kg 141.976 kg  Some encounter information is confidential and restricted. Go to Review Flowsheets activity to see all data.     There is no height or weight on file to calculate BMI.   GENERAL:  Obese BM in moderate distress HEENT:  PERRL, EOMI, sclera are clear. Oropharynx is clear. NECK:  No jugular venous distention, carotid upstroke brisk and symmetric, no bruits, no thyromegaly or adenopathy LUNGS:  Clear to auscultation bilaterally CHEST:  Unremarkable HEART:  RRR,  PMI not displaced or sustained,S1 and S2 within normal limits, no S3, no S4: no clicks, no rubs, no murmurs ABD:  Soft, nontender. BS +, no masses or bruits. No hepatomegaly, no splenomegaly EXT:  2 + pulses throughout, no edema, no cyanosis no clubbing SKIN:  Warm and dry.  No rashes NEURO:  Alert and oriented x 3. Cranial nerves II through XII intact. PSYCH:  Cognitively intact    EKG:  The ECG  that was done  was personally reviewed and demonstrates sinus rhythm with ST elevations in anterolateral leads.  Relevant CV Studies:   Laboratory Data:  Chemistry Recent Labs  Lab 11/11/18 1228  NA 141  K 4.0  CL 102  CO2 20*  GLUCOSE 81  BUN 7  CREATININE 0.95  CALCIUM 10.0  GFRNONAA >60  GFRAA >60  ANIONGAP 19*    Recent Labs  Lab 11/11/18 1228  PROT 8.4*  ALBUMIN 4.7  AST 33  ALT 20  ALKPHOS 91  BILITOT QUANTITY NOT SUFFICIENT, UNABLE TO PERFORM TEST   Hematology Recent Labs  Lab 11/11/18 1202  WBC 13.4*  RBC 6.17*  HGB 19.6*  HCT 60.9*  MCV 98.7  MCH 31.8  MCHC 32.2  RDW 13.7  PLT 264   Cardiac Enzymes Recent Labs  Lab 11/11/18 1228  TROPONINI <0.03   No results for input(s): TROPIPOC in the last 168 hours.  BNPNo results for input(s): BNP, PROBNP in the last 168 hours.  DDimer No results for input(s): DDIMER in the last 168 hours.  Radiology/Studies:  No results found.  Assessment and Plan:   STEMI - Patient presents with STEMI following syncopal episode and complaints of chest pain. - EKG show ST elevations in anterolateral leads with recirpirocal changes in inferior leads. - IV Heparin started in the ED. - Patient taken immediately to cath lab for emergency cardiac catheterization.  - Further recommendations to follow.  Paroxysmal Atrial Fibrillation - EKG shows sinus rhythm. Rate controlled. - On Coreg 6.25mg  twice daily at home but patient has history of non-compliance. - IV Heparin started in ED. Resume Eliquis after cath.  Hypertension - Patient has been non-compliant with home medications which include Amlodipine  daily, Coreg 6.25mg  twice daily, HCTZ  daily, Lisinopril  daily. - BP in ED 92/56.  Polysubstance Abuse - Patient admitted to cocaine use last week.   Severity of Illness: The appropriate patient status for this patient is INPATIENT.  Inpatient status is judged to be reasonable and necessary in order  to provide the required intensity of service to ensure the patient's safety. The patient's presenting symptoms, physical exam findings, and initial radiographic and laboratory data in the context of their chronic comorbidities is felt to place them at high risk for further clinical deterioration. Furthermore, it is not anticipated that the patient will be medically stable for discharge from the hospital within 2 midnights of admission. The following factors support the patient status of inpatient.   " The patient's presenting symptoms include chest pain and syncope. " The worrisome physical exam findings include diaphoresis. " The initial radiographic and laboratory data are worrisome because of ST elevations on EKG. " The chronic co-morbidities include hypertension, atrial fibrillation, CAD.   * I certify that at the point of admission it is my clinical judgment that the patient will require inpatient hospital care spanning beyond 2 midnights from the point of admission due to high intensity of service, high risk for further deterioration and high frequency of surveillance required.*    For questions or updates, please contact [facility] Please consult [email or web address] for contact info under        Signed, Corrin ParkerCallie E Goodrich, PA-C  11/12/2018 6:45 PM

## 2018-11-12 NOTE — ED Provider Notes (Signed)
[facility] EMERGENCY DEPARTMENT Provider Note   CSN: [id number] Arrival date & time:       History   Chief Complaint Chief Complaint  Patient presents with  . Code STEMI    HPI Gary Buck is a 53 y.o. male.     The history is provided by the patient and the EMS personnel.  Chest Pain  Pain location:  Substernal area Pain quality: crushing   Pain radiates to:  Does not radiate Pain severity:  Moderate Onset quality:  Gradual Timing:  Constant Progression:  Unchanged Chronicity:  New Context: at rest   Relieved by:  Nothing Worsened by:  Nothing Associated symptoms: shortness of breath   Associated symptoms: no abdominal pain, no back pain, no cough, no fever, no palpitations and no vomiting   Risk factors: diabetes mellitus, high cholesterol, hypertension and male sex   Risk factors comment:  Prior cocaine use   Past Medical History:  Diagnosis Date  . Anginal pain (HCC)   . Atrial fibrillation (HCC) 2017  . Bipolar disorder (HCC)   . Depression   . ETOH abuse   . H/O cocaine abuse (HCC)   . Hypercholesterolemia   . Hypertension   . PAF (paroxysmal atrial fibrillation) (HCC)    [name] 12/21/2014  . Stroke [facility]) 2004   pt sts about 12 years ago.   . Tobacco abuse   . Type II diabetes mellitus (HCC) dx'd 12/21/2014    Patient Active Problem List   Diagnosis Date Noted  . Mixed hyperlipidemia 06/28/2017  . Hypertension 11/18/2016  . Obesity (BMI 30-39.9) 11/18/2016  . PAF (paroxysmal atrial fibrillation) (HCC) 11/17/2016  . Hypercholesterolemia 11/17/2016  . Type II diabetes mellitus (HCC) 11/17/2016  . Bipolar disorder (HCC) 11/17/2016  . ETOH abuse 11/17/2016  . Tobacco abuse 11/17/2016  . Bipolar affective disorder, mixed, severe (HCC) 06/28/2016  . Bipolar affective disorder, current episode mixed, without psychotic features (HCC) 06/28/2016  . Atrial fibrillation, unspecified   . Elevated troponin   . Atrial fibrillation  with RVR (HCC) 12/21/2014  . Coronary artery disease involving native coronary artery of native heart without angina pectoris 12/21/2014  . Chest pain   . Essential hypertension   . Remote history of stroke   . Substance abuse [facility])     Past Surgical History:  Procedure Laterality Date  . CYSTECTOMY  ~ 2004   "on the top of my forehead"  . INGUINAL HERNIA REPAIR Left ~ 2012        Home Medications    Prior to Admission medications   Medication Sig Start Date End Date Taking? Authorizing Provider  amLODipine (NORVASC) 10 MG tablet Take 1 tablet (10 mg total) by mouth daily. 11/11/18   Henderly, Britni A, PA-C  apixaban (ELIQUIS) 5 MG TABS tablet Take 1 tablet (5 mg total) by mouth 2 (two) times daily. 11/11/18   Henderly, Britni A, PA-C  carvedilol (COREG) 6.25 MG tablet Take 1 tablet (6.25 mg total) by mouth 2 (two) times daily. Dose change 11/11/18 02/09/19  Henderly, Britni A, PA-C  chlorhexidine (PERIDEX) 0.12 % solution Use as directed 15 mLs in the mouth or throat 2 (two) times daily. 03/11/18   Harlene Salts A, PA-C  hydrochlorothiazide (HYDRODIURIL) 25 MG tablet Take 1 tablet (25 mg total) by mouth daily. Take on tablet in the morning. 11/11/18   Henderly, Britni A, PA-C  lisinopril (ZESTRIL) 40 MG tablet Take 1 tablet (40 mg total) by mouth daily. 11/11/18  Henderly, Britni A, PA-C  metFORMIN (GLUCOPHAGE) 500 MG tablet Take 1 tablet (500 mg total) by mouth 2 (two) times daily with a meal. 11/11/18   Henderly, Britni A, PA-C  nicotine (NICODERM CQ - DOSED IN MG/24 HOURS) 14 mg/24hr patch Place 1 patch (14 mg total) onto the skin daily. 06/28/17   Hilty, Lisette Abu, MD  nitroGLYCERIN (NITROSTAT) 0.4 MG SL tablet Place 1 tablet (0.4 mg total) under the tongue every 5 (five) minutes x 3 doses as needed for chest pain. 07/30/17   Hilty, Lisette Abu, MD  ondansetron (ZOFRAN) 4 MG tablet Take 1 tablet (4 mg total) by mouth every 6 (six) hours. 09/07/18   Terrilee Files, MD  risperiDONE  (RISPERDAL) 1 MG tablet Take 1 tablet (1 mg total) by mouth 2 (two) times daily. 01/21/18   Storm Frisk, MD  TRILEPTAL 300 MG tablet Take 1 tablet (300 mg total) by mouth 2 (two) times daily. 11/11/18   Henderly, Britni A, PA-C    Family History Family History  Problem Relation Age of Onset  . Diabetes Mellitus II Mother     Social History Social History   Tobacco Use  . Smoking status: Current Every Day Smoker    Packs/day: 1.00    Years: 32.00    Pack years: 32.00    Types: Cigarettes  . Smokeless tobacco: Never Used  Substance Use Topics  . Alcohol use: Yes    Alcohol/week: 40.0 standard drinks    Types: 40 Cans of beer per week  . Drug use: Yes    Types: Marijuana, Cocaine    Comment: last used crack 03/08/18, last marijuana 03/10/18     Allergies   Patient has no known allergies.   Review of Systems Review of Systems  Constitutional: Negative for chills and fever.  HENT: Negative for ear pain and sore throat.   Eyes: Negative for pain and visual disturbance.  Respiratory: Positive for shortness of breath. Negative for cough.   Cardiovascular: Positive for chest pain. Negative for palpitations.  Gastrointestinal: Negative for abdominal pain and vomiting.  Genitourinary: Negative for dysuria and hematuria.  Musculoskeletal: Negative for arthralgias and back pain.  Skin: Negative for color change and rash.  Neurological: Negative for seizures and syncope.  All other systems reviewed and are negative.    Physical Exam Updated Vital Signs  ED Triage Vitals  Enc Vitals Group     BP 11/12/18 1841 (!) 92/56     Pulse Rate 11/12/18 1841 70     Resp 11/12/18 1841 15     Temp 11/12/18 1852 (!) 97.5 F (36.4 C)     Temp Source 11/12/18 1852 Oral     SpO2 11/12/18 1841 99 %     Weight 11/12/18 1852 (!) 320 lb (145.2 kg)     Height 11/12/18 1852  (1.956 m)     Head Circumference --      Peak Flow --      Pain Score --      Pain Loc --      Pain Edu? --       Excl. in GC? --     Physical Exam Vitals signs and nursing note reviewed.  Constitutional:      General: He is in acute distress.     Appearance: He is well-developed. He is ill-appearing and diaphoretic.  HENT:     Head: Normocephalic and atraumatic.     Nose: Nose normal.  Eyes:  Conjunctiva/sclera: Conjunctivae normal.     Pupils: Pupils are equal, round, and reactive to light.  Neck:     Musculoskeletal: Normal range of motion and neck supple.  Cardiovascular:     Rate and Rhythm: Normal rate and regular rhythm.     Pulses: Normal pulses.     Heart sounds: Normal heart sounds. No murmur.  Pulmonary:     Effort: Pulmonary effort is normal. No respiratory distress.     Breath sounds: Normal breath sounds.  Abdominal:     General: There is no distension.     Palpations: Abdomen is soft.     Tenderness: There is no abdominal tenderness.  Skin:    General: Skin is warm.     Capillary Refill: Capillary refill takes less than 2 seconds.  Neurological:     General: No focal deficit present.     Mental Status: He is alert.      ED Treatments / Results  Labs (all labs ordered are listed, but only abnormal results are displayed) Labs Reviewed  SARS CORONAVIRUS 2 (HOSPITAL ORDER, PERFORMED IN [facility])  CBC WITH DIFFERENTIAL/PLATELET  PROTIME-INR  APTT  COMPREHENSIVE METABOLIC PANEL  TROPONIN I  LIPID PANEL  RAPID URINE DRUG SCREEN, HOSP PERFORMED    EKG EKG Interpretation  Date/Time:  Wednesday Nov 12 2018 18:45:31 EDT Ventricular Rate:  64 PR Interval:    QRS Duration: 106 QT Interval:  513 QTC Calculation: 530 R Axis:   35 Text Interpretation:  Sinus rhythm LAE, consider biatrial enlargement ST depr, consider ischemia, inferior leads  ST elevation, anterolateral leads, STEMI Prolonged QT interval Confirmed by Virgina Norfolk [phone]) on 11/12/2018 6:48:29 PM   Radiology Dg Chest 2 View  Result Date: 11/11/2018 CLINICAL DATA:   Shortness of breath with exertion. Loss of appetite. Extremity pain. EXAM: CHEST - 2 VIEW COMPARISON:  Radiographs 12/13/2017 and 06/08/2017. FINDINGS: The heart size and mediastinal contours are normal. The lungs are clear. There is no pleural effusion or pneumothorax. No acute osseous findings are identified. Telemetry leads overlie the chest. IMPRESSION: Stable chest.  No active cardiopulmonary process. Electronically Signed   By: Carey Bullocks M.D.   On: 11/11/2018 12:01   Ct Head Wo Contrast  Result Date: 11/11/2018 CLINICAL DATA:  Headache. EXAM: CT HEAD WITHOUT CONTRAST TECHNIQUE: Contiguous axial images were obtained from the base of the skull through the vertex without intravenous contrast. COMPARISON:  CT scan of June 26, 2010. FINDINGS: Brain: No evidence of acute infarction, hemorrhage, hydrocephalus, extra-axial collection or mass lesion/mass effect. Vascular: No hyperdense vessel or unexpected calcification. Skull: Normal. Negative for fracture or focal lesion. Sinuses/Orbits: No acute finding. Other: None. IMPRESSION: Normal head CT. Electronically Signed   By: Lupita Raider M.D.   On: 11/11/2018 12:20    Procedures .Critical Care Performed by: Virgina Norfolk, DO Authorized by: Virgina Norfolk, DO   Critical care provider statement:    Critical care time (minutes):  35   Critical care was necessary to treat or prevent imminent or life-threatening deterioration of the following conditions:  Cardiac failure   Critical care was time spent personally by me on the following activities:  Blood draw for specimens, development of treatment plan with patient or surrogate, discussions with primary provider, evaluation of patient's response to treatment, examination of patient, obtaining history from patient or surrogate, ordering and performing treatments and interventions, ordering and review of laboratory studies, ordering and review of radiographic studies, pulse oximetry, re-evaluation  of patient's  condition and review of old charts   I assumed direction of critical care for this patient from another provider in my specialty: no     (including critical care time)  EMERGENCY DEPARTMENT  US GUIDANCE EXAM Emergency Ultrasound:  US Guidance for Needle Guidance  INDICATIONS: Difficult vascular access Linear probe used in real-time to visualize location of needle entry through skin.   PERFORMED BY: Myself IMAGES ARCHIVED?: No LIMITATIONS: None VIEWS USED: Transverse INTERPRETATION: Right arm   Medications Ordered in ED Medications  0.9 %  sodium chloride infusion (has no administration in time range)  aspirin chewable tablet 324 mg (324 mg Oral Not Given 11/12/18 1850)  heparin injection 4,000 Units (4,000 Units Intravenous Given 11/12/18 1850)  sodium chloride 0.9 % bolus 500 mL (500 mLs Intravenous New Bag/Given 11/12/18 1854)     Initial Impression / Assessment and Plan / ED Course  I have reviewed the triage vital signs and the nursing notes.  Pertinent labs & imaging results that were available during my care of the patient were reviewed by me and considered in my medical decision making (see chart for details).     Aniello Mayford KnifeWilliams is a 53 year old male with history of hypertension, high cholesterol, diabetes, cocaine abuse who presents to the ED as a code STEMI.  EKG prior to arrival shows signs of anterior lateral ischemia with ST depressions inferiorly.  EKG upon arrival shows ST elevation anteriorly as well as laterally with depressions inferiorly.  aVL also elevated.  Significant change from prior EKG.  Dr. SwazilandJordan with cardiology was down the ED upon arrival patient.  I placed the ultrasound-guided IV as EMS was unable to get access.  Will start heparin and aspirin.  Lab work and chest x-ray were ordered but patient went directly to the Cath Lab given EKG signs showing ACS.  Patient blood pressure was 92/68 after nitroglycerin with EMS.  Will give saline bolus.   Patient remained hemodynamically stable throughout my care and went to the Cath Lab.  This chart was dictated using voice recognition software.  Despite best efforts to proofread,  errors can occur which can change the documentation meaning.    Final Clinical Impressions(s) / ED Diagnoses   Final diagnoses:  ST elevation myocardial infarction (STEMI), unspecified artery [facility](HCC)    ED Discharge Orders    None       Virgina NorfolkCuratolo, Thadeus Gandolfi, DO 11/12/18 1902

## 2018-11-13 ENCOUNTER — Encounter (HOSPITAL_COMMUNITY): Payer: Self-pay | Admitting: Cardiology

## 2018-11-13 ENCOUNTER — Inpatient Hospital Stay (HOSPITAL_COMMUNITY): Payer: Self-pay

## 2018-11-13 DIAGNOSIS — I2510 Atherosclerotic heart disease of native coronary artery without angina pectoris: Secondary | ICD-10-CM

## 2018-11-13 LAB — GLUCOSE, CAPILLARY
Glucose-Capillary: 119 mg/dL — ABNORMAL HIGH (ref 70–99)
Glucose-Capillary: 130 mg/dL — ABNORMAL HIGH (ref 70–99)
Glucose-Capillary: 98 mg/dL (ref 70–99)
Glucose-Capillary: 117 mg/dL — ABNORMAL HIGH (ref 70–99)

## 2018-11-13 LAB — CBC
HCT: 52.9 % — ABNORMAL HIGH (ref 39.0–52.0)
Hemoglobin: 18 g/dL — ABNORMAL HIGH (ref 13.0–17.0)
MCH: 31.3 pg (ref 26.0–34.0)
MCHC: 34 g/dL (ref 30.0–36.0)
MCV: 91.8 fL (ref 80.0–100.0)
Platelets: 277 10*3/uL (ref 150–400)
RBC: 5.76 MIL/uL (ref 4.22–5.81)
RDW: 13.4 % (ref 11.5–15.5)
WBC: 12.3 10*3/uL — ABNORMAL HIGH (ref 4.0–10.5)
nRBC: 0 % (ref 0.0–0.2)

## 2018-11-13 LAB — BASIC METABOLIC PANEL
Anion gap: 16 — ABNORMAL HIGH (ref 5–15)
BUN: 12 mg/dL (ref 6–20)
CO2: 23 mmol/L (ref 22–32)
Calcium: 9.3 mg/dL (ref 8.9–10.3)
Chloride: 98 mmol/L (ref 98–111)
Creatinine, Ser: 1.6 mg/dL — ABNORMAL HIGH (ref 0.61–1.24)
GFR calc Af Amer: 57 mL/min — ABNORMAL LOW (ref >60)
GFR calc non Af Amer: 49 mL/min — ABNORMAL LOW (ref >60)
Glucose, Bld: 113 mg/dL — ABNORMAL HIGH (ref 70–99)
Potassium: 3.1 mmol/L — ABNORMAL LOW (ref 3.5–5.1)
Sodium: 137 mmol/L (ref 135–145)

## 2018-11-13 LAB — LIPID PANEL
Cholesterol: 174 mg/dL (ref 0–200)
HDL: 43 mg/dL (ref >40)
LDL Cholesterol: 102 mg/dL — ABNORMAL HIGH (ref 0–99)
Total CHOL/HDL Ratio: 4 RATIO
Triglycerides: 144 mg/dL (ref <150)
VLDL: 29 mg/dL (ref 0–40)

## 2018-11-13 LAB — HIV ANTIBODY (ROUTINE TESTING W REFLEX): HIV Screen 4th Generation wRfx: NONREACTIVE

## 2018-11-13 LAB — TROPONIN I
Troponin I: <0.03 ng/mL (ref <0.03)
Troponin I: <0.03 ng/mL (ref <0.03)

## 2018-11-13 LAB — ECHOCARDIOGRAM COMPLETE
Height: 77 in
Weight: 5120 oz

## 2018-11-13 LAB — MRSA PCR SCREENING: MRSA by PCR: NEGATIVE

## 2018-11-13 MED ORDER — POTASSIUM CHLORIDE CRYS ER 20 MEQ PO TBCR
40.0000 meq | EXTENDED_RELEASE_TABLET | Freq: Once | ORAL | Status: AC
  Administered 2018-11-13: 40 meq via ORAL
  Filled 2018-11-13: qty 2

## 2018-11-13 MED ORDER — APIXABAN 5 MG PO TABS
5.0000 mg | ORAL_TABLET | Freq: Two times a day (BID) | ORAL | Status: DC
  Administered 2018-11-13 – 2018-11-14 (×3): 5 mg via ORAL
  Filled 2018-11-13 (×3): qty 1

## 2018-11-13 MED ORDER — CHLORHEXIDINE GLUCONATE CLOTH 2 % EX PADS
6.0000 | MEDICATED_PAD | Freq: Every day | CUTANEOUS | Status: DC
  Administered 2018-11-14: 6 via TOPICAL

## 2018-11-13 MED FILL — Nitroglycerin IV Soln 100 MCG/ML in D5W: INTRA_ARTERIAL | Qty: 10 | Status: AC

## 2018-11-13 NOTE — Care Management (Addendum)
Consult for PCP and meds. Patient states that he has been to [facility] and would like to go again. Patient will have follow up made at [facility], CMA will call him and confirm date and time. Verified his number listed is correct.   MATCH letter attempted to be placed in system, it came up with a red banner. Paged Mariea Stable, [facility] resource for [facility] at [phone] for assistance. Awaiting to hear back from [facility] for assistance with correcting banner in [employer] to ensure it will process.   Patient is currently listed in system from 5/26 to 6/3. He was provided with a new MATCH letter to have with these dates.  He states he was seen in the ED two days ago and was given a bag of meds that included eliquis, and he has used the coupon before.   He understands that it is important to get the follow up appointment so he can have refills. He states he has transport.

## 2018-11-13 NOTE — Progress Notes (Signed)
CARDIAC REHAB PHASE I   PRE:  Rate/Rhythm: 75 SR  BP:  Supine: 155/103, 155/87  Sitting:   Standing:    SaO2: 98%RA  MODE:  Ambulation: 300 ft   POST:  Rate/Rhythm: 86 SR  BP:  Supine: 156/91  Sitting:   Standing:    SaO2: 98%RA 1345-1500 Pt walked 150 ft on RA and asked to sit in chair due to chest pressure. 2 on scale. Got chair and pt rested. Pt walked back to room and he said it was 3-4. Had pt lie down and take deep breaths and he stated it eased up. MI education completed except for ex ed. Need to walk with pt again tomorrow and see how he tolerates. Reviewed MI restrictions, NTG use, importance of plavix, watching carbs and heart healthy food choices, smoking cessation and ETOH cessation. Pt stated he plans to quit cold [place] but he is wearing patch now. Stated he uses smoking to deal with stress. Gave handout and encouraged him to call 1800quitnow as needed. Pt stated he is going to try to quit drinking also. Discussed CRP 2 and referred to [facility] program. Got pt's email as he is interested in virtual ex.  We will follow up tomorrow. No further c/o CP.   Luetta Nutting, RN BSN  11/13/2018 2:51 PM

## 2018-11-13 NOTE — Progress Notes (Signed)
Progress Note  Patient Name: Gary Buck Date of Encounter: 11/13/2018  Primary Cardiologist: Chrystie Nose, MD   Subjective   Feels tired but otherwise feels well. Denies any chest pain or SOB currently.  Inpatient Medications    Scheduled Meds: . amLODipine  10 mg Oral Daily  . aspirin  324 mg Oral Once  . atorvastatin  80 mg Oral q1800  . carvedilol  6.25 mg Oral BID  . Chlorhexidine Gluconate Cloth  6 each Topical Daily  . clopidogrel  75 mg Oral Q breakfast  . enoxaparin (LOVENOX) injection  40 mg Subcutaneous Q24H  . hydrochlorothiazide  25 mg Oral Daily  . insulin aspart  0-20 Units Subcutaneous TID WC  . lisinopril  40 mg Oral Daily  . nicotine  14 mg Transdermal Daily  . Oxcarbazepine  300 mg Oral BID  . risperiDONE  1 mg Oral BID  . sodium chloride flush  3 mL Intravenous Q12H   Continuous Infusions: . sodium chloride 10 mL/hr at 11/13/18 0700  . sodium chloride     PRN Meds: sodium chloride, acetaminophen, nitroGLYCERIN, ondansetron (ZOFRAN) IV, sodium chloride flush   Vital Signs    Vitals:   11/13/18 0400 11/13/18 0500 11/13/18 0600 11/13/18 0700  BP:  (!) 169/99 (!) 155/87   Pulse:  68 72 70  Resp: (!) 24 16 19 18   Temp:      TempSrc:      SpO2:  98% 96% 96%  Weight:      Height:        Intake/Output Summary (Last 24 hours) at 11/13/2018 0718 Last data filed at 11/13/2018 0700 Gross per 24 hour  Intake 446.67 ml  Output 400 ml  Net 46.67 ml   Last 3 Weights 11/12/2018 11/11/2018 09/07/2018  Weight (lbs) 320 lb 320 lb 310 lb  Weight (kg) 145.151 kg 145.151 kg 140.615 kg  Some encounter information is confidential and restricted. Go to Review Flowsheets activity to see all data.      Telemetry    NSR - Personally Reviewed  ECG    NSR LVH with repolarization abnormality. ST elevation in lateral leads resolved. - Personally Reviewed  Physical Exam   GEN: No acute distress.  obese Neck: No JVD Cardiac: RRR, no murmurs, rubs, or  gallops.  Respiratory: Clear to auscultation bilaterally. GI: Soft, nontender, non-distended  MS: No edema; No deformity. Radial cath site without hematoma Neuro:  Nonfocal  Psych: Normal affect   Labs    Chemistry Recent Labs  Lab 11/11/18 1228 11/12/18 1839 11/12/18 2126 11/13/18 0312  NA 141 139  --  137  K 4.0 3.4*  --  3.1*  CL 102 98  --  98  CO2 20* 25  --  23  GLUCOSE 81 146*  --  113*  BUN 7 9  --  12  CREATININE 0.95 1.76* 1.42* 1.60*  CALCIUM 10.0 9.7  --  9.3  PROT 8.4* 7.0  --   --   ALBUMIN 4.7 4.2  --   --   AST 33 18  --   --   ALT 20 14  --   --   ALKPHOS 91 74  --   --   BILITOT QUANTITY NOT SUFFICIENT, UNABLE TO PERFORM TEST 1.5*  --   --   GFRNONAA >60 43* 56* 49*  GFRAA >60 50* >60 57*  ANIONGAP 19* 16*  --  16*     Hematology Recent Labs  Lab  11/12/18 1839 11/12/18 2126 11/13/18 0312  WBC 14.1* 13.9* 12.3*  RBC 5.81 6.10* 5.76  HGB 18.3* 19.3* 18.0*  HCT 54.8* 56.5* 52.9*  MCV 94.3 92.6 91.8  MCH 31.5 31.6 31.3  MCHC 33.4 34.2 34.0  RDW 13.2 13.2 13.4  PLT 302 268 277    Cardiac Enzymes Recent Labs  Lab 11/11/18 1228 11/12/18 1839 11/12/18 2126  TROPONINI <0.03 <0.03 <0.03   No results for input(s): TROPIPOC in the last 168 hours.   BNPNo results for input(s): BNP, PROBNP in the last 168 hours.   DDimer No results for input(s): DDIMER in the last 168 hours.   Radiology    Dg Chest 2 View  Result Date: 11/11/2018 CLINICAL DATA:  Shortness of breath with exertion. Loss of appetite. Extremity pain. EXAM: CHEST - 2 VIEW COMPARISON:  Radiographs 12/13/2017 and 06/08/2017. FINDINGS: The heart size and mediastinal contours are normal. The lungs are clear. There is no pleural effusion or pneumothorax. No acute osseous findings are identified. Telemetry leads overlie the chest. IMPRESSION: Stable chest.  No active cardiopulmonary process. Electronically Signed   By: Carey BullocksWilliam  Veazey M.D.   On: 11/11/2018 12:01   Ct Head Wo Contrast   Result Date: 11/11/2018 CLINICAL DATA:  Headache. EXAM: CT HEAD WITHOUT CONTRAST TECHNIQUE: Contiguous axial images were obtained from the base of the skull through the vertex without intravenous contrast. COMPARISON:  CT scan of June 26, 2010. FINDINGS: Brain: No evidence of acute infarction, hemorrhage, hydrocephalus, extra-axial collection or mass lesion/mass effect. Vascular: No hyperdense vessel or unexpected calcification. Skull: Normal. Negative for fracture or focal lesion. Sinuses/Orbits: No acute finding. Other: None. IMPRESSION: Normal head CT. Electronically Signed   By: Lupita RaiderJames  Green Jr M.D.   On: 11/11/2018 12:20   Dg Chest Port 1 View  Result Date: 11/12/2018 CLINICAL DATA:  Shortness of breath today. EXAM: PORTABLE CHEST 1 VIEW COMPARISON:  PA and lateral chest 11/11/2018 and 06/08/2017. FINDINGS: Lungs are clear. Heart size is normal. No pneumothorax or pleural fluid. No acute or focal bony abnormality. IMPRESSION: Negative chest. Electronically Signed   By: Drusilla Kannerhomas  Dalessio M.D.   On: 11/12/2018 20:52    Cardiac Studies   Procedures   LEFT HEART CATH AND CORONARY ANGIOGRAPHY  Conclusion     Mid LAD lesion is 40% stenosed.  Dist Cx lesion is 99% stenosed.  Lat 1st Mrg lesion is 70% stenosed.  Mid RCA lesion is 30% stenosed.  The left ventricular systolic function is normal.  LV end diastolic pressure is normal.  The left ventricular ejection fraction is greater than 65% by visual estimate.  There is no mitral valve regurgitation.   1. 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 This vessel is too small for PCI 2. Hyperdynamic LV function 3. Normal LVEDP  Plan: recommend medical management. I would favor 12 months of antiplatelet therapy with Plavix in addition to Eliquis which he takes for Afib.       Patient Profile     53 y.o. male with history of Bipolar disorder, HTN, HLD, PAfib, polysubstance abuse including tobacco,  cocaine, and Etoh. Presented with acute lateral STEMI  Assessment & Plan    1. Acute lateral STEMI. Secondary to small distal LCx occlusion. Too small for PCI. Otherwise nonobstructive disease. Initial troponin negative x3. Ecg is back to baseline. There may have been a component of spasm. Will manage medically. He is on Eliquis long term due to Afib. Will add Plavix for ACS. Will  transfer to telemetry today and ambulate. Possible DC in am. 2. HTN. BP somewhat labile overnight. Has not taken any medication since November. Will get back on antihypertensive regimen and monitor. 3. HLD. LDL 118. Now on high dose statin.  4. Tobacco abuse. Counseled on importance of cessation 5. Cocaine abuse intermittent.  Counseled on importance of cessation6. 6. Etoh abuse. Patient admits to drinking up to a fifth or a gallon of liquor a day.  7. Paroxysmal AFib. In NSR. Will continue Eliquis.   8. CKD. Creatinine 1.6. will hold HCTZ. Monitor. 9. Hypokalemia will replete.   For questions or updates, please contact [facility] Please consult [email or web address] for contact info under        Signed, Liya Strollo [place], MD  11/13/2018, 7:18 AM

## 2018-11-13 NOTE — Progress Notes (Signed)
  Echocardiogram 2D Echocardiogram has been performed.  Shanon Becvar L Androw 11/13/2018, 9:23 AM

## 2018-11-13 NOTE — Plan of Care (Signed)
Bp much better. Education needed to be reinforced on diet, ETOH, medications and other drug interactions. Patient is speaking about his diet changing.

## 2018-11-14 LAB — BASIC METABOLIC PANEL
Anion gap: 12 (ref 5–15)
BUN: 18 mg/dL (ref 6–20)
CO2: 25 mmol/L (ref 22–32)
Calcium: 9.6 mg/dL (ref 8.9–10.3)
Chloride: 100 mmol/L (ref 98–111)
Creatinine, Ser: 0.84 mg/dL (ref 0.61–1.24)
GFR calc Af Amer: >60 mL/min (ref >60)
GFR calc non Af Amer: >60 mL/min (ref >60)
Glucose, Bld: 101 mg/dL — ABNORMAL HIGH (ref 70–99)
Potassium: 3.6 mmol/L (ref 3.5–5.1)
Sodium: 137 mmol/L (ref 135–145)

## 2018-11-14 LAB — GLUCOSE, CAPILLARY: Glucose-Capillary: 111 mg/dL — ABNORMAL HIGH (ref 70–99)

## 2018-11-14 MED ORDER — CLOPIDOGREL BISULFATE 75 MG PO TABS: 75.0000 mg | ORAL_TABLET | Freq: Every day | ORAL | 1 refills | Status: AC

## 2018-11-14 MED ORDER — CARVEDILOL 12.5 MG PO TABS: 12.5000 mg | ORAL_TABLET | Freq: Two times a day (BID) | ORAL | 2 refills | Status: DC

## 2018-11-14 MED ORDER — CARVEDILOL 12.5 MG PO TABS
12.5000 mg | ORAL_TABLET | Freq: Two times a day (BID) | ORAL | Status: DC
  Administered 2018-11-14: 10:00:00 12.5 mg via ORAL
  Filled 2018-11-14: qty 1

## 2018-11-14 MED ORDER — ATORVASTATIN CALCIUM 80 MG PO TABS: 80.0000 mg | ORAL_TABLET | Freq: Every day | ORAL | 1 refills | Status: AC

## 2018-11-14 MED FILL — CLOPIDOGREL 75 MG TABLET: 75 | 30 days supply | Qty: 30 | Fill #0

## 2018-11-14 MED FILL — CARVEDILOL 12.5 MG TABLET: 12.5 | 30 days supply | Qty: 60 | Fill #0

## 2018-11-14 MED FILL — ATORVASTATIN CALCIUM 80 MG: 80 | 30 days supply | Qty: 30 | Fill #0

## 2018-11-14 NOTE — Progress Notes (Signed)
Progress Note  Patient Name: Gary Buck Date of Encounter: 11/14/2018  Primary Cardiologist: Chrystie Nose, MD   Subjective   Feels great today. No chest pain with ambulation. Anxious to go home. Denies any chest pain or SOB currently.  Inpatient Medications    Scheduled Meds: . amLODipine  10 mg Oral Daily  . apixaban  5 mg Oral BID  . aspirin  324 mg Oral Once  . atorvastatin  80 mg Oral q1800  . carvedilol  12.5 mg Oral BID  . Chlorhexidine Gluconate Cloth  6 each Topical Daily  . clopidogrel  75 mg Oral Q breakfast  . insulin aspart  0-20 Units Subcutaneous TID WC  . lisinopril  40 mg Oral Daily  . nicotine  14 mg Transdermal Daily  . Oxcarbazepine  300 mg Oral BID  . risperiDONE  1 mg Oral BID  . sodium chloride flush  3 mL Intravenous Q12H   Continuous Infusions: . sodium chloride 10 mL/hr at 11/13/18 0700  . sodium chloride     PRN Meds: sodium chloride, acetaminophen, nitroGLYCERIN, ondansetron (ZOFRAN) IV, sodium chloride flush   Vital Signs    Vitals:   11/13/18 1400 11/13/18 1825 11/13/18 1943 11/14/18 0341  BP: (!) 155/103 (!) 143/82 (!) 144/88 (!) 157/86  Pulse:   95 (!) 53  Resp: (!) 21  20 (!) 21  Temp:   98.1 F (36.7 C) 97.7 F (36.5 C)  TempSrc:   Oral Oral  SpO2:   99% 97%  Weight:      Height:        Intake/Output Summary (Last 24 hours) at 11/14/2018 0742 Last data filed at 11/14/2018 0325 Gross per 24 hour  Intake 840 ml  Output 900 ml  Net -60 ml   Last 3 Weights 11/12/2018 11/11/2018 09/07/2018  Weight (lbs) 320 lb 320 lb 310 lb  Weight (kg) 145.151 kg 145.151 kg 140.615 kg  Some encounter information is confidential and restricted. Go to Review Flowsheets activity to see all data.      Telemetry    NSR - Personally Reviewed  ECG    NSR LVH with repolarization abnormality. ST elevation in lateral leads resolved. - Personally Reviewed  Physical Exam   GEN: No acute distress.  obese Neck: No JVD Cardiac: RRR, no  murmurs, rubs, or gallops.  Respiratory: Clear to auscultation bilaterally. GI: Soft, nontender, non-distended  MS: No edema; No deformity. Radial cath site without hematoma Neuro:  Nonfocal  Psych: Normal affect   Labs    Chemistry Recent Labs  Lab 11/11/18 1228 11/12/18 1839 11/12/18 2126 11/13/18 0312 11/14/18 0357  NA 141 139  --  137 137  K 4.0 3.4*  --  3.1* 3.6  CL 102 98  --  98 100  CO2 20* 25  --  23 25  GLUCOSE 81 146*  --  113* 101*  BUN 7 9  --  12 18  CREATININE 0.95 1.76* 1.42* 1.60* 0.84  CALCIUM 10.0 9.7  --  9.3 9.6  PROT 8.4* 7.0  --   --   --   ALBUMIN 4.7 4.2  --   --   --   AST 33 18  --   --   --   ALT 20 14  --   --   --   ALKPHOS 91 74  --   --   --   BILITOT QUANTITY NOT SUFFICIENT, UNABLE TO PERFORM TEST 1.5*  --   --   --  GFRNONAA >60 43* 56* 49* >60  GFRAA >60 50* >60 57* >60  ANIONGAP 19* 16*  --  16* 12     Hematology Recent Labs  Lab 11/12/18 1839 11/12/18 2126 11/13/18 0312  WBC 14.1* 13.9* 12.3*  RBC 5.81 6.10* 5.76  HGB 18.3* 19.3* 18.0*  HCT 54.8* 56.5* 52.9*  MCV 94.3 92.6 91.8  MCH 31.5 31.6 31.3  MCHC 33.4 34.2 34.0  RDW 13.2 13.2 13.4  PLT 302 268 277    Cardiac Enzymes Recent Labs  Lab 11/12/18 1839 11/12/18 2126 11/13/18 0312 11/13/18 0810  TROPONINI <0.03 <0.03 <0.03 <0.03   No results for input(s): TROPIPOC in the last 168 hours.   BNPNo results for input(s): BNP, PROBNP in the last 168 hours.   DDimer No results for input(s): DDIMER in the last 168 hours.   Radiology    Dg Chest Port 1 View  Result Date: 11/12/2018 CLINICAL DATA:  Shortness of breath today. EXAM: PORTABLE CHEST 1 VIEW COMPARISON:  PA and lateral chest 11/11/2018 and 06/08/2017. FINDINGS: Lungs are clear. Heart size is normal. No pneumothorax or pleural fluid. No acute or focal bony abnormality. IMPRESSION: Negative chest. Electronically Signed   By: Drusilla Kannerhomas  Dalessio M.D.   On: 11/12/2018 20:52    Cardiac Studies   Procedures    LEFT HEART CATH AND CORONARY ANGIOGRAPHY  Conclusion     Mid LAD lesion is 40% stenosed.  Dist Cx lesion is 99% stenosed.  Lat 1st Mrg lesion is 70% stenosed.  Mid RCA lesion is 30% stenosed.  The left ventricular systolic function is normal.  LV end diastolic pressure is normal.  The left ventricular ejection fraction is greater than 65% by visual estimate.  There is no mitral valve regurgitation.   1. 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 This vessel is too small for PCI 2. Hyperdynamic LV function 3. Normal LVEDP  Plan: recommend medical management. I would favor 12 months of antiplatelet therapy with Plavix in addition to Eliquis which he takes for Afib.       Patient Profile     53 y.o. male with history of Bipolar disorder, HTN, HLD, PAfib, polysubstance abuse including tobacco, cocaine, and Etoh. Presented with acute lateral STEMI  Assessment & Plan    1. Acute lateral STEMI. Secondary to small distal LCx occlusion. Too small for PCI. Otherwise nonobstructive disease. Initial troponin negative x3. Ecg is back to baseline. There may have been a component of spasm. Will manage medically. He is on Eliquis long term due to Afib. Will add Plavix for ACS. Plan DC home today. 2. HTN. BP still elevated. Has not taken any medication since November. Will get back on antihypertensive regimen and monitor. I will increase Coreg to 12.5 mg bid today. [facility] 3. HLD. LDL 118. Now on high dose statin.  4. Tobacco abuse. Counseled on importance of cessation. Motivated to quit 5. Cocaine abuse intermittent.  Counseled on importance of cessation 6. Etoh abuse. Patient admits to drinking up to a fifth or a gallon of liquor a day.  7. Paroxysmal AFib. In NSR. Will continue Eliquis.   8. CKD. Creatinine up to 1.6. Now 0.84. HCTZ stopped.  Monitor. 9. Hypokalemia  repleted.   For questions or updates, please contact  [facility] Please consult [email or web address] for contact info under        Signed, Yexalen Deike [place], MD  11/14/2018, 7:42 AM

## 2018-11-14 NOTE — Discharge Summary (Signed)
Discharge Summary    Patient ID: Gary Buck,  MRN: [id number], DOB/AGE: 1965-10-06 53 y.o.  Admit date: 11/12/2018 Discharge date: 11/14/2018  Primary Care Provider: Patient, No Pcp Per Primary Cardiologist: Dr. Rennis Golden   Discharge Diagnoses    Principal Problem:   Acute ST elevation myocardial infarction (STEMI) involving left circumflex coronary artery [facility]) Active Problems:   Essential hypertension   Remote history of stroke   Substance abuse (HCC)   Bipolar affective disorder, current episode mixed, without psychotic features (HCC)   PAF (paroxysmal atrial fibrillation) (HCC)   Hypercholesterolemia   Type II diabetes mellitus (HCC)   Obesity (BMI 30-39.9)   STEMI involving left circumflex coronary artery (HCC)   Allergies No Known Allergies  Diagnostic Studies/Procedures    Cath: 11/12/18   Mid LAD lesion is 40% stenosed.  Dist Cx lesion is 99% stenosed.  Lat 1st Mrg lesion is 70% stenosed.  Mid RCA lesion is 30% stenosed.  The left ventricular systolic function is normal.  LV end diastolic pressure is normal.  The left ventricular ejection fraction is greater than 65% by visual estimate.  There is no mitral valve regurgitation.   1. 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 This vessel is too small for PCI 2. Hyperdynamic LV function 3. Normal LVEDP  Plan: recommend medical management. I would favor 12 months of antiplatelet therapy with Plavix in addition to Eliquis which he takes for Afib.   Diagnostic  Dominance: Right    TTE: 11/13/18  IMPRESSIONS    1. The left ventricle has normal systolic function with an ejection fraction of 60-65%. The cavity size was normal. There is mildly increased left ventricular wall thickness. Left ventricular diastolic Doppler parameters are consistent with impaired  relaxation. No evidence of left ventricular regional wall motion abnormalities.  2. The right ventricle has normal  systolic function. The cavity was normal. There is no increase in right ventricular wall thickness.  3. Right atrial size was mildly dilated.  4. No obvious wall motion abnormalities detected. 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 Rennis Golden.  Patient was seen in the ED the day prior to admission for evaluation of hypertension. He stated at that time he had not taken his medications since the beginning of COVID-19 pandemic about 2 months ago. However, medications had not been refilled since 04/2018. He was having chest pain at that time but patient reported this was chronic and denied any new chest pain. Troponin negative yesterday and EKG showed no ST/T changes. Home medications were refilled and patient was discharged.   Patient returned to ED 11/12/18 with STEMI. History obtained from patient and EMS. Patient reportedly finished with work and was sitting in a friend's car he started to complain of dizziness and then had a syncopal episode that lasted for about 1 minute. EMS was called and on their arrival, patient was found to have ST elevation in lateral leads with reciprocal changes in inferior leads. Patient complained of some substernal non-radiating chest pressure that he ranked as a 7-8/10 on the pain scale at that time. He also reported some shortness of breath. En route to ED, patient had another brief syncopal episode that lasted about 10 seconds.  Patient received Aspirin 324mg  and Nitro from EMS.   Patient dipahoretic while in the ED and still having some chest pain but stated it had improved. Patient stopped in ED for COVID-19 testing and then taken immediately to cath lab for emergency cardiac catheterization  once cath lab staff arrived.  Hospital Course      1. Acute lateral STEMI: Secondary to small distal LCx occlusion. Too small for PCI. Otherwise nonobstructive disease. Initial troponin negative x3. Ecg returned back to baseline. There may have been a component of spasm. Will plan to manage medically. He will be continued on Eliquis long term due to Afib. Added Plavix for ACS.   2. HTN: BP was elevated on admission. He had not taken any medication since November. He was started back on his antihypertensive regimen. Coreg was increased to 12.5 mg bid.  3. HLD: LDL 118. Now on high dose statin. Will need FLP/LFTs in 6 weeks.   4. Tobacco abuse: Counseled on importance of cessation. Motivated to quit  5. Cocaine abuse intermittent:  Counseled on importance of cessation  6. Etoh abuse: Patient admits to drinking up to a fifth or a gallon of liquor a day. Counseled on the need for cessation especially given the need for [facility].  7. Paroxysmal AFib: In NSR this admission. Will continue Eliquis.     8. CKD: Creatinine up to 1.6. Now 0.84. HCTZ stopped. Follow as an outpatient.  [name] Knife was seen by Dr. Swaziland and determined stable for discharge home. Follow up in the office has been arranged. Medications are listed below.   _____________  Discharge Vitals Blood pressure (!) 165/90, pulse (!) 53, temperature 98.8 F (37.1 C), temperature source Oral, resp. rate (!) 21, height 6\' 5"  (1.956 m), weight (!) 145.2 kg, SpO2 99 %.  Filed Weights   11/12/18 1852  Weight: (!) 145.2 kg    Labs & Radiologic Studies    CBC Recent Labs    11/11/18 1202 11/12/18 1839 11/12/18 2126 11/13/18 0312  WBC 13.4* 14.1* 13.9* 12.3*  NEUTROABS 9.5* 9.6*  --   --   HGB 19.6* 18.3* 19.3* 18.0*  HCT 60.9* 54.8* 56.5* 52.9*  MCV 98.7 94.3 92.6 91.8  PLT 264 302 268 277   Basic Metabolic Panel Recent Labs    01/60/10 0312 11/14/18 0357  NA 137 137  K 3.1* 3.6  CL 98 100  CO2 23 25  GLUCOSE  113* 101*  BUN 12 18  CREATININE 1.60* 0.84  CALCIUM 9.3 9.6   Liver Function Tests Recent Labs    11/11/18 1228 11/12/18 1839  AST 33 18  ALT 20 14  ALKPHOS 91 74  BILITOT QUANTITY NOT SUFFICIENT, UNABLE TO PERFORM TEST 1.5*  PROT 8.4* 7.0  ALBUMIN 4.7 4.2   No results for input(s): LIPASE, AMYLASE in the last 72 hours. Cardiac Enzymes Recent Labs    11/12/18 2126 11/13/18 0312 11/13/18 0810  TROPONINI <0.03 <0.03 <0.03   BNP Invalid input(s): POCBNP D-Dimer No results for input(s): DDIMER in the last 72 hours. Hemoglobin A1C Recent Labs    11/12/18 2126  HGBA1C 5.4   Fasting Lipid Panel Recent Labs    11/13/18 0312  CHOL 174  HDL 43  LDLCALC 102*  TRIG 144  CHOLHDL 4.0   Thyroid Function Tests No results for input(s): TSH, T4TOTAL, T3FREE, THYROIDAB in the last 72 hours.  Invalid input(s): FREET3 _____________  Dg Chest 2 View  Result Date: 11/11/2018 CLINICAL DATA:  Shortness of breath  with exertion. Loss of appetite. Extremity pain. EXAM: CHEST - 2 VIEW COMPARISON:  Radiographs 12/13/2017 and 06/08/2017. FINDINGS: The heart size and mediastinal contours are normal. The lungs are clear. There is no pleural effusion or pneumothorax. No acute osseous findings are identified. Telemetry leads overlie the chest. IMPRESSION: Stable chest.  No active cardiopulmonary process. Electronically Signed   By: Carey Bullocks M.D.   On: 11/11/2018 12:01   Ct Head Wo Contrast  Result Date: 11/11/2018 CLINICAL DATA:  Headache. EXAM: CT HEAD WITHOUT CONTRAST TECHNIQUE: Contiguous axial images were obtained from the base of the skull through the vertex without intravenous contrast. COMPARISON:  CT scan of June 26, 2010. FINDINGS: Brain: No evidence of acute infarction, hemorrhage, hydrocephalus, extra-axial collection or mass lesion/mass effect. Vascular: No hyperdense vessel or unexpected calcification. Skull: Normal. Negative for fracture or focal lesion. Sinuses/Orbits:  No acute finding. Other: None. IMPRESSION: Normal head CT. Electronically Signed   By: Lupita Raider M.D.   On: 11/11/2018 12:20   Dg Chest Port 1 View  Result Date: 11/12/2018 CLINICAL DATA:  Shortness of breath today. EXAM: PORTABLE CHEST 1 VIEW COMPARISON:  PA and lateral chest 11/11/2018 and 06/08/2017. FINDINGS: Lungs are clear. Heart size is normal. No pneumothorax or pleural fluid. No acute or focal bony abnormality. IMPRESSION: Negative chest. Electronically Signed   By: Drusilla Kanner M.D.   On: 11/12/2018 20:52   Disposition   Pt is being discharged home today in good condition.  Follow-up Plans & Appointments    Follow-up Information    [facility]. Go on 11/24/2018.   Specialty:  Internal Medicine Why:  hospital followup at 9:20am with Raliegh Ip, NP Contact information: [address] [place] [address] [id number] [address] [place] [address] [phone]       Jodelle Gross, NP Follow up on 12/26/2018.   Specialties:  Nurse Practitioner, Radiology, Cardiology Why:  at 9:15am for your follow up appt. This will be a virtual visit done through your mobile phone.  Contact information: [address] [place] [address] [phone]          Discharge Instructions    Amb Referral to Cardiac Rehabilitation   Complete by:  As directed    Diagnosis:  STEMI   After initial evaluation and assessments completed: Virtual Based Care may be provided alone or in conjunction with Phase 2 Cardiac Rehab based on patient barriers.:  Yes Comment - [email or web address]   Call MD for:  redness, tenderness, or signs of infection (pain, swelling, redness, odor or green/yellow discharge around incision site)   Complete by:  As directed    Diet - low sodium heart healthy   Complete by:  As directed    Discharge instructions   Complete by:  As directed    Radial Site Care Refer to this sheet in the next few weeks. These instructions provide you  with information on caring for yourself after your procedure. Your caregiver may also give you more specific instructions. Your treatment has been planned according to current medical practices, but problems sometimes occur. Call your caregiver if you have any problems or questions after your procedure. HOME CARE INSTRUCTIONS You may shower the day after the procedure.Remove the bandage (dressing) and gently wash the site with plain soap and water.Gently pat the site dry.  Do not apply powder or lotion to the site.  Do not submerge the affected site in water for 3 to 5 days.  Inspect the site  at least twice daily.  Do not flex or bend the affected arm for 24 hours.  No lifting over 5 pounds (2.3 kg) for 5 days after your procedure.  Do not drive home if you are discharged the same day of the procedure. Have someone else drive you.  You may drive 24 hours after the procedure unless otherwise instructed by your caregiver.  What to expect: Any bruising will usually fade within 1 to 2 weeks.  Blood that collects in the tissue (hematoma) may be painful to the touch. It should usually decrease in size and tenderness within 1 to 2 weeks.  SEEK IMMEDIATE MEDICAL CARE IF: You have unusual pain at the radial site.  You have redness, warmth, swelling, or pain at the radial site.  You have drainage (other than a small amount of blood on the dressing).  You have chills.  You have a fever or persistent symptoms for more than 72 hours.  You have a fever and your symptoms suddenly get worse.  Your arm becomes pale, cool, tingly, or numb.  You have heavy bleeding from the site. Hold pressure on the site.   Increase activity slowly   Complete by:  As directed        Discharge Medications     Medication List    STOP taking these medications   hydrochlorothiazide 25 MG tablet Commonly known as:  HYDRODIURIL     TAKE these medications   amLODipine 10 MG tablet Commonly known as:  NORVASC Take 1  tablet (10 mg total) by mouth daily.   apixaban 5 MG Tabs tablet Commonly known as:  ELIQUIS Take 1 tablet (5 mg total) by mouth 2 (two) times daily.   atorvastatin 80 MG tablet Commonly known as:  LIPITOR Take 1 tablet (80 mg total) by mouth daily at 6 PM.   carvedilol 12.5 MG tablet Commonly known as:  COREG Take 1 tablet (12.5 mg total) by mouth 2 (two) times daily. What changed:    medication strength  how much to take  additional instructions   chlorhexidine 0.12 % solution Commonly known as:  Peridex Use as directed 15 mLs in the mouth or throat 2 (two) times daily.   clopidogrel 75 MG tablet Commonly known as:  PLAVIX Take 1 tablet (75 mg total) by mouth daily with breakfast. Start taking on:  Nov 15, 2018   lisinopril 40 MG tablet Commonly known as:  ZESTRIL Take 1 tablet (40 mg total) by mouth daily.   metFORMIN 500 MG tablet Commonly known as:  GLUCOPHAGE Take 1 tablet (500 mg total) by mouth 2 (two) times daily with a meal.   nicotine 14 mg/24hr patch Commonly known as:  NICODERM CQ - dosed in mg/24 hours Place 1 patch (14 mg total) onto the skin daily.   nitroGLYCERIN 0.4 MG SL tablet Commonly known as:  NITROSTAT Place 1 tablet (0.4 mg total) under the tongue every 5 (five) minutes x 3 doses as needed for chest pain.   ondansetron 4 MG tablet Commonly known as:  ZOFRAN Take 1 tablet (4 mg total) by mouth every 6 (six) hours.   risperiDONE 1 MG tablet Commonly known as:  RISPERDAL Take 1 tablet (1 mg total) by mouth 2 (two) times daily.   Trileptal 300 MG tablet Generic drug:  Oxcarbazepine Take 1 tablet (300 mg total) by mouth 2 (two) times daily.        Acute coronary syndrome (MI, NSTEMI, STEMI, etc) this admission?: Yes.  AHA/ACC Clinical Performance & Quality Measures: 1. Aspirin prescribed? - No - on OAC 2. ADP Receptor Inhibitor (Plavix/Clopidogrel, Brilinta/Ticagrelor or Effient/Prasugrel) prescribed (includes medically managed  patients)? - Yes 3. Beta Blocker prescribed? - Yes 4. High Intensity Statin (Lipitor 40-80mg  or Crestor 20-40mg ) prescribed? - Yes 5. EF assessed during THIS hospitalization? - Yes 6. For EF <40%, was ACEI/ARB prescribed? - Not Applicable (EF >/= 40%) 7. For EF <40%, Aldosterone Antagonist (Spironolactone or Eplerenone) prescribed? - Not Applicable (EF >/= 40%) 8. Cardiac Rehab Phase II ordered (Included Medically managed Patients)? - Yes   Outstanding Labs/Studies   FLP/LFTs in 6 weeks if tolerating statin.   Duration of Discharge Encounter   Greater than 30 minutes including physician time.  Signed, Laverda PageLindsay Glorine Hanratty NP-C 11/14/2018, 11:41 AM

## 2018-11-14 NOTE — Progress Notes (Signed)
CARDIAC REHAB PHASE I   PRE:  Rate/Rhythm: Sinus 74  BP:    Sitting: 174/96     SaO2: 98% Room Air  MODE:  Ambulation: 450 ft   POST:  Rate/Rhythm: 78  BP:    Sitting: 156/91     SaO2: 100% Room Air  [phone] Patient ambulated independently in the hallway without complaints or chest pain. Tolerated well. Reviewed exercise instructions  RPE scale, temperature precautions  And end point of exercise with patient. Given phase 2 brochure. Reinforced smoking cessation with the patient.  Thayer Headings RN BSN

## 2018-11-17 ENCOUNTER — Telehealth (HOSPITAL_COMMUNITY): Payer: Self-pay

## 2018-11-17 NOTE — Telephone Encounter (Signed)
Referral signed and received from Dr. Rennis Golden for this pt to participate in Cardiac Rehab.  Due to the departmental closure based on national recommendations for Covid-19 we are unable to provide group exercise at this time. Pt is aware of the closure and the option to participate in virtual cardiac rehab from phase I rehab staff. Follow up appt on 11/26/2018.    Continue to follow pt.

## 2018-11-24 ENCOUNTER — Ambulatory Visit: Payer: Self-pay | Admitting: Family Medicine

## 2018-11-25 NOTE — Progress Notes (Signed)
Cardiology Office Note   Date:  11/26/2018   ID:  Gary Buck, DOB 1966-05-18, MRN [id number]  PCP:  Patient, No Pcp Per  Cardiologist:  Dr. Rennis GoldenHilty  No chief complaint on file.    History of Present Illness: Gary Buck is a 53 y.o. male who presents for post hospital follow up after admission for acute STEMI of the left circumflex artery, requiring emergent cardiac cath. Cath revealed single vessel CAD involving a small distal left Cx branch. The vessel was too small for intervention. LV fx 65%. He as placed on ASA and Plavix in addition to Eliquis.  He has other history of Atrial fibrillation, HTN, HL, ongoing tobacco abuse, intermittent cocaine abuse, and ETOH abuse-drinking a 5th of liquor a day, CKD which revealed creatinine elevation of 1.6 and HCTZ was discontinued, Bipolar affective disorder, Type II diabetes, and obesity.   He states he is feeling better and has abstained from illicit drug use and ETOH since leaving the hospital He is not avoiding salty foods. He has a case worker and a Charity fundraiserN working with him concerning his medications.  He does not have a way to take BP at home.  He denies chest pain, rapid HR or DOE.   Past Medical History:  Diagnosis Date  . Anginal pain (HCC)   . Atrial fibrillation (HCC) 2017  . Bipolar disorder (HCC)   . Depression   . ETOH abuse   . H/O cocaine abuse (HCC)   . Hypercholesterolemia   . Hypertension   . PAF (paroxysmal atrial fibrillation) (HCC)    [name]/notes 12/21/2014  . Stroke [facility](HCC) 2004   pt sts about 12 years ago.   . Tobacco abuse   . Type II diabetes mellitus (HCC) dx'd 12/21/2014    Past Surgical History:  Procedure Laterality Date  . CYSTECTOMY  ~ 2004   "on the top of my forehead"  . INGUINAL HERNIA REPAIR Left ~ 2012  . LEFT HEART CATH AND CORONARY ANGIOGRAPHY N/A 11/12/2018   Procedure: LEFT HEART CATH AND CORONARY ANGIOGRAPHY;  Surgeon: SwazilandJordan, Peter M, MD;  [facility];  Service: Cardiovascular;   Laterality: N/A;     Current Outpatient Medications  Medication Sig Dispense Refill  . amLODipine (NORVASC) 10 MG tablet Take 1 tablet (10 mg total) by mouth daily. 30 tablet 0  . apixaban (ELIQUIS) 5 MG TABS tablet Take 1 tablet (5 mg total) by mouth 2 (two) times daily. 60 tablet 0  . atorvastatin (LIPITOR) 80 MG tablet Take 1 tablet (80 mg total) by mouth daily at 6 PM. 90 tablet 1  . carvedilol (COREG) 12.5 MG tablet Take 1 tablet (12.5 mg total) by mouth 2 (two) times daily. 60 tablet 2  . chlorhexidine (PERIDEX) 0.12 % solution Use as directed 15 mLs in the mouth or throat 2 (two) times daily. 120 mL 0  . clopidogrel (PLAVIX) 75 MG tablet Take 1 tablet (75 mg total) by mouth daily with breakfast. 90 tablet 1  . lisinopril (ZESTRIL) 40 MG tablet Take 1 tablet (40 mg total) by mouth daily. 30 tablet 0  . metFORMIN (GLUCOPHAGE) 500 MG tablet Take 1 tablet (500 mg total) by mouth 2 (two) times daily with a meal. 60 tablet 0  . nicotine (NICODERM CQ - DOSED IN MG/24 HOURS) 14 mg/24hr patch Place 1 patch (14 mg total) onto the skin daily. 28 patch 0  . nitroGLYCERIN (NITROSTAT) 0.4 MG SL tablet Place 1 tablet (0.4 mg total) under the tongue  every 5 (five) minutes x 3 doses as needed for chest pain. 25 tablet 3  . ondansetron (ZOFRAN) 4 MG tablet Take 1 tablet (4 mg total) by mouth every 6 (six) hours. 12 tablet 0  . risperiDONE (RISPERDAL) 1 MG tablet Take 1 tablet (1 mg total) by mouth 2 (two) times daily. 60 tablet 3   No current facility-administered medications for this visit.     Allergies:   Patient has no known allergies.    Social History:  The patient  reports that he has been smoking cigarettes. He has a 32.00 pack-year smoking history. He has never used smokeless tobacco. He reports current alcohol use of about 40.0 standard drinks of alcohol per week. He reports current drug use. Drugs: Marijuana and Cocaine.   Family History:  The patient's family history includes Diabetes  Mellitus II in his mother.    ROS: All other systems are reviewed and negative. Unless otherwise mentioned in H&P    PHYSICAL EXAM: VS:  BP (!) 178/100   Pulse 69   Temp (!) 97.2 F (36.2 C)   Ht 6\' 5"  (1.956 m)   Wt 299 lb (135.6 kg)   SpO2 99%   BMI 35.46 kg/m  , BMI Body mass index is 35.46 kg/m. GEN: Well nourished, well developed, in no acute distress HEENT: normal Neck: no JVD, carotid bruits, or masses Cardiac: RRR; S4 murmur, rubs, or gallops,no edema  Respiratory:  Clear to auscultation bilaterally, normal work of breathing GI: soft, nontender, nondistended, + BS MS: no deformity or atrophy Skin: warm and dry, no rash Neuro:  Strength and sensation are intact Psych: euthymic mood, full affect   EKG: Not completed this office visit.   Recent Labs: 11/12/2018: ALT 14 11/13/2018: Hemoglobin 18.0; Platelets 277 11/14/2018: BUN 18; Creatinine, Ser 0.84; Potassium 3.6; Sodium 137    Lipid Panel    Component Value Date/Time   CHOL 174 11/13/2018 0312   CHOL 158 02/12/2017 1539   TRIG 144 11/13/2018 0312   HDL 43 11/13/2018 0312   HDL 51 02/12/2017 1539   CHOLHDL 4.0 11/13/2018 0312   VLDL 29 11/13/2018 0312   LDLCALC 102 (H) 11/13/2018 0312   LDLCALC 82 02/12/2017 1539      Wt Readings from Last 3 Encounters:  11/26/18 299 lb (135.6 kg)  11/12/18 (!) 320 lb (145.2 kg)  11/11/18 (!) 320 lb (145.2 kg)      Other studies Reviewed: Cath: 11/12/18   Mid LAD lesion is 40% stenosed.  Dist Cx lesion is 99% stenosed.  Lat 1st Mrg lesion is 70% stenosed.  Mid RCA lesion is 30% stenosed.  The left ventricular systolic function is normal.  LV end diastolic pressure is normal.  The left ventricular ejection fraction is greater than 65% by visual estimate.  There is no mitral valve regurgitation.  1. 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 This vessel is too small for PCI 2. Hyperdynamic LV function 3. Normal LVEDP   Plan: recommend medical management. I would favor 12 months of antiplatelet therapy with Plavix in addition to Eliquis which he takes for Afib.   TTE: 11/13/18  IMPRESSIONS   1. The left ventricle has normal systolic function with an ejection fraction of 60-65%. The cavity size was normal. There is mildly increased left ventricular wall thickness. Left ventricular diastolic Doppler parameters are consistent with impaired  relaxation. No evidence of left ventricular regional wall motion abnormalities. 2. The right ventricle has normal systolic  function. The cavity was normal. There is no increase in right ventricular wall thickness. 3. Right atrial size was mildly dilated. 4. No obvious wall motion abnormalities detected.  ASSESSMENT AND PLAN:  1.  CAD: Cardiac cath revealing single vessel CAD involving a small distal left Cx branch. The vessel was too small for intervention. He is being treated medically with Plavix, no ASA as he is on a DOAC, lisinopril, and statin. He is currently asymptomatic. He is given a copy of his discharge summary to present to his case worker for a court appearance.   2. Hypertension: BP is elevated despite taking medications today. I will increase coreg to 25 mg BID.  New Rx is provided. I have marked on the bottle with the 12.5 mg tablets inside to remind him to put these aside.   3. Polysubstance abuse: He has abstained from cocaine and ETOH since leaving the hospital. He is counseled on avoiding this at all costs especially with CAD and Hyperternsion. He verbalizes understanding   4. Hypercholesterolemia: He is on atorvastatin. Goal.of LDL < 70. LDL on 11/13/2026 102. Will need to recheck in 3 months.   5. Atrial fib: He does not have any symptoms of rapid HR since stopping cocaine and ETOH. He will continue Eliquis for now. Consider stopping this once he has stopped illicit drug use for 6 months.    Current medicines are reviewed at length with the  patient today.    Labs/ tests ordered today include: None   Bettey MareKathryn M. Liborio NixonLawrence DNP, ANP, AACC   11/26/2018 9:41 AM    [facility] [address] Office [phone] Fax [phone]

## 2018-11-26 ENCOUNTER — Encounter: Payer: Self-pay | Admitting: Adult Health

## 2018-11-26 ENCOUNTER — Ambulatory Visit (INDEPENDENT_AMBULATORY_CARE_PROVIDER_SITE_OTHER): Payer: Self-pay | Admitting: Adult Health

## 2018-11-26 ENCOUNTER — Other Ambulatory Visit: Payer: Self-pay

## 2018-11-26 VITALS — BP 178/100 | HR 69 | Temp 97.2°F | Ht 77.0 in | Wt 299.0 lb

## 2018-11-26 DIAGNOSIS — I2510 Atherosclerotic heart disease of native coronary artery without angina pectoris: Secondary | ICD-10-CM

## 2018-11-26 DIAGNOSIS — E7800 Pure hypercholesterolemia, unspecified: Secondary | ICD-10-CM

## 2018-11-26 DIAGNOSIS — F1910 Other psychoactive substance abuse, uncomplicated: Secondary | ICD-10-CM

## 2018-11-26 DIAGNOSIS — I480 Paroxysmal atrial fibrillation: Secondary | ICD-10-CM

## 2018-11-26 DIAGNOSIS — I10 Essential (primary) hypertension: Secondary | ICD-10-CM

## 2018-11-26 MED ORDER — CARVEDILOL 25 MG PO TABS: 25.0000 mg | ORAL_TABLET | Freq: Two times a day (BID) | ORAL | 3 refills | Status: AC

## 2018-11-26 MED FILL — CARVEDILOL 25 MG TABLET: 25 | 15 days supply | Qty: 30 | Fill #0

## 2018-11-26 NOTE — Patient Instructions (Signed)
Medication Instructions:  INCREASE CARVEDILOL 25MG  TWICE DAILY  If you need a refill on your cardiac medications before your next appointment, please call your pharmacy.  Special Instructions: OUR CARE COORDINATOR WILL BE CALLING YOU TO DISCUSS BP MACHINE  Follow-Up: You will need a follow up appointment ON 12-29-2018 WITH Pixie Casino, MD , Jory Sims, DNP, AACC  or one of the following Advanced Practice Providers on your designated Care Team: Almyra Deforest, PA-C  Fabian Sharp, PA-C     At [facility], you and your health needs are our priority.  As part of our continuing mission to provide you with exceptional heart care, we have created designated Provider Care Teams.  These Care Teams include your primary Cardiologist (physician) and Advanced Practice Providers (APPs -  Physician Assistants and Nurse Practitioners) who all work together to provide you with the care you need, when you need it.  Thank you for choosing [facility] at [facility]!!

## 2018-12-01 ENCOUNTER — Encounter (HOSPITAL_COMMUNITY): Payer: Self-pay | Admitting: Emergency Medicine

## 2018-12-01 ENCOUNTER — Emergency Department (HOSPITAL_COMMUNITY): Payer: Self-pay

## 2018-12-01 ENCOUNTER — Emergency Department (HOSPITAL_COMMUNITY)
Admission: EM | Admit: 2018-12-01 | Discharge: 2018-12-01 | Disposition: A | Discharge status: Home / Self Care | Payer: Self-pay | Attending: Emergency Medicine | Admitting: Emergency Medicine

## 2018-12-01 DIAGNOSIS — R401 Stupor: Secondary | ICD-10-CM | POA: Insufficient documentation

## 2018-12-01 DIAGNOSIS — E119 Type 2 diabetes mellitus without complications: Secondary | ICD-10-CM | POA: Insufficient documentation

## 2018-12-01 DIAGNOSIS — Z7984 Long term (current) use of oral hypoglycemic drugs: Secondary | ICD-10-CM | POA: Insufficient documentation

## 2018-12-01 DIAGNOSIS — I10 Essential (primary) hypertension: Secondary | ICD-10-CM | POA: Insufficient documentation

## 2018-12-01 DIAGNOSIS — Z7901 Long term (current) use of anticoagulants: Secondary | ICD-10-CM | POA: Insufficient documentation

## 2018-12-01 DIAGNOSIS — Z79899 Other long term (current) drug therapy: Secondary | ICD-10-CM | POA: Insufficient documentation

## 2018-12-01 DIAGNOSIS — I2510 Atherosclerotic heart disease of native coronary artery without angina pectoris: Secondary | ICD-10-CM | POA: Insufficient documentation

## 2018-12-01 DIAGNOSIS — F17210 Nicotine dependence, cigarettes, uncomplicated: Secondary | ICD-10-CM | POA: Insufficient documentation

## 2018-12-01 DIAGNOSIS — Z7902 Long term (current) use of antithrombotics/antiplatelets: Secondary | ICD-10-CM | POA: Insufficient documentation

## 2018-12-01 LAB — COMPREHENSIVE METABOLIC PANEL
ALT: 15 U/L (ref 0–44)
AST: 22 U/L (ref 15–41)
Albumin: 4.1 g/dL (ref 3.5–5.0)
Alkaline Phosphatase: 73 U/L (ref 38–126)
Anion gap: 12 (ref 5–15)
BUN: 31 mg/dL — ABNORMAL HIGH (ref 6–20)
CO2: 22 mmol/L (ref 22–32)
Calcium: 9.1 mg/dL (ref 8.9–10.3)
Chloride: 105 mmol/L (ref 98–111)
Creatinine, Ser: 1.74 mg/dL — ABNORMAL HIGH (ref 0.61–1.24)
GFR calc Af Amer: 51 mL/min — ABNORMAL LOW (ref >60)
GFR calc non Af Amer: 44 mL/min — ABNORMAL LOW (ref >60)
Glucose, Bld: 112 mg/dL — ABNORMAL HIGH (ref 70–99)
Potassium: 3.7 mmol/L (ref 3.5–5.1)
Sodium: 139 mmol/L (ref 135–145)
Total Bilirubin: 0.9 mg/dL (ref 0.3–1.2)
Total Protein: 6.9 g/dL (ref 6.5–8.1)

## 2018-12-01 LAB — CBC WITH DIFFERENTIAL/PLATELET
Abs Immature Granulocytes: 0.06 10*3/uL (ref 0.00–0.07)
Basophils Absolute: 0 10*3/uL (ref 0.0–0.1)
Basophils Relative: 0 %
Eosinophils Absolute: 0.1 10*3/uL (ref 0.0–0.5)
Eosinophils Relative: 0 %
HCT: 51.7 % (ref 39.0–52.0)
Hemoglobin: 16.5 g/dL (ref 13.0–17.0)
Immature Granulocytes: 0 %
Lymphocytes Relative: 9 %
Lymphs Abs: 1.2 10*3/uL (ref 0.7–4.0)
MCH: 31.3 pg (ref 26.0–34.0)
MCHC: 31.9 g/dL (ref 30.0–36.0)
MCV: 98.1 fL (ref 80.0–100.0)
Monocytes Absolute: 0.9 10*3/uL (ref 0.1–1.0)
Monocytes Relative: 7 %
Neutro Abs: 11.5 10*3/uL — ABNORMAL HIGH (ref 1.7–7.7)
Neutrophils Relative %: 84 %
Platelets: 260 10*3/uL (ref 150–400)
RBC: 5.27 MIL/uL (ref 4.22–5.81)
RDW: 13.1 % (ref 11.5–15.5)
WBC: 13.8 10*3/uL — ABNORMAL HIGH (ref 4.0–10.5)
nRBC: 0 % (ref 0.0–0.2)

## 2018-12-01 LAB — RAPID URINE DRUG SCREEN, HOSP PERFORMED
Amphetamines: NOT DETECTED
Barbiturates: NOT DETECTED
Benzodiazepines: POSITIVE — AB
Cocaine: NOT DETECTED
Opiates: POSITIVE — AB
Tetrahydrocannabinol: POSITIVE — AB

## 2018-12-01 LAB — CBG MONITORING, ED: Glucose-Capillary: 98 mg/dL (ref 70–99)

## 2018-12-01 LAB — ETHANOL: Alcohol, Ethyl (B): <10 mg/dL (ref <10)

## 2018-12-01 LAB — ACETAMINOPHEN LEVEL: Acetaminophen (Tylenol), Serum: <10 ug/mL — ABNORMAL LOW (ref 10–30)

## 2018-12-01 LAB — SALICYLATE LEVEL: Salicylate Lvl: <7 mg/dL (ref 2.8–30.0)

## 2018-12-01 MED ORDER — SODIUM CHLORIDE 0.9 % IV BOLUS
500.0000 mL | Freq: Once | INTRAVENOUS | Status: AC
  Administered 2018-12-01: 500 mL via INTRAVENOUS

## 2018-12-01 MED ORDER — NALOXONE HCL 2 MG/2ML IJ SOSY
1.0000 mg | PREFILLED_SYRINGE | Freq: Once | INTRAMUSCULAR | Status: AC
  Administered 2018-12-01: 09:00:00 1 mg via INTRAVENOUS
  Filled 2018-12-01: qty 2

## 2018-12-01 NOTE — Discharge Instructions (Addendum)
Please avoid use of illicit substances, drink alcohol only in moderation, and monitor your condition carefully.  Return here for concerning changes in your condition.

## 2018-12-01 NOTE — ED Triage Notes (Addendum)
Pt arrives via EMS for possible overdose. Pt's housemate called EMS after finding pt unresponsive. Fire reported to EMS that GCS of 3 initially, shallow respirations. EMS placed NPA. EMS gave 2mg  narcan intranasally. Pt aroused, able to answer name, location, but very lethargic. Pinpoint pupils. Pt with hx of cocaine, ETOH abuse, MI.

## 2018-12-01 NOTE — ED Provider Notes (Signed)
[facility] Note   CSN: [id number] Arrival date & time: 12/01/18  0703    History   Chief Complaint Chief Complaint  Patient presents with  . Drug Overdose    HPI Gary Buck is a 53 y.o. male.     HPI Patient presents via EMS after being found unresponsive by a roommate. The patient himself is somnolent, awakens to stimuli, but otherwise is essentially noninteractive. When pressed for response of what happened to bring him to the emergency room, he answers with a single word response "party". He seemingly denies pain or other complaints, though history is limited secondary to the patient's obtunded status. Level 5 caveat secondary to altered mental status. Per EMS the patient was found unresponsive, and fire department personnel found him to be bradypnea, with small pupils.  Reportedly the patient received 2 intranasal treatments of Narcan had improved respiratory rate and interactivity. He is hemodynamically unremarkable in route. Past Medical History:  Diagnosis Date  . Anginal pain (HCC)   . Atrial fibrillation (HCC) 2017  . Bipolar disorder (HCC)   . Depression   . ETOH abuse   . H/O cocaine abuse (HCC)   . Hypercholesterolemia   . Hypertension   . PAF (paroxysmal atrial fibrillation) (HCC)    [name]/notes 12/21/2014  . Stroke [facility](HCC) 2004   pt sts about 12 years ago.   . Tobacco abuse   . Type II diabetes mellitus (HCC) dx'd 12/21/2014    Patient Active Problem List   Diagnosis Date Noted  . Acute ST elevation myocardial infarction (STEMI) involving left circumflex coronary artery (HCC) 11/12/2018  . STEMI involving left circumflex coronary artery (HCC) 11/12/2018  . Mixed hyperlipidemia 06/28/2017  . Hypertension 11/18/2016  . Obesity (BMI 30-39.9) 11/18/2016  . PAF (paroxysmal atrial fibrillation) (HCC) 11/17/2016  . Hypercholesterolemia 11/17/2016  . Type II diabetes mellitus (HCC) 11/17/2016  . Bipolar disorder  (HCC) 11/17/2016  . ETOH abuse 11/17/2016  . Tobacco abuse 11/17/2016  . Bipolar affective disorder, mixed, severe (HCC) 06/28/2016  . Bipolar affective disorder, current episode mixed, without psychotic features (HCC) 06/28/2016  . Atrial fibrillation, unspecified   . Elevated troponin   . Atrial fibrillation with RVR (HCC) 12/21/2014  . Coronary artery disease involving native coronary artery of native heart without angina pectoris 12/21/2014  . Chest pain   . Essential hypertension   . Remote history of stroke   . Substance abuse [facility](HCC)     Past Surgical History:  Procedure Laterality Date  . CYSTECTOMY  ~ 2004   "on the top of my forehead"  . INGUINAL HERNIA REPAIR Left ~ 2012  . LEFT HEART CATH AND CORONARY ANGIOGRAPHY N/A 11/12/2018   Procedure: LEFT HEART CATH AND CORONARY ANGIOGRAPHY;  Surgeon: SwazilandJordan, Peter M, MD;  [facility];  Service: Cardiovascular;  Laterality: N/A;        Home Medications    Prior to Admission medications   Medication Sig Start Date End Date Taking? Authorizing Provider  amLODipine (NORVASC) 10 MG tablet Take 1 tablet (10 mg total) by mouth daily. 11/11/18   Henderly, Britni A, PA-C  apixaban (ELIQUIS) 5 MG TABS tablet Take 1 tablet (5 mg total) by mouth 2 (two) times daily. 11/11/18   Henderly, Britni A, PA-C  atorvastatin (LIPITOR) 80 MG tablet Take 1 tablet (80 mg total) by mouth daily at 6 PM. 11/14/18   Arty Baumgartneroberts, Lindsay B, NP  carvedilol (COREG) 25 MG tablet Take 1 tablet (  25 mg total) by mouth 2 (two) times daily. 11/26/18   Jodelle GrossLawrence, Kathryn M, NP  chlorhexidine (PERIDEX) 0.12 % solution Use as directed 15 mLs in the mouth or throat 2 (two) times daily. 03/11/18   Bill SalinasMorelli, Brandon A, PA-C  clopidogrel (PLAVIX) 75 MG tablet Take 1 tablet (75 mg total) by mouth daily with breakfast. 11/15/18   Laverda Pageoberts, Lindsay B, NP  lisinopril (ZESTRIL) 40 MG tablet Take 1 tablet (40 mg total) by mouth daily. 11/11/18   Henderly, Britni A, PA-C   metFORMIN (GLUCOPHAGE) 500 MG tablet Take 1 tablet (500 mg total) by mouth 2 (two) times daily with a meal. 11/11/18   Henderly, Britni A, PA-C  nicotine (NICODERM CQ - DOSED IN MG/24 HOURS) 14 mg/24hr patch Place 1 patch (14 mg total) onto the skin daily. 06/28/17   Hilty, Lisette AbuKenneth C, MD  nitroGLYCERIN (NITROSTAT) 0.4 MG SL tablet Place 1 tablet (0.4 mg total) under the tongue every 5 (five) minutes x 3 doses as needed for chest pain. 07/30/17   Hilty, Lisette AbuKenneth C, MD  ondansetron (ZOFRAN) 4 MG tablet Take 1 tablet (4 mg total) by mouth every 6 (six) hours. 09/07/18   Terrilee FilesButler, Michael C, MD  risperiDONE (RISPERDAL) 1 MG tablet Take 1 tablet (1 mg total) by mouth 2 (two) times daily. 01/21/18   Storm FriskWright, Patrick E, MD    Family History Family History  Problem Relation Age of Onset  . Diabetes Mellitus II Mother     Social History Social History   Tobacco Use  . Smoking status: Current Every Day Smoker    Packs/day: 1.00    Years: 32.00    Pack years: 32.00    Types: Cigarettes  . Smokeless tobacco: Never Used  Substance Use Topics  . Alcohol use: Yes    Alcohol/week: 40.0 standard drinks    Types: 40 Cans of beer per week  . Drug use: Yes    Types: Marijuana, Cocaine    Comment: last used crack 03/08/18, last marijuana 03/10/18     Allergies   Patient has no known allergies.   Review of Systems Review of Systems  Unable to perform ROS: Acuity of condition     Physical Exam Updated Vital Signs BP 128/84   Pulse 78   Temp 97.9 F (36.6 C) (Oral)   Resp 15   SpO2 96%   Physical Exam Vitals signs and nursing note reviewed.  Constitutional:      General: He is not in acute distress.    Appearance: He is well-developed.     Comments: Somnolent obese male awakens to minimal stimuli  HENT:     Head: Normocephalic and atraumatic.  Eyes:     Conjunctiva/sclera: Conjunctivae normal.  Cardiovascular:     Rate and Rhythm: Normal rate and regular rhythm.  Pulmonary:     Effort:  Bradypnea present. No respiratory distress.     Breath sounds: Decreased air movement present. No stridor.  Abdominal:     General: There is no distension.  Skin:    General: Skin is warm and dry.  Neurological:     Mental Status: He is alert.     Comments: Moves all extremities spontaneously, though minimally, follows commands slowly, inconsistently appropriately  Psychiatric:        Cognition and Memory: Cognition is impaired.      ED Treatments / Results  Labs (all labs ordered are listed, but only abnormal results are displayed) Labs Reviewed  COMPREHENSIVE METABOLIC PANEL - Abnormal;  Notable for the following components:      Result Value   Glucose, Bld 112 (*)    BUN 31 (*)    Creatinine, Ser 1.74 (*)    GFR calc non Af Amer 44 (*)    GFR calc Af Amer 51 (*)    All other components within normal limits  ACETAMINOPHEN LEVEL - Abnormal; Notable for the following components:   Acetaminophen (Tylenol), Serum <10 (*)    All other components within normal limits  RAPID URINE DRUG SCREEN, HOSP PERFORMED - Abnormal; Notable for the following components:   Opiates POSITIVE (*)    Benzodiazepines POSITIVE (*)    Tetrahydrocannabinol POSITIVE (*)    All other components within normal limits  CBC WITH DIFFERENTIAL/PLATELET - Abnormal; Notable for the following components:   WBC 13.8 (*)    Neutro Abs 11.5 (*)    All other components within normal limits  SALICYLATE LEVEL  ETHANOL  CBG MONITORING, ED    EKG EKG Interpretation  Date/Time:  Monday December 01 2018 07:10:03 EDT Ventricular Rate:  76 PR Interval:    QRS Duration: 99 QT Interval:  406 QTC Calculation: 457 R Axis:   -9 Text Interpretation:  Sinus rhythm Borderline prolonged PR interval LAE, consider biatrial enlargement Left ventricular hypertrophy ST-t wave abnormality No significant change since last tracing Abnormal ECG Confirmed by Carmin Muskrat [phone]) on 12/01/2018 7:22:33 AM Also confirmed by Carmin Muskrat ([id number]), editor [name] [phone])  on 12/01/2018 8:02:50 AM   Radiology Dg Chest Port 1 View  Result Date: 12/01/2018 CLINICAL DATA:  Altered mental status EXAM: PORTABLE CHEST 1 VIEW COMPARISON:  11/11/2018 chest radiograph. FINDINGS: Slightly low lung volumes. Stable cardiomediastinal silhouette with top-normal heart size. No pneumothorax. No pleural effusion. Lungs appear clear, with no acute consolidative airspace disease and no pulmonary edema. IMPRESSION: Slightly low lung volumes, with no active cardiopulmonary disease. Electronically Signed   By: Ilona Sorrel M.D.   On: 12/01/2018 08:04    Procedures Procedures (including critical care time)  Medications Ordered in ED Medications  naloxone [facility]) injection 1 mg (1 mg Intravenous Given 12/01/18 0857)  sodium chloride 0.9 % bolus 500 mL (0 mLs Intravenous Stopped 12/01/18 1024)     Initial Impression / Assessment and Plan / ED Course  I have reviewed the triage vital signs and the nursing notes.  Pertinent labs & imaging results that were available during my care of the patient were reviewed by me and considered in my medical decision making (see chart for details).       Chart review after the initial evaluation notable for recent hospitalizations as a code STEMI.  Follow-up clinic notes as below:  "Cath revealed single vessel CAD involving a small distal left Cx branch. The vessel was too small for intervention. LV fx 65%. He as placed on ASA and Plavix in addition to Eliquis."   After arrival with persistently small pupils, slight bradypnea, the patient received Narcan.  Initial EKG abnormal, but similar to prior.  9:15 AM Patient much more awake, having received Narcan. Labs notable for elevated creatinine, similar to 1 month ago. He has received IV fluids, and now that he is awake, is drinking, eating. Patient denies ongoing complaints, acknowledges partying too much last night, use of multiple illicit  substances. With generally reassuring labs aside from evidence for dehydration consistent with the patient's excessive use of illicit substances, now, following fluid resuscitation, awakening, patient is appropriate for monitoring, should he remain unremarkable, he  may be appropriate for discharge.  11:48 AM  Patient awake and alert, though he falls asleep again quickly.  Patient has been eating, drinking, has had no decompensation. With positive drug screen for opiates, benzos, some suspicion for self-induced altered mental status, overdose. Given his improvement here following Narcan, fluids, patient appropriate for discharge.  Final Clinical Impressions(s) / ED Diagnoses   Final diagnoses:  Stupor     Gerhard MunchLockwood, Dayn Barich, MD 12/01/18 1149

## 2018-12-03 ENCOUNTER — Ambulatory Visit: Payer: Self-pay | Admitting: Family Medicine

## 2018-12-08 ENCOUNTER — Telehealth: Payer: Self-pay | Admitting: Internal Medicine

## 2018-12-08 NOTE — Telephone Encounter (Signed)
Spoke with Gary Buck, the patient died in his home and they want to know if dr hilty will sign the death certificate. Will forwardto dr Debara Pickett to review and advise

## 2018-12-08 NOTE — Telephone Encounter (Signed)
New message:    Rise Paganini from [employer] service calling needing to get this patient death certifcate and she states that Jory Sims would sign. Patient is a doctor Hilty patient.

## 2018-12-09 NOTE — Telephone Encounter (Signed)
LM at number provided [facility] supervisor phone) to call back with details about death per MD request

## 2018-12-09 NOTE — Telephone Encounter (Signed)
Follow up    Gary Buck is  Calling to follow up on the death certificate for pt    Please call back

## 2018-12-09 NOTE — Telephone Encounter (Signed)
Called [employer] back- director unable to speak to me at this time, receptionist advised that they would call back to speak to someone- I advised that Eliezer Lofts was Dr.Hiltys primary nurse and that is who they should ask to speak with to get the answers that he is needing.  Will forward to make nurse aware.

## 2018-12-09 NOTE — Telephone Encounter (Signed)
Spoke with Rise Paganini from [employer]. Explained MD will sign death certificate but wanted details surrounding death. EMT contact info noted below.  EMT: [facility] Phone: [phone]

## 2018-12-09 NOTE — Telephone Encounter (Signed)
I can sign death certificate - any details surrounding the death? Was there substance abuse - he was in the ER recently for this - also had coronary history. i'm in the office tomorrow.  Dr. Lemmie Evens

## 2018-12-10 NOTE — Telephone Encounter (Signed)
MD signed death certificate. Given to [facility] w/medical records

## 2018-12-11 ENCOUNTER — Telehealth: Payer: Self-pay | Admitting: Internal Medicine

## 2018-12-11 NOTE — Telephone Encounter (Signed)
New message   Per Rise Paganini at [place] need a copy of the death certificate faxed to [phone]. Please call if you have any questions.

## 2018-12-11 NOTE — Telephone Encounter (Signed)
Regarding death certificate needing to be faxed. Thank you!

## 2018-12-17 DEATH — deceased

## 2018-12-29 ENCOUNTER — Telehealth: Payer: Self-pay | Admitting: Internal Medicine

## 2020-05-19 IMAGING — CT CT HEAD WITHOUT CONTRAST
4 series · 16 of 47 positions shown, 18 images · non-contrast
Comparison: CT scan of June 26, 2010.

CLINICAL DATA: Headache.

EXAM:
CT HEAD WITHOUT CONTRAST
TECHNIQUE: Contiguous axial images were obtained from the base of the skull
through the vertex without intravenous contrast.

[Series 3: head wo · axial · 0.51mm/px · z∈[-73,+57]mm · 7 of 36 slices shown, 9 images]
[im 5/36  brain]
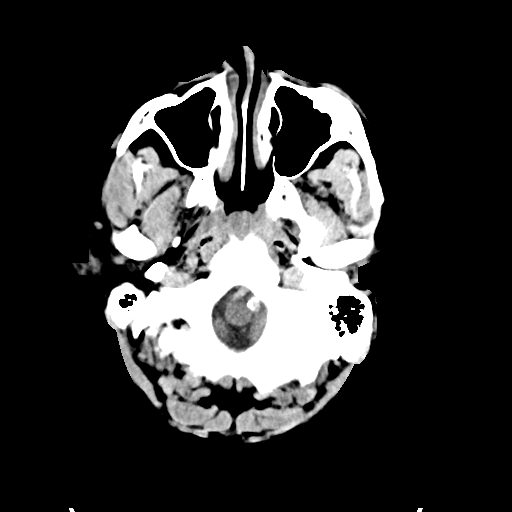
[im 5/36  bone]
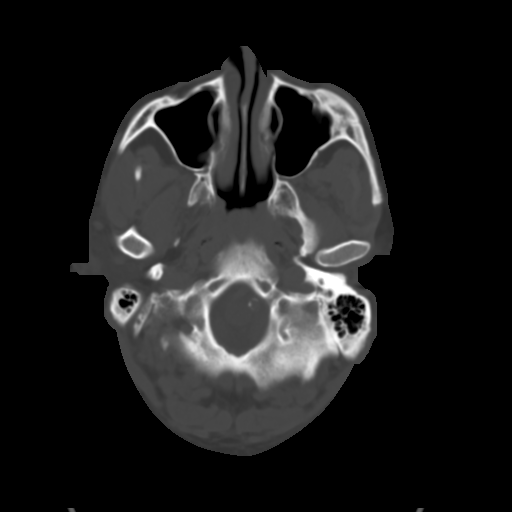
[im 9/36  brain]
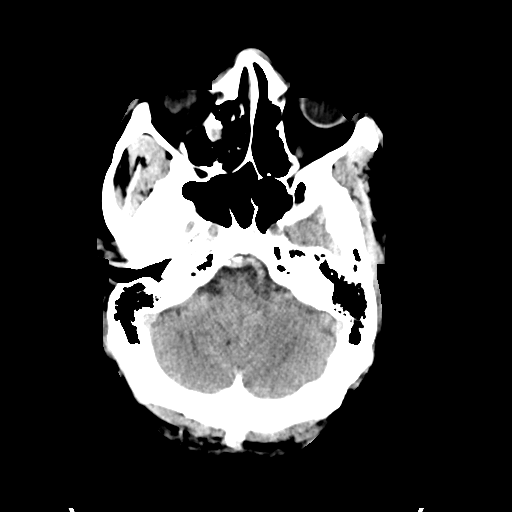
[im 14/36  brain]
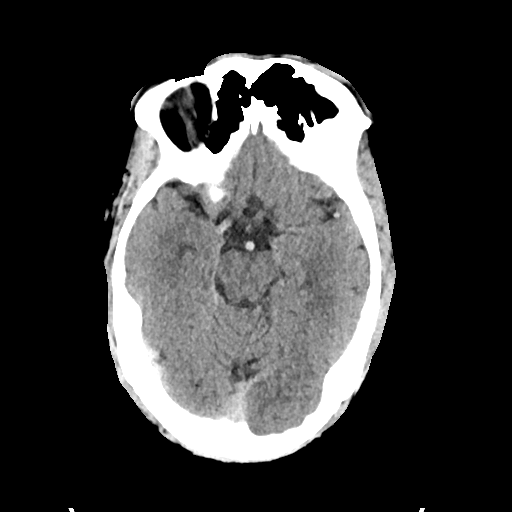
[im 18/36  brain]
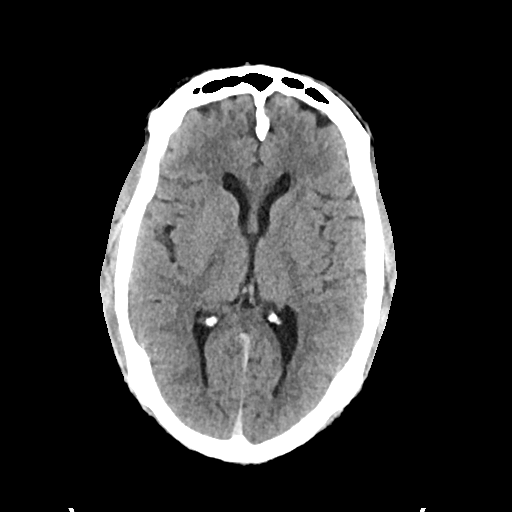
[im 22/36  brain]
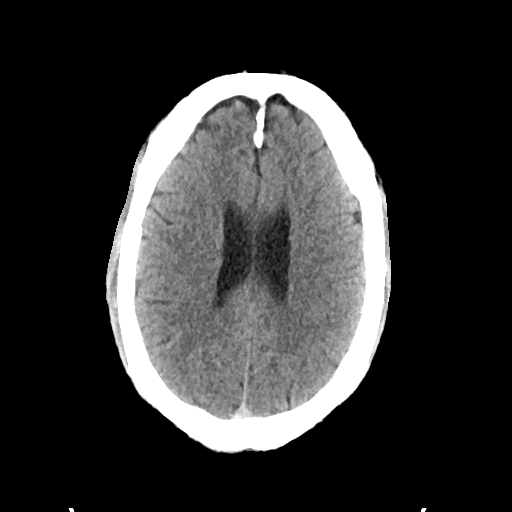
[im 22/36  bone]
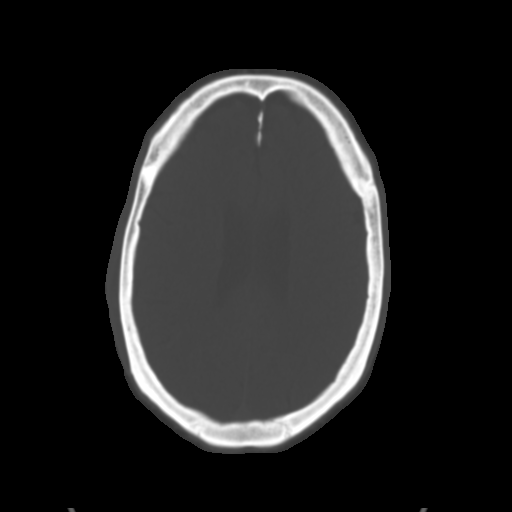
[im 27/36  brain]
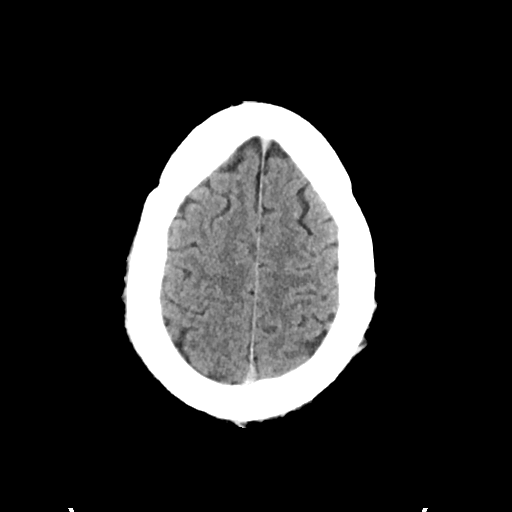
[im 31/36  brain]
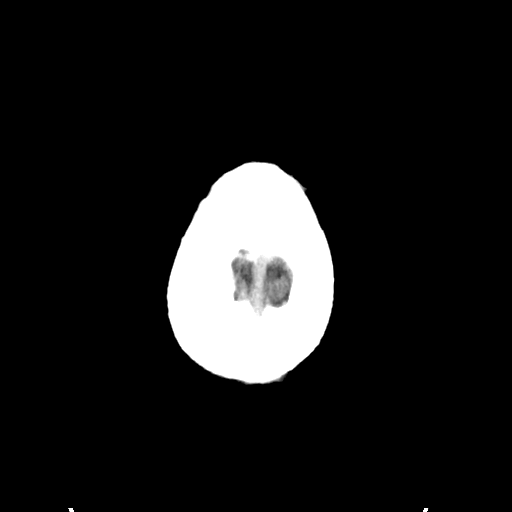

[Series 4: head bone · axial · 0.51mm/px · z∈[-77,-41]mm · 3 of 88 slices shown]
[im 9/88  bone]
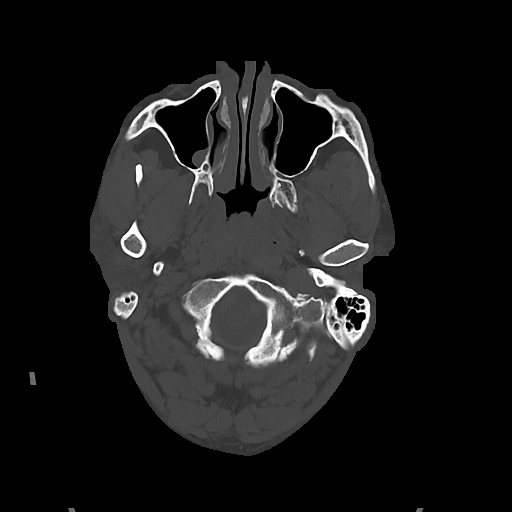
[im 18/88  bone]
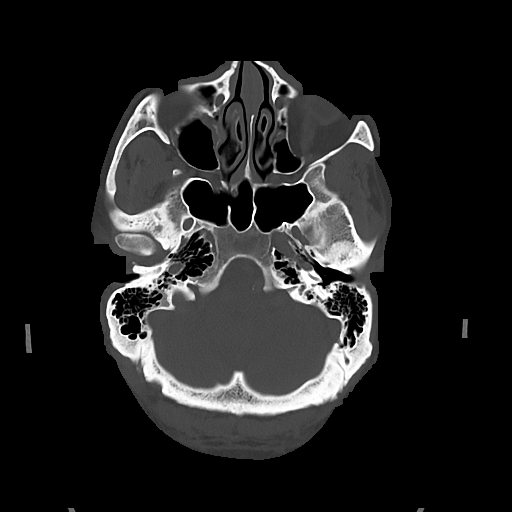
[im 27/88  bone]
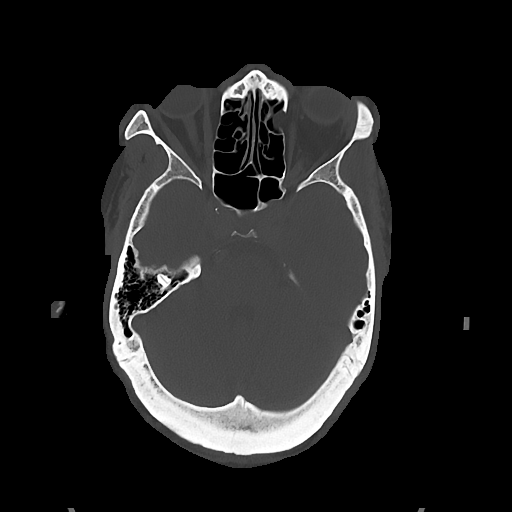

[Series 5: cor soft · coronal · 0.34mm/px · 3 of 78 slices shown]
[im 26/78  brain]
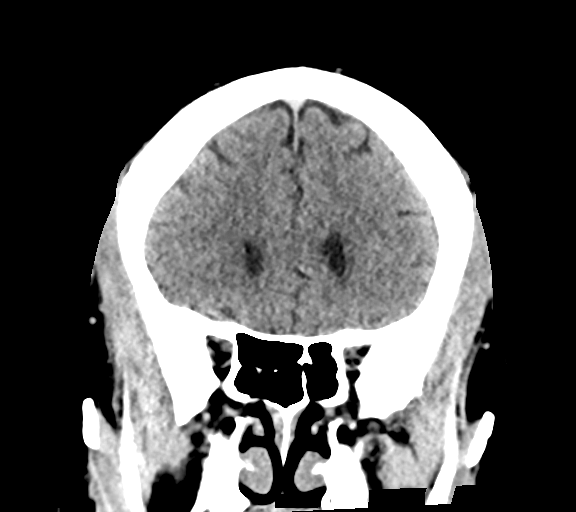
[im 35/78  brain]
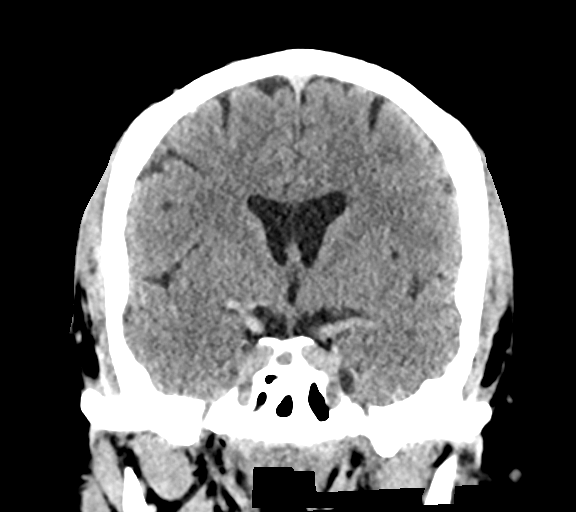
[im 43/78  brain]
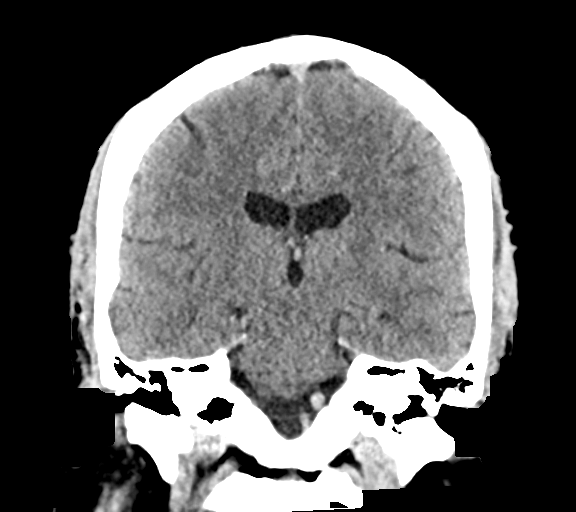

[Series 6: sag soft · sagittal · 0.34mm/px · 3 of 62 slices shown]
[im 21/62  brain]
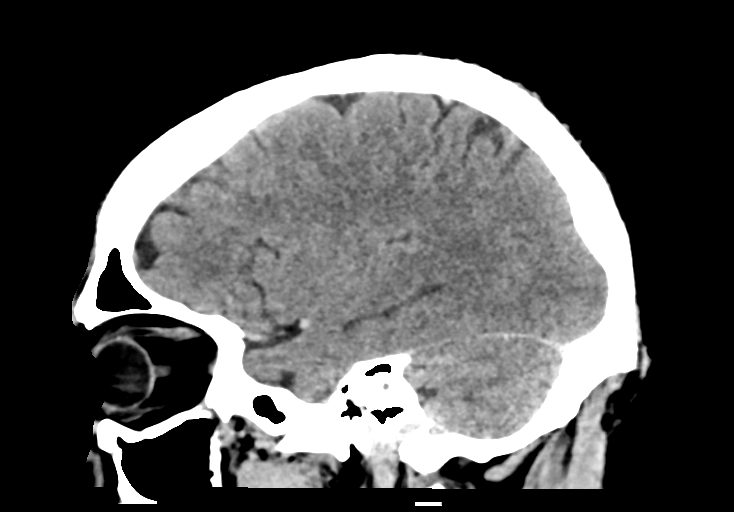
[im 31/62  brain]
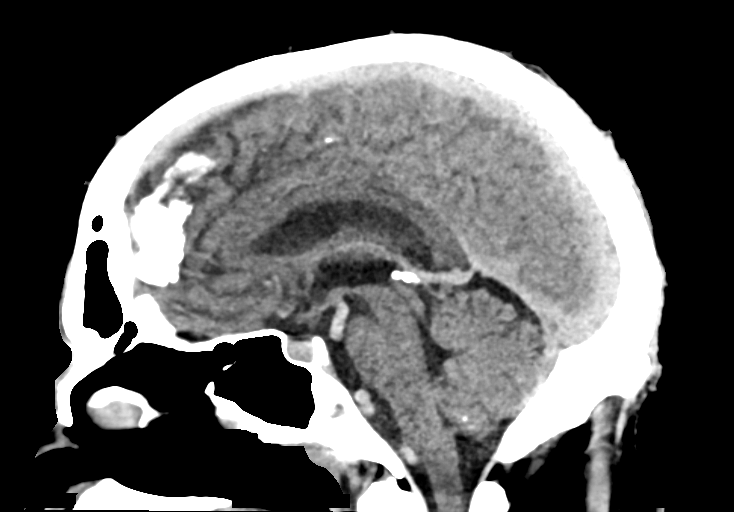
[im 41/62  brain]
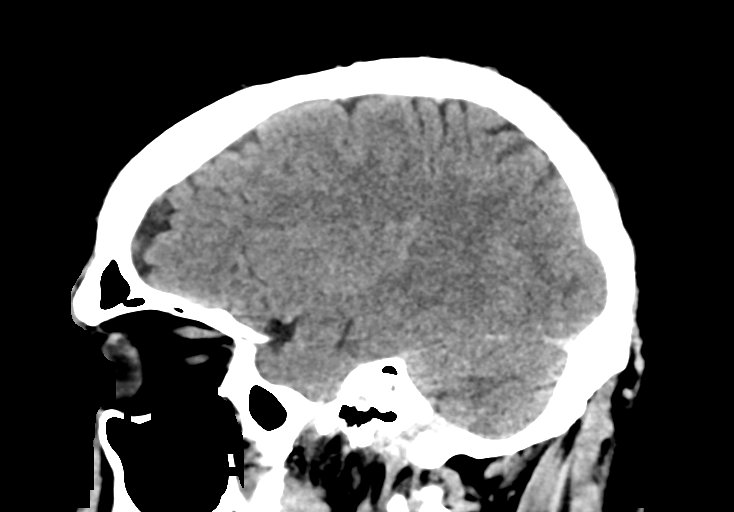

[16 of 47 positions shown; findings below may reference images not displayed]

FINDINGS: Brain: No evidence of acute infarction, hemorrhage, hydrocephalus,
extra-axial collection or mass lesion/mass effect.

Vascular: No hyperdense vessel or unexpected calcification.

Skull: Normal. Negative for fracture or focal lesion.

Sinuses/Orbits: No acute finding.

Other: None.
IMPRESSION: Normal head CT.
